# Patient Record
Sex: Female | Born: 1955 | Race: White | Hispanic: No | Marital: Married | Smoking: Never smoker
Health system: Southern US, Community
[De-identification: ages and names within clinical notes are randomized; demographics above are authoritative.]

## PROBLEM LIST (undated history)

## (undated) DIAGNOSIS — M1990 Unspecified osteoarthritis, unspecified site: Secondary | ICD-10-CM

## (undated) DIAGNOSIS — R002 Palpitations: Secondary | ICD-10-CM

## (undated) DIAGNOSIS — D649 Anemia, unspecified: Secondary | ICD-10-CM

## (undated) DIAGNOSIS — E785 Hyperlipidemia, unspecified: Secondary | ICD-10-CM

## (undated) DIAGNOSIS — G4700 Insomnia, unspecified: Secondary | ICD-10-CM

## (undated) DIAGNOSIS — Z9889 Other specified postprocedural states: Secondary | ICD-10-CM

## (undated) DIAGNOSIS — F419 Anxiety disorder, unspecified: Secondary | ICD-10-CM

## (undated) DIAGNOSIS — I10 Essential (primary) hypertension: Secondary | ICD-10-CM

## (undated) DIAGNOSIS — G43909 Migraine, unspecified, not intractable, without status migrainosus: Secondary | ICD-10-CM

## (undated) DIAGNOSIS — C50919 Malignant neoplasm of unspecified site of unspecified female breast: Secondary | ICD-10-CM

## (undated) DIAGNOSIS — E663 Overweight: Secondary | ICD-10-CM

## (undated) DIAGNOSIS — I4729 Other ventricular tachycardia: Secondary | ICD-10-CM

## (undated) DIAGNOSIS — K219 Gastro-esophageal reflux disease without esophagitis: Secondary | ICD-10-CM

## (undated) DIAGNOSIS — Z87442 Personal history of urinary calculi: Secondary | ICD-10-CM

## (undated) DIAGNOSIS — I472 Ventricular tachycardia, unspecified: Secondary | ICD-10-CM

## (undated) DIAGNOSIS — N904 Leukoplakia of vulva: Secondary | ICD-10-CM

## (undated) DIAGNOSIS — U071 COVID-19: Secondary | ICD-10-CM

## (undated) DIAGNOSIS — E119 Type 2 diabetes mellitus without complications: Secondary | ICD-10-CM

## (undated) DIAGNOSIS — G8929 Other chronic pain: Secondary | ICD-10-CM

## (undated) DIAGNOSIS — F32A Depression, unspecified: Secondary | ICD-10-CM

## (undated) DIAGNOSIS — M25519 Pain in unspecified shoulder: Secondary | ICD-10-CM

## (undated) DIAGNOSIS — F329 Major depressive disorder, single episode, unspecified: Secondary | ICD-10-CM

## (undated) DIAGNOSIS — R112 Nausea with vomiting, unspecified: Secondary | ICD-10-CM

## (undated) HISTORY — PX: CHOLECYSTECTOMY: SHX55

## (undated) HISTORY — PX: BLADDER SURGERY: SHX569

## (undated) HISTORY — PX: CARPAL TUNNEL RELEASE: SHX101

## (undated) HISTORY — PX: ORIF TIBIA & FIBULA FRACTURES: SHX2131

## (undated) HISTORY — DX: Malignant neoplasm of unspecified site of unspecified female breast: C50.919

## (undated) HISTORY — PX: TONSILLECTOMY: SUR1361

## (undated) HISTORY — PX: CATARACT EXTRACTION: SUR2

---

## 1981-07-13 DIAGNOSIS — R58 Hemorrhage, not elsewhere classified: Secondary | ICD-10-CM

## 1983-04-03 DIAGNOSIS — R58 Hemorrhage, not elsewhere classified: Secondary | ICD-10-CM

## 1998-02-06 ENCOUNTER — Ambulatory Visit (HOSPITAL_BASED_OUTPATIENT_CLINIC_OR_DEPARTMENT_OTHER): Admission: RE | Admit: 1998-02-06 | Discharge: 1998-02-06 | Payer: Self-pay | Admitting: Orthopedic Surgery

## 2000-07-23 ENCOUNTER — Encounter: Payer: Self-pay | Admitting: Urology

## 2000-07-28 ENCOUNTER — Observation Stay (HOSPITAL_COMMUNITY): Admission: RE | Admit: 2000-07-28 | Discharge: 2000-07-29 | Payer: Self-pay | Admitting: Urology

## 2000-12-08 ENCOUNTER — Ambulatory Visit: Admission: RE | Admit: 2000-12-08 | Discharge: 2000-12-08 | Payer: Self-pay | Admitting: Internal Medicine

## 2001-03-26 ENCOUNTER — Other Ambulatory Visit: Admission: RE | Admit: 2001-03-26 | Discharge: 2001-03-26 | Payer: Self-pay | Admitting: Obstetrics and Gynecology

## 2002-11-02 ENCOUNTER — Other Ambulatory Visit: Admission: RE | Admit: 2002-11-02 | Discharge: 2002-11-02 | Payer: Self-pay | Admitting: Obstetrics and Gynecology

## 2003-07-12 ENCOUNTER — Ambulatory Visit (HOSPITAL_COMMUNITY): Admission: RE | Admit: 2003-07-12 | Discharge: 2003-07-12 | Payer: Self-pay | Admitting: Obstetrics and Gynecology

## 2003-07-12 ENCOUNTER — Encounter (INDEPENDENT_AMBULATORY_CARE_PROVIDER_SITE_OTHER): Payer: Self-pay | Admitting: Specialist

## 2004-05-27 ENCOUNTER — Other Ambulatory Visit: Admission: RE | Admit: 2004-05-27 | Discharge: 2004-05-27 | Payer: Self-pay | Admitting: Obstetrics and Gynecology

## 2005-05-27 ENCOUNTER — Other Ambulatory Visit: Admission: RE | Admit: 2005-05-27 | Discharge: 2005-05-27 | Payer: Self-pay | Admitting: Obstetrics and Gynecology

## 2006-05-28 ENCOUNTER — Other Ambulatory Visit: Admission: RE | Admit: 2006-05-28 | Discharge: 2006-05-28 | Payer: Self-pay | Admitting: Obstetrics and Gynecology

## 2006-09-07 ENCOUNTER — Other Ambulatory Visit: Admission: RE | Admit: 2006-09-07 | Discharge: 2006-09-07 | Payer: Self-pay | Admitting: Obstetrics and Gynecology

## 2007-03-22 ENCOUNTER — Other Ambulatory Visit: Admission: RE | Admit: 2007-03-22 | Discharge: 2007-03-22 | Payer: Self-pay | Admitting: Obstetrics and Gynecology

## 2007-11-18 ENCOUNTER — Other Ambulatory Visit: Admission: RE | Admit: 2007-11-18 | Discharge: 2007-11-18 | Payer: Self-pay | Admitting: Obstetrics and Gynecology

## 2008-12-19 ENCOUNTER — Other Ambulatory Visit: Admission: RE | Admit: 2008-12-19 | Discharge: 2008-12-19 | Payer: Self-pay | Admitting: Obstetrics and Gynecology

## 2010-09-06 NOTE — H&P (Signed)
NAME:  Meghan Mcdonald, Meghan Mcdonald                             ACCOUNT NO.:  [id number]   MEDICAL RECORD NO.:  [id number]                   PATIENT TYPE:   LOCATION:                                       FACILITY:   PHYSICIAN:  Artist Pais, M.D.                 DATE OF BIRTH:  06/11/1955   DATE OF ADMISSION:  07/12/2003  DATE OF DISCHARGE:                                HISTORY & PHYSICAL   CURRENT HISTORY:  The patient is a 55 year old Caucasian female, gravida 2,  para 2, referred by Dr. Merril Abbe for irregular menstrual bleeding.  In the  last two years, she has skipped some periods.  She was seen by another  physician and started on __________ progesterone-only oral contraceptive.  She says that she did not really want to take this progesterone-only oral  contraceptive.  Prior to that, she has bled the entire month of July and  part of August and she was quite concerned that any type of hormone could  increase her migraines in frequency and severity.  She did discontinue  hormones, but her bleeding continued for approximately 40 days.  The patient  was advised due to her menometrorrhagia to undergo pelvic ultrasound.  She  had a pelvic ultrasound which revealed her endometrium to be 7 mm and she  was found to have a cyst in her right adnexa.  She subsequently was asked to  undergo a sonohysterogram, which showed a possible wall defect at the right  endometrium.  It was uncertain as to if this was an artifact secondary to  the angle of the endometrial cavity, but the appearance did appear to be  that of a polyp.  She was thus advised to undergo a D&C and hysteroscopy.  Of note, the patient was found to have two small fibroids.  The risks of  surgery, including anesthetic complication, hemorrhage, infection, damage to  adjacent structures, including bladder, bowel, blood vessels, and ureters  was discussed with the patient.  She was made aware of the risk of uterine  perforation which could  result in overwhelming life-threatening hemorrhage  requiring emergent hysterectomy and uterine perforation which could result  in overwhelming life-threatening peritonitis or which could result in  colostomy.  She expressed understanding of and acceptance of these risks.  She does not have any cardiac problems requiring SBE prophylaxis.  We did  obtain surgical clearance from Dr. Merril Abbe for her to have her D&C,  hysteroscopy, and removal of possible endometrial polyp.   PAST OBSTETRICAL AND GYNECOLOGICAL HISTORY:  Contraception:  Vasectomy.  Cycle Duration:  The patient is noted to have menometrorrhagia.  She has had  no abnormal Paps with the exception of inflammation on Pap.   PAST MEDICAL HISTORY:  1. GERD.  2. Migraines.  3. Hypertension.  4. History of gestational diabetes x 2.  5. History of asthma with upper respiratory infections.  6.  Hypercholesterolemia.  7. Sinus tachycardia.  8. Seasonal allergies.   ALLERGIES:  ASPIRIN causes bleeding and easy bruisability.   CURRENT MEDICATIONS:  Prevacid, Allegra, Altace, Toprol, and Zocor.   PAST SURGICAL HISTORY:  1. Bladder sling in 2002.  2. Carpal tunnel release of the right wrist in 1995.  3. Cholecystectomy in 1990.  4. Nevus removal from the inferior portion of the right breast.   FAMILY HISTORY:  There is no family history of colon, breast, ovarian, or  prostate cancer.  The patient's mother is 71 with GERD, asthma, and an  ulcer.  Her father died at 57 of heart disease.  He also had hypertension,  as well as elevated cholesterol and cholelithiasis.  One brother, age 81 is  alive and well.  One sister, age 39, is an insulin-dependent diabetic with a  history of a DVT.  Two children, ages 41 and 32, are doing well.  Her mother  did have an MI and her father had an MI with resulting bypass.  Her mother  also has a history of asthma.   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  She does not smoke.  She drinks four to five glasses  of wine weekly.   REVIEW OF SYSTEMS:  Noncontributory, except as noted above.  Denies  headache, visual changes, chest pain, shortness of breath, abdominal pain,  change in bowel habits, unintentional weight loss, dysuria, urgency,  frequency, vaginal prolapse or discharge, and pain or bleeding with  intercourse.   PHYSICAL EXAMINATION:  GENERAL APPEARANCE:  A well-developed Caucasian  female.  VITAL SIGNS:  Blood pressure 114/86, heart rate 68.  WEIGHT:  150 pounds.  HEENT:  Normal.  NECK:  Supple without thyromegaly, adenopathy, or nodules.  CHEST:  Clear to auscultation.  CARDIAC:  Regular rate and rhythm.  ABDOMEN:  Soft and nontender.  No hepatosplenomegaly or masses.  EXTREMITIES:  No cyanosis, clubbing, or edema.  NEUROLOGIC:  Oriented x 3.  Grossly normal.  PELVIC:  Normal external female genitalia.  No vulvar, vaginal, or cervical  lesions.  Pap smear was deferred as this was recently performed by another  gynecologist.  Bimanual examination reveals the uterus to be 10-12 weeks  size, globular, and consistent with possible adenomyosis.  No adnexal masses  palpated.  RECTAL:  Excellent sphincter tone.  Confirms pelvic exam.  No masses  palpated.   ASSESSMENT AND PLAN:  The patient is a 55 year old Caucasian female with a  history of menometrorrhagia and an endometrial polyp found on  sonohysterogram, admitted for D&C, hysteroscopy, and polypectomy.  The risks  have been explained to the patient.  She experiences understanding of and  acceptance of these risks and desires to proceed with surgery.  Afterward we  will make a plan with respect to managing her menometrorrhagia.                                               Artist Pais, M.D.    DC/MEDQ  D:  07/11/2003  T:  07/11/2003  Job:  [id number]   cc:   [facility]

## 2010-09-06 NOTE — Op Note (Signed)
[facility]  Patient:    Meghan Mcdonald, Meghan Mcdonald                   MRN: [id number] Proc. Date: 07/28/00 Adm. Date:  [id number] Attending:  Nelma Rothman Iii CC:         Lacretia Leigh. Quintella Reichert, M.D.   Operative Report  DIAGNOSIS:  Stress urinary incontinence.  OPERATION PERFORMED:  Placement of pubovaginal sling with Durmatrix, cystourethroscopy and placement of suprapubic tube.  SURGEON:  Gaynelle Arabian, M.D.  ANESTHESIA:  General endotracheal.  ESTIMATED BLOOD LOSS:  100 cc.  TUBES:  14 French microvasive suprapubic tube, fader tip.  COMPLICATIONS:  None.  INDICATIONS FOR PROCEDURE:  Meghan Mcdonald is a lovely 55 year old white female who presents with progressive stress urinary incontinence. She notes that over the last two years she has been wearing pads periodically but now when she coughs or ______ its worsened. She has had to wear multiple pads. She had a very large baby and participates in karate and thinks that this may have worsened the situation. She has no urgency or frequency of significance and no urge incontinence. On urodynamic evaluation, she was found to have normal bladder sensation. She had a high leak point pressure; however, on straining cystogram, it was significant since its well below the inferior border of the pubic symphysis and some beaking of the bladder neck. I think she on discussion has type 2 stress urinary incontinence but may also have a small component of sphincter incompetence. We have tried biofeedback that didnt help. Kegel exercises has corrected it ______, pharmacotherapy and other measures. After understanding risks, benefits, and alternatives, she has elected to proceed with pubovaginal sling and has elected to proceed with Durmatrix.  DESCRIPTION OF PROCEDURE:  The patient was placed in the supine position and after proper general endotracheal anesthesia was placed in the dorsal lithotomy position and prepped and  draped with Betadine in a sterile fashion. The Durmatrix sling was prepared. It was a 2 x 8 cm sling soaked in antibiotic solution and a 2 cm suprapubic incision was made and blunt dissection was carried down to the anterior rectus sheath. A vaginal speculum was placed. The vaginal mucosa was grasped with an Allis clamp and the submucosal area was injected with 10 cc of 1% xylocaine with epinephrine. A midline incision was then made after a 16 French Foley catheter had been placed and the bladder was drained and lateral flaps were created and the endopelvic fascia was perforated bilaterally. The space of Retzius was dissected free. There were no significant adhesions noted to be present and utilizing the Stamey needle, the #1 Prolenes which had been attached to the sling were passed suprapubically. The sling was tacked in the midline superiorly and inferiorly with 4-0 Vicryl suture and noted to be laying smoothly at the urethrovesical junction. The vaginal mucosa was closed with a running locked 2-0 Vicryl suture and the Foley catheter was removed. The cystoscope was placed, the sutures were then tied to themselves and then across to each other very loosely tying it so as to not put tension and the cystoscope was maintained in the urethra at the correct angle so as to not over correct. Thorough irrigation was performed with the suprapubic incision and the suprapubic incision was closed with a running subcuticular 4-0 Vicryl suture. Cystourethroscopy was then performed with 12 degree angle lens and the ACMI scope. Efflux of clear urine was noted bilaterally and a fader tipped microvasive suprapubic tube  was placed under direct vision and secured in place with the Cope mechanism. The suprapubic tube was then sutured to the skin with 3-0 nylon suture and drained. The bladder was drained and urine was clear. A vaginal pack was placed with 2 inch iodoform gauze with bacitracin ointment. The  suprapubic incision was steri-stripped with Benzoin and Steri-Strips, dressed sterilely and the patient was taken to the recovery room stable. DD:  07/28/00 TD:  07/28/00 Job: [id number]

## 2010-09-06 NOTE — Op Note (Signed)
NAME:  Meghan Mcdonald, Meghan Mcdonald                      ACCOUNT NO.:  [id number]   MEDICAL RECORD NO.:  [id number]                   PATIENT TYPE:  AMB   LOCATION:  SDC                                  FACILITY:  [facility]   PHYSICIAN:  Artist Pais, M.D.                 DATE OF BIRTH:  Sep 30, 1955   DATE OF PROCEDURE:  07/12/2003  DATE OF DISCHARGE:                                 OPERATIVE REPORT   PREOPERATIVE DIAGNOSIS:  Menometrorrhagia, endometrial polyp on  sonohysterogram.   POSTOPERATIVE DIAGNOSIS:  Menometrorrhagia, endometrial polyp on  sonohysterogram.   PROCEDURE:  Dilation and curettage, hysteroscopy, polypectomy.   SURGEON:  Artist Pais, M.D.   ANESTHESIA:  General by LMA plus 20 mL of 1% lidocaine paracervical block.   FLUIDS REPLACED:  1000 mL crystalloid.   DRAINS:  None.   COMPLICATIONS:  None.   ESTIMATED BLOOD LOSS:  Minimal.   URINE OUTPUT:  The patient had very minimal urine output on straight cath  because she had urinated prior to coming to the OR.   DESCRIPTION OF PROCEDURE:  The patient was brought to the operating room and  identified on the operating table.  After induction of adequate general  anesthesia by LMA, the patient was placed in the dorsal lithotomy position  and prepped and draped in the usual sterile fashion.  A bimanual examination  was performed after a straight cath of the bladder was performed which  yielded only a few drops of urine as she had urinated prior to coming to the  OR.  The uterus was noted to be mid position and normal size, approximately  six weeks.  The speculum was placed and the anterior lip of the cervix was  infiltrated with 1% lidocaine, 1 mL.  It was then grasped with a single  tooth tenaculum.  The remaining 19 mL of 1% lidocaine were placed for a  paracervical block.  The uterus sounded to 6.5 to 7 cm.  Subsequently, the  cervix was dilated very gently up to a #23 Pratt dilator, dilatation  proceeded very carefully  and smoothly to decrease the risk of uterine  perforation.  The os was noted to track to the left, but dilation proceeded  quite easily.  The ACMI hysteroscope, and using Sorbitol as a distending  medium, a careful and thorough hysteroscopic examination was performed.  Both tubal ostia were identified, there was noted to be copious thickened  endometrium and the endometrial polyp was again identified.  Subsequently,  the scope was withdrawn and  using the serrated curet, the uterus was  curetted in a systematic clockwise fashion with the polyp and copious tissue  obtained.  The Randall stone forceps were placed and additional tissue was  obtained.  I placed the scope again and the endometrium was noted to have  been completely sampled, the polyp was noted to be removed in its entirety.  The uterus had been curetted  until a good crie was heard all around.  At  that point, the procedure was terminated.  The tenaculum was removed, there  was no bleeding from the tenaculum site.  The patient was subsequently  transferred to the recovery room in stable condition after instrument,  sponge, and needle counts were  correct.  She was given a post D&C instruction sheet.  She is to call the  office for any problems.  She is to take either ibuprofen if she can  tolerate or Tylenol as indicated on the sheet for pain.  However, I did not  give her Toradol as she says that aspirin causes bruising.  She is to return  for a postop visit in two weeks.                                               Artist Pais, M.D.    DC/MEDQ  D:  07/12/2003  T:  07/13/2003  Job:  [id number]   cc:   Ike Bene, M.D.  [address]  [place]  [place] [address]  Fax: [phone]

## 2014-10-05 ENCOUNTER — Other Ambulatory Visit: Payer: Self-pay | Admitting: Family Medicine

## 2014-10-05 DIAGNOSIS — J3489 Other specified disorders of nose and nasal sinuses: Secondary | ICD-10-CM

## 2014-10-05 DIAGNOSIS — R221 Localized swelling, mass and lump, neck: Secondary | ICD-10-CM

## 2014-10-18 ENCOUNTER — Other Ambulatory Visit: Payer: Self-pay

## 2014-10-18 ENCOUNTER — Ambulatory Visit
Admission: RE | Admit: 2014-10-18 | Discharge: 2014-10-18 | Disposition: A | Discharge status: Home / Self Care | Payer: BLUE CROSS/BLUE SHIELD | Source: Ambulatory Visit | Attending: Family Medicine | Admitting: Family Medicine

## 2014-10-18 DIAGNOSIS — R221 Localized swelling, mass and lump, neck: Secondary | ICD-10-CM

## 2014-10-18 DIAGNOSIS — J3489 Other specified disorders of nose and nasal sinuses: Secondary | ICD-10-CM

## 2014-10-18 MED ORDER — IOPAMIDOL (ISOVUE-300) INJECTION 61%
75.0000 mL | Freq: Once | INTRAVENOUS | Status: AC | PRN
  Administered 2014-10-18: 75 mL via INTRAVENOUS

## 2015-08-22 ENCOUNTER — Other Ambulatory Visit: Payer: Self-pay | Admitting: Obstetrics & Gynecology

## 2016-09-17 ENCOUNTER — Other Ambulatory Visit (HOSPITAL_COMMUNITY)
Admission: RE | Admit: 2016-09-17 | Discharge: 2016-09-17 | Disposition: A | Discharge status: Home / Self Care | Payer: BLUE CROSS/BLUE SHIELD | Source: Ambulatory Visit | Attending: Obstetrics & Gynecology | Admitting: Obstetrics & Gynecology

## 2016-09-17 ENCOUNTER — Other Ambulatory Visit: Payer: Self-pay | Admitting: Obstetrics & Gynecology

## 2016-09-17 DIAGNOSIS — Z1151 Encounter for screening for human papillomavirus (HPV): Secondary | ICD-10-CM | POA: Insufficient documentation

## 2016-09-17 DIAGNOSIS — Z01419 Encounter for gynecological examination (general) (routine) without abnormal findings: Secondary | ICD-10-CM | POA: Diagnosis present

## 2016-09-19 LAB — CYTOLOGY - PAP
Diagnosis: NEGATIVE
HPV: NOT DETECTED

## 2017-06-04 DIAGNOSIS — H1045 Other chronic allergic conjunctivitis: Secondary | ICD-10-CM | POA: Diagnosis not present

## 2017-06-16 DIAGNOSIS — N183 Chronic kidney disease, stage 3 (moderate): Secondary | ICD-10-CM | POA: Diagnosis not present

## 2017-06-16 DIAGNOSIS — E7800 Pure hypercholesterolemia, unspecified: Secondary | ICD-10-CM | POA: Diagnosis not present

## 2017-06-16 DIAGNOSIS — G43909 Migraine, unspecified, not intractable, without status migrainosus: Secondary | ICD-10-CM | POA: Diagnosis not present

## 2017-06-16 DIAGNOSIS — I10 Essential (primary) hypertension: Secondary | ICD-10-CM | POA: Diagnosis not present

## 2017-07-20 DIAGNOSIS — M25511 Pain in right shoulder: Secondary | ICD-10-CM | POA: Diagnosis not present

## 2017-09-30 DIAGNOSIS — Z01411 Encounter for gynecological examination (general) (routine) with abnormal findings: Secondary | ICD-10-CM | POA: Diagnosis not present

## 2017-09-30 DIAGNOSIS — G5602 Carpal tunnel syndrome, left upper limb: Secondary | ICD-10-CM | POA: Diagnosis not present

## 2017-09-30 DIAGNOSIS — N7689 Other specified inflammation of vagina and vulva: Secondary | ICD-10-CM | POA: Diagnosis not present

## 2017-11-04 DIAGNOSIS — G5602 Carpal tunnel syndrome, left upper limb: Secondary | ICD-10-CM | POA: Diagnosis not present

## 2017-11-06 DIAGNOSIS — G5602 Carpal tunnel syndrome, left upper limb: Secondary | ICD-10-CM | POA: Diagnosis not present

## 2017-11-12 DIAGNOSIS — M79671 Pain in right foot: Secondary | ICD-10-CM | POA: Diagnosis not present

## 2017-11-12 DIAGNOSIS — M722 Plantar fascial fibromatosis: Secondary | ICD-10-CM | POA: Diagnosis not present

## 2017-11-12 DIAGNOSIS — M24811 Other specific joint derangements of right shoulder, not elsewhere classified: Secondary | ICD-10-CM | POA: Diagnosis not present

## 2017-11-18 ENCOUNTER — Encounter: Payer: Self-pay | Admitting: Physical Therapy

## 2017-11-18 ENCOUNTER — Other Ambulatory Visit: Payer: Self-pay

## 2017-11-18 ENCOUNTER — Ambulatory Visit: Payer: BLUE CROSS/BLUE SHIELD | Attending: Family Medicine | Admitting: Physical Therapy

## 2017-11-18 DIAGNOSIS — M79671 Pain in right foot: Secondary | ICD-10-CM | POA: Insufficient documentation

## 2017-11-18 NOTE — Therapy (Addendum)
[facility] [place], [place], [address] Phone: [phone]   Fax:  [phone]  Physical Therapy Evaluation  Patient Details  Name: Meghan Mcdonald MRN: [id number] Date of Birth: 04-Dec-1955 Referring Provider: Rhina Brackett MD.   Encounter Date: 11/18/2017  PT End of Session - 11/18/17 0927    Visit Number  1    Number of Visits  12    Date for PT Re-Evaluation  12/30/17    PT Start Time  0902    PT Stop Time  0950    PT Time Calculation (min)  48 min    Activity Tolerance  Patient tolerated treatment well    Behavior During Therapy  [facility] for tasks assessed/performed       History reviewed. No pertinent past medical history.  History reviewed. No pertinent surgical history.  There were no vitals filed for this visit.   Subjective Assessment - 11/18/17 0956    Subjective  The patient has ongoing right heel pain.  Pain out of boot is a 7/10 which she came out on her own recently.  Her pain in boot at rest is a 2/10 described as a dull ache.      Pertinent History  Previous right Achilles pain/strain.    How long can you walk comfortably?  Can walk with CAM boot.    Patient Stated Goals  Walk without boot without pain.    Currently in Pain?  Yes    Pain Score  2     Pain Location  Heel    Pain Orientation  Right    Pain Descriptors / Indicators  Dull;Aching    Pain Onset  More than a month ago    Pain Frequency  Constant    Aggravating Factors   Walking barefoot.    Pain Relieving Factors  Resting in boot.         [facility] PT Assessment - 11/18/17 0001      Assessment   Medical Diagnosis  Right plantar fasciitis/retrocalcaneal bursitis.    Referring Provider  Rhina Brackett MD.    Onset Date/Surgical Date  -- Ongoing.      Precautions   Precautions  None      Restrictions   Weight Bearing Restrictions  No      Balance Screen   Has the patient fallen in the past 6 months  No    Has the patient had a  decrease in activity level because of a fear of falling?   No    Is the patient reluctant to leave their home because of a fear of falling?   No      Home Film/video editor residence      Prior Function   Level of Independence  Independent      ROM / Strength   AROM / PROM / Strength  AROM;Strength      AROM   Overall AROM Comments  Right ankle dorsiflexion with knee in full extension= 5 degrees and with knee flexed 10 degrees.      Strength   Overall Strength Comments  Normal right ankle/foot strength.      Palpation   Palpation comment  Tender at right Calcaneal attachment of Achilles tendon and over medial calcaneal tubercle.      Ambulation/Gait   Gait Comments  Gait in right CAM boot.                Objective measurements completed on  examination: See above findings.      [facility] Adult PT Treatment/Exercise - 11/18/17 0001      Modalities   Modalities  Electrical Stimulation;Vasopneumatic      Electrical Stimulation   Electrical Stimulation Location  Right distal Achilles and medial heel area (plantar fascia).    Electrical Stimulation Action  Pre-mod    Electrical Stimulation Parameters  80-150 Hz x 15 minutes.    Electrical Stimulation Goals  Pain      Vasopneumatic   Number Minutes Vasopneumatic   15 minutes    Vasopnuematic Location   -- Right foot/heel.    Vasopneumatic Pressure  Medium             PT Education - 11/18/17 1248    Education Details  Instruct also in short duration ice massage.          PT Long Term Goals - 11/18/17 1154      PT LONG TERM GOAL #1   Title  Independent with an HEP.    Time  6    Period  Weeks    Status  New      PT LONG TERM GOAL #2   Title  Increase right ankle dorsiflexion to 8-10 degrees to normalize the patient's gait pattern.    Time  6    Period  Weeks    Status  New      PT LONG TERM GOAL #3   Title  Walk a community distance with pain not > 2-3/10.    Time  6     Period  Weeks    Status  New             Plan - 11/18/17 1149    Clinical Impression Statement  The patient presents to OPPT with c/o right heel pain.  She is tender to palpation over her distal Achilles and medial calcaneal tubercle area.  She has a minimal loss of dorsiflexion.  She requires a CAM boot to walk with miniaml pain.  Patient will benefit from skilled physical therapy intervention to address deficits.    History and Personal Factors relevant to plan of care:  H/o right Achilles pain/strain.    Clinical Presentation  Evolving    Clinical Presentation due to:  Not improving.    Clinical Decision Making  Low    Rehab Potential  Excellent    PT Frequency  2x / week    PT Duration  6 weeks    PT Treatment/Interventions  ADLs/Self Care Home Management;Cryotherapy;Electrical Stimulation;Ultrasound;Moist Heat;Therapeutic activities;Therapeutic exercise;Patient/family education;Manual techniques;Passive range of motion;Vasopneumatic Device    PT Next Visit Plan  Pulsed combo e'stim/U/S to patient's right heel; IASTM to heel, plantar fascia and calf; Rockerboard; vaso and e'stim.    Consulted and Agree with Plan of Care  Patient       Patient will benefit from skilled therapeutic intervention in order to improve the following deficits and impairments:  Decreased activity tolerance, Pain, Decreased range of motion  Visit Diagnosis: Pain in right foot - Plan: PT plan of care cert/re-cert     Problem List There are no active problems to display for this patient.   Lindsee Labarre, [place] MPT 11/18/2017, 12:48 PM  [facility] [address] [place], [place], [address] Phone: [phone]   Fax:  [phone]  Name: Meghan Mcdonald MRN: [id number] Date of Birth: May 13, 1955

## 2017-11-19 DIAGNOSIS — M25511 Pain in right shoulder: Secondary | ICD-10-CM | POA: Diagnosis not present

## 2017-11-24 ENCOUNTER — Ambulatory Visit: Payer: BLUE CROSS/BLUE SHIELD | Attending: Family Medicine | Admitting: Physical Therapy

## 2017-11-24 ENCOUNTER — Encounter: Payer: Self-pay | Admitting: Physical Therapy

## 2017-11-24 DIAGNOSIS — M79671 Pain in right foot: Secondary | ICD-10-CM | POA: Insufficient documentation

## 2017-11-24 NOTE — Therapy (Signed)
[facility] [place], [place], [address] Phone: [phone]   Fax:  [phone]  Physical Therapy Treatment  Patient Details  Name: Meghan Mcdonald MRN: [id number] Date of Birth: 05/15/1955 Referring Provider: Rhina Brackett MD.   Encounter Date: 11/24/2017  PT End of Session - 11/24/17 1658    Visit Number  2    Number of Visits  12    Date for PT Re-Evaluation  12/30/17    PT Start Time  1602    PT Stop Time  1645    PT Time Calculation (min)  43 min    Activity Tolerance  Patient tolerated treatment well    Behavior During Therapy  [facility] for tasks assessed/performed       History reviewed. No pertinent past medical history.  History reviewed. No pertinent surgical history.  There were no vitals filed for this visit.  Subjective Assessment - 11/24/17 1657    Subjective  Reports she woke with extreme vertigo but her foot is a little better.     Pertinent History  Previous right Achilles pain/strain.    How long can you walk comfortably?  Can walk with CAM boot.    Patient Stated Goals  Walk without boot without pain.    Currently in Pain?  Yes    Pain Score  1     Pain Location  Foot    Pain Orientation  Right    Pain Descriptors / Indicators  Aching    Pain Onset  More than a month ago         [facility] PT Assessment - 11/24/17 0001      Assessment   Medical Diagnosis  Right plantar fasciitis/retrocalcaneal bursitis.      Precautions   Precautions  None      Restrictions   Weight Bearing Restrictions  No                   [facility] Adult PT Treatment/Exercise - 11/24/17 0001      Modalities   Modalities  Electrical Stimulation;Vasopneumatic;Ultrasound      Electrical Stimulation   Electrical Stimulation Location  R PF, posterior calcaneus    Electrical Stimulation Action  Pre-Mod    Electrical Stimulation Parameters  80-150 hz x15 min    Electrical Stimulation Goals  Pain      Ultrasound   Ultrasound Location  R PF, heel    Ultrasound Parameters  1.2 w/cm2, 100%, 1 mhz x10 min    Ultrasound Goals  Pain      Vasopneumatic   Number Minutes Vasopneumatic   15 minutes    Vasopnuematic Location   Ankle    Vasopneumatic Pressure  Low    Vasopneumatic Temperature   34      Manual Therapy   Manual Therapy  Myofascial release    Myofascial Release  IASTW to R PF, heel, achilles tendon to reduce adhesions and promote healing                  PT Long Term Goals - 11/18/17 1154      PT LONG TERM GOAL #1   Title  Independent with an HEP.    Time  6    Period  Weeks    Status  New      PT LONG TERM GOAL #2   Title  Increase right ankle dorsiflexion to 8-10 degrees to normalize the patient's gait pattern.    Time  6  Period  Weeks    Status  New      PT LONG TERM GOAL #3   Title  Walk a community distance with pain not > 2-3/10.    Time  6    Period  Weeks    Status  New            Plan - 11/24/17 1701    Clinical Impression Statement  Patient tolerated today's treatment well with minimal R ankle pain upon arrival. Patient able to tolerate all parts of PT session without complaint of pain only of tenderness with IASTW. Patient's R ankle placed into DF for stretch during IASTW to R PF. Redness response noted with IASTW to R achilles and distal gastrocnemius. Patient educated that soreness may present due to positive IASTW response. Normal modalities response noted following removal of the modalities.    Rehab Potential  Excellent    PT Frequency  2x / week    PT Duration  6 weeks    PT Treatment/Interventions  ADLs/Self Care Home Management;Cryotherapy;Electrical Stimulation;Ultrasound;Moist Heat;Therapeutic activities;Therapeutic exercise;Patient/family education;Manual techniques;Passive range of motion;Vasopneumatic Device    PT Next Visit Plan  Pulsed combo e'stim/U/S to patient's right heel; IASTM to heel, plantar fascia and calf; Rockerboard; vaso  and e'stim.    Consulted and Agree with Plan of Care  Patient       Patient will benefit from skilled therapeutic intervention in order to improve the following deficits and impairments:  Decreased activity tolerance, Pain, Decreased range of motion  Visit Diagnosis: Pain in right foot     Problem List There are no active problems to display for this patient.   Standley Brooking, PTA 11/24/2017, 5:11 PM  [facility] [place], [place], [address] Phone: [phone]   Fax:  [phone]  Name: Meghan Mcdonald MRN: [id number] Date of Birth: 05-Apr-1956

## 2017-11-26 ENCOUNTER — Encounter: Payer: BLUE CROSS/BLUE SHIELD | Admitting: Physical Therapy

## 2017-11-26 DIAGNOSIS — M24811 Other specific joint derangements of right shoulder, not elsewhere classified: Secondary | ICD-10-CM | POA: Diagnosis not present

## 2017-11-26 DIAGNOSIS — M25511 Pain in right shoulder: Secondary | ICD-10-CM | POA: Diagnosis not present

## 2017-11-27 ENCOUNTER — Ambulatory Visit: Payer: BLUE CROSS/BLUE SHIELD | Admitting: Physical Therapy

## 2017-11-27 ENCOUNTER — Encounter: Payer: Self-pay | Admitting: Physical Therapy

## 2017-11-27 DIAGNOSIS — M79671 Pain in right foot: Secondary | ICD-10-CM | POA: Diagnosis not present

## 2017-11-27 NOTE — Therapy (Signed)
[facility] [place], [place], [address] Phone: [phone]   Fax:  [phone]  Physical Therapy Treatment  Patient Details  Name: Meghan Mcdonald MRN: [id number] Date of Birth: [birth year] Referring Provider: Rhina Brackett MD.   Encounter Date: 11/27/2017  PT End of Session - 11/27/17 0938    Visit Number  3    Number of Visits  12    Date for PT Re-Evaluation  12/30/17    PT Start Time  0902    PT Stop Time  0947    PT Time Calculation (min)  45 min    Activity Tolerance  Patient tolerated treatment well    Behavior During Therapy  [facility] for tasks assessed/performed       History reviewed. No pertinent past medical history.  History reviewed. No pertinent surgical history.  There were no vitals filed for this visit.  Subjective Assessment - 11/27/17 0937    Subjective  Patient reports no vertigo symptoms for 3 days but swimmy headed sensation reported during today's treatment. Reports that her foot feels better though.    Pertinent History  Previous right Achilles pain/strain.    How long can you walk comfortably?  Can walk with CAM boot.    Patient Stated Goals  Walk without boot without pain.    Currently in Pain?  Yes    Pain Score  1     Pain Location  Heel    Pain Orientation  Right    Pain Descriptors / Indicators  Discomfort    Pain Onset  More than a month ago         [facility] PT Assessment - 11/27/17 0001      Assessment   Medical Diagnosis  Right plantar fasciitis/retrocalcaneal bursitis.      Precautions   Precautions  None      Restrictions   Weight Bearing Restrictions  No                   [facility] Adult PT Treatment/Exercise - 11/27/17 0001      Modalities   Modalities  Electrical Stimulation;Vasopneumatic;Ultrasound      Electrical Stimulation   Electrical Stimulation Location  R PF, posterior calcaneus    Electrical Stimulation Action  Pre-Mod    Electrical Stimulation  Parameters  80-150 hz x15 min    Electrical Stimulation Goals  Pain      Ultrasound   Ultrasound Location  R PF, heel    Ultrasound Parameters  1.5 w/cm2, 100%,1 mhz x10 min    Ultrasound Goals  Pain      Vasopneumatic   Number Minutes Vasopneumatic   15 minutes    Vasopnuematic Location   Ankle    Vasopneumatic Pressure  Medium    Vasopneumatic Temperature   34      Manual Therapy   Manual Therapy  Myofascial release    Myofascial Release  IASTW to R PF, heel, achilles tendon to reduce adhesions and promote healing                  PT Long Term Goals - 11/27/17 1030      PT LONG TERM GOAL #1   Title  Independent with an HEP.    Time  6    Period  Weeks    Status  On-going      PT LONG TERM GOAL #2   Title  Increase right ankle dorsiflexion to 8-10 degrees to normalize the patient's gait  pattern.    Time  6    Period  Weeks    Status  On-going      PT LONG TERM GOAL #3   Title  Walk a community distance with pain not > 2-3/10.    Time  6    Period  Weeks    Status  On-going            Plan - 11/27/17 1023    Clinical Impression Statement  Patient presented in clinic today with less tenderness to R heel. Patient able to tolerate treatment with reports of less tenderness and overall R heel pain. Patient noted improved R ankle DF as well. Good redness response noted with IASTW to R achilles tendon today. Patient required to stop treatment temporarily secondary to dizziness that occured in supine. Patient was transfered to sitting from supine and cold towel applied to the base of her neck. Patient's symptoms dissipated and she returned to supine with cold towel still applied to base of her neck. Normal modalities response noted following removal of the modalities.    Rehab Potential  Excellent    PT Frequency  2x / week    PT Duration  6 weeks    PT Treatment/Interventions  ADLs/Self Care Home Management;Cryotherapy;Electrical Stimulation;Ultrasound;Moist  Heat;Therapeutic activities;Therapeutic exercise;Patient/family education;Manual techniques;Passive range of motion;Vasopneumatic Device    PT Next Visit Plan  Assess R ankle DF measurement next treatment.    Consulted and Agree with Plan of Care  Patient       Patient will benefit from skilled therapeutic intervention in order to improve the following deficits and impairments:  Decreased activity tolerance, Pain, Decreased range of motion  Visit Diagnosis: Pain in right foot     Problem List There are no active problems to display for this patient.   Standley Brooking, PTA 11/27/2017, 10:30 AM  [facility] [address] [place], [place], [address] Phone: [phone]   Fax:  [phone]  Name: Meghan Mcdonald MRN: [id number] Date of Birth: 01-22-1956

## 2017-12-01 ENCOUNTER — Ambulatory Visit: Payer: BLUE CROSS/BLUE SHIELD | Admitting: Physical Therapy

## 2017-12-01 ENCOUNTER — Encounter: Payer: Self-pay | Admitting: Physical Therapy

## 2017-12-01 DIAGNOSIS — M79671 Pain in right foot: Secondary | ICD-10-CM | POA: Diagnosis not present

## 2017-12-01 NOTE — Therapy (Addendum)
[facility] [place], [place], [address] Phone: [phone]   Fax:  [phone]  Physical Therapy Treatment  Patient Details  Name: Meghan Mcdonald MRN: [id number] Date of Birth: 10-19-1955 Referring Provider: Rhina Brackett MD.   Encounter Date: 12/01/2017  PT End of Session - 12/01/17 1154    Visit Number  4    Number of Visits  12    Date for PT Re-Evaluation  12/30/17    PT Start Time  0902    PT Stop Time  0959    PT Time Calculation (min)  57 min    Activity Tolerance  Patient tolerated treatment well    Behavior During Therapy  [facility] for tasks assessed/performed       History reviewed. No pertinent past medical history.  History reviewed. No pertinent surgical history.  There were no vitals filed for this visit.  Subjective Assessment - 12/01/17 1206    Subjective  I'm doing much better but if im out of the boot too long my pain starts increasing.    Pertinent History  Previous right Achilles pain/strain.    How long can you walk comfortably?  Can walk with CAM boot.    Patient Stated Goals  Walk without boot without pain.    Currently in Pain?  Yes    Pain Score  1     Pain Location  Heel    Pain Orientation  Right    Pain Descriptors / Indicators  Discomfort    Pain Onset  More than a month ago                       [facility] Adult PT Treatment/Exercise - 12/01/17 0001      Modalities   Modalities  Electrical Stimulation      Electrical Stimulation   Electrical Stimulation Location  Right post heel.    Electrical Stimulation Action  Pre-mod.    Electrical Stimulation Parameters  80-150 Hz x 20 minutes.    Electrical Stimulation Goals  Pain      Vasopneumatic   Number Minutes Vasopneumatic   20 minutes    Vasopnuematic Location   --   Right ankle.   Vasopneumatic Pressure  Medium      Manual Therapy   Manual Therapy  Soft tissue mobilization    Soft tissue mobilization  In prone:   STW/M to right calf, Achilles and plantart fascia x 23 minutes which included IASTM.                  PT Long Term Goals - 11/27/17 1030      PT LONG TERM GOAL #1   Title  Independent with an HEP.    Time  6    Period  Weeks    Status  On-going      PT LONG TERM GOAL #2   Title  Increase right ankle dorsiflexion to 8-10 degrees to normalize the patient's gait pattern.    Time  6    Period  Weeks    Status  On-going      PT LONG TERM GOAL #3   Title  Walk a community distance with pain not > 2-3/10.    Time  6    Period  Weeks    Status  On-going            Plan - 12/01/17 1211    Clinical Impression Statement  Patient did very well today  and responded very well to STW/M.  She was found to have trigger points in her right calf musculature that responded very well to IASTM.    PT Treatment/Interventions  ADLs/Self Care Home Management;Cryotherapy;Electrical Stimulation;Ultrasound;Moist Heat;Therapeutic activities;Therapeutic exercise;Patient/family education;Manual techniques;Passive range of motion;Vasopneumatic Device    PT Next Visit Plan  Assess R ankle DF measurement next treatment.    Consulted and Agree with Plan of Care  Patient       Patient will benefit from skilled therapeutic intervention in order to improve the following deficits and impairments:     Visit Diagnosis: Pain in right foot     Problem List There are no active problems to display for this patient.   Solan Vosler, [place] MPT 12/01/2017, 12:12 PM  [facility] [address] [place], [place], [address] Phone: [phone]   Fax:  [phone]  Name: Meghan Mcdonald MRN: [id number] Date of Birth: Oct 23, 1955  PHYSICAL THERAPY DISCHARGE SUMMARY  Visits from Start of Care: 4.  Current functional level related to goals / functional outcomes: See above.   Remaining deficits: See below.   Education / Equipment: HEP.  Plan: Patient agrees  to discharge.  Patient goals were not met. Patient is being discharged due to not returning since the last visit.  ?????         Mali Alwin Lanigan MPT

## 2017-12-03 ENCOUNTER — Encounter: Payer: BLUE CROSS/BLUE SHIELD | Admitting: Physical Therapy

## 2017-12-08 ENCOUNTER — Encounter: Payer: BLUE CROSS/BLUE SHIELD | Admitting: Physical Therapy

## 2017-12-08 DIAGNOSIS — Z1231 Encounter for screening mammogram for malignant neoplasm of breast: Secondary | ICD-10-CM | POA: Diagnosis not present

## 2017-12-08 DIAGNOSIS — M24811 Other specific joint derangements of right shoulder, not elsewhere classified: Secondary | ICD-10-CM | POA: Diagnosis not present

## 2017-12-10 ENCOUNTER — Encounter: Payer: BLUE CROSS/BLUE SHIELD | Admitting: Physical Therapy

## 2017-12-10 DIAGNOSIS — R922 Inconclusive mammogram: Secondary | ICD-10-CM | POA: Diagnosis not present

## 2017-12-14 DIAGNOSIS — I10 Essential (primary) hypertension: Secondary | ICD-10-CM | POA: Diagnosis not present

## 2017-12-14 DIAGNOSIS — E7800 Pure hypercholesterolemia, unspecified: Secondary | ICD-10-CM | POA: Diagnosis not present

## 2017-12-14 DIAGNOSIS — N183 Chronic kidney disease, stage 3 (moderate): Secondary | ICD-10-CM | POA: Diagnosis not present

## 2017-12-22 ENCOUNTER — Emergency Department (HOSPITAL_BASED_OUTPATIENT_CLINIC_OR_DEPARTMENT_OTHER): Payer: BLUE CROSS/BLUE SHIELD

## 2017-12-22 ENCOUNTER — Other Ambulatory Visit: Payer: Self-pay

## 2017-12-22 ENCOUNTER — Encounter (HOSPITAL_BASED_OUTPATIENT_CLINIC_OR_DEPARTMENT_OTHER): Payer: Self-pay

## 2017-12-22 ENCOUNTER — Observation Stay (HOSPITAL_COMMUNITY): Payer: BLUE CROSS/BLUE SHIELD

## 2017-12-22 ENCOUNTER — Observation Stay (HOSPITAL_BASED_OUTPATIENT_CLINIC_OR_DEPARTMENT_OTHER)
Admission: EM | Admit: 2017-12-22 | Discharge: 2017-12-24 | DRG: 066 | Disposition: A | Discharge status: Home / Self Care | Payer: BLUE CROSS/BLUE SHIELD | Attending: Family Medicine | Admitting: Family Medicine

## 2017-12-22 DIAGNOSIS — R42 Dizziness and giddiness: Secondary | ICD-10-CM | POA: Diagnosis not present

## 2017-12-22 DIAGNOSIS — M25519 Pain in unspecified shoulder: Secondary | ICD-10-CM | POA: Diagnosis present

## 2017-12-22 DIAGNOSIS — Z886 Allergy status to analgesic agent status: Secondary | ICD-10-CM

## 2017-12-22 DIAGNOSIS — K219 Gastro-esophageal reflux disease without esophagitis: Secondary | ICD-10-CM | POA: Diagnosis present

## 2017-12-22 DIAGNOSIS — I639 Cerebral infarction, unspecified: Secondary | ICD-10-CM | POA: Diagnosis not present

## 2017-12-22 DIAGNOSIS — H8109 Meniere's disease, unspecified ear: Secondary | ICD-10-CM | POA: Diagnosis present

## 2017-12-22 DIAGNOSIS — Z87442 Personal history of urinary calculi: Secondary | ICD-10-CM | POA: Diagnosis not present

## 2017-12-22 DIAGNOSIS — H5500 Unspecified nystagmus: Secondary | ICD-10-CM | POA: Diagnosis not present

## 2017-12-22 DIAGNOSIS — G8929 Other chronic pain: Secondary | ICD-10-CM | POA: Diagnosis present

## 2017-12-22 DIAGNOSIS — E785 Hyperlipidemia, unspecified: Secondary | ICD-10-CM | POA: Diagnosis present

## 2017-12-22 DIAGNOSIS — Z79899 Other long term (current) drug therapy: Secondary | ICD-10-CM

## 2017-12-22 DIAGNOSIS — R7989 Other specified abnormal findings of blood chemistry: Secondary | ICD-10-CM | POA: Diagnosis not present

## 2017-12-22 DIAGNOSIS — R29700 NIHSS score 0: Secondary | ICD-10-CM | POA: Diagnosis not present

## 2017-12-22 DIAGNOSIS — I10 Essential (primary) hypertension: Secondary | ICD-10-CM | POA: Diagnosis not present

## 2017-12-22 DIAGNOSIS — F329 Major depressive disorder, single episode, unspecified: Secondary | ICD-10-CM | POA: Diagnosis present

## 2017-12-22 DIAGNOSIS — R112 Nausea with vomiting, unspecified: Secondary | ICD-10-CM | POA: Diagnosis present

## 2017-12-22 DIAGNOSIS — E860 Dehydration: Secondary | ICD-10-CM | POA: Diagnosis not present

## 2017-12-22 DIAGNOSIS — H9193 Unspecified hearing loss, bilateral: Secondary | ICD-10-CM | POA: Diagnosis not present

## 2017-12-22 DIAGNOSIS — R945 Abnormal results of liver function studies: Secondary | ICD-10-CM

## 2017-12-22 DIAGNOSIS — R269 Unspecified abnormalities of gait and mobility: Secondary | ICD-10-CM

## 2017-12-22 DIAGNOSIS — J45909 Unspecified asthma, uncomplicated: Secondary | ICD-10-CM | POA: Diagnosis not present

## 2017-12-22 DIAGNOSIS — Z8249 Family history of ischemic heart disease and other diseases of the circulatory system: Secondary | ICD-10-CM | POA: Diagnosis not present

## 2017-12-22 DIAGNOSIS — R51 Headache: Secondary | ICD-10-CM | POA: Diagnosis not present

## 2017-12-22 DIAGNOSIS — Z888 Allergy status to other drugs, medicaments and biological substances status: Secondary | ICD-10-CM

## 2017-12-22 HISTORY — DX: Essential (primary) hypertension: I10

## 2017-12-22 HISTORY — DX: Hyperlipidemia, unspecified: E78.5

## 2017-12-22 HISTORY — DX: Migraine, unspecified, not intractable, without status migrainosus: G43.909

## 2017-12-22 HISTORY — DX: Overweight: E66.3

## 2017-12-22 HISTORY — DX: Pain in unspecified shoulder: M25.519

## 2017-12-22 HISTORY — DX: Major depressive disorder, single episode, unspecified: F32.9

## 2017-12-22 HISTORY — DX: Depression, unspecified: F32.A

## 2017-12-22 HISTORY — DX: Other chronic pain: G89.29

## 2017-12-22 HISTORY — DX: Insomnia, unspecified: G47.00

## 2017-12-22 HISTORY — DX: Palpitations: R00.2

## 2017-12-22 HISTORY — DX: Gastro-esophageal reflux disease without esophagitis: K21.9

## 2017-12-22 LAB — CBC WITH DIFFERENTIAL/PLATELET
Basophils Absolute: 0 10*3/uL (ref 0.0–0.1)
Basophils Relative: 1 %
EOS ABS: 0 10*3/uL (ref 0.0–0.7)
EOS PCT: 1 %
HCT: 50.4 % — ABNORMAL HIGH (ref 36.0–46.0)
Hemoglobin: 18.1 g/dL — ABNORMAL HIGH (ref 12.0–15.0)
LYMPHS ABS: 1.4 10*3/uL (ref 0.7–4.0)
Lymphocytes Relative: 26 %
MCH: 32.7 pg (ref 26.0–34.0)
MCHC: 35.9 g/dL (ref 30.0–36.0)
MCV: 91 fL (ref 78.0–100.0)
MONO ABS: 0.5 10*3/uL (ref 0.1–1.0)
Monocytes Relative: 10 %
NEUTROS PCT: 64 %
Neutro Abs: 3.6 10*3/uL (ref 1.7–7.7)
PLATELETS: 227 10*3/uL (ref 150–400)
RBC: 5.54 MIL/uL — ABNORMAL HIGH (ref 3.87–5.11)
RDW: 13.6 % (ref 11.5–15.5)
WBC: 5.6 10*3/uL (ref 4.0–10.5)

## 2017-12-22 LAB — COMPREHENSIVE METABOLIC PANEL
ALT: 103 U/L — ABNORMAL HIGH (ref 0–44)
AST: 71 U/L — ABNORMAL HIGH (ref 15–41)
Albumin: 4.6 g/dL (ref 3.5–5.0)
Alkaline Phosphatase: 72 U/L (ref 38–126)
Anion gap: 17 — ABNORMAL HIGH (ref 5–15)
BUN: 17 mg/dL (ref 6–20)
CHLORIDE: 94 mmol/L — AB (ref 98–111)
CO2: 26 mmol/L (ref 22–32)
Calcium: 10.2 mg/dL (ref 8.9–10.3)
Creatinine, Ser: 0.77 mg/dL (ref 0.44–1.00)
Glucose, Bld: 131 mg/dL — ABNORMAL HIGH (ref 70–99)
Potassium: 3.1 mmol/L — ABNORMAL LOW (ref 3.5–5.1)
Sodium: 137 mmol/L (ref 135–145)
Total Bilirubin: 1.5 mg/dL — ABNORMAL HIGH (ref 0.3–1.2)
Total Protein: 8.3 g/dL — ABNORMAL HIGH (ref 6.5–8.1)

## 2017-12-22 LAB — TROPONIN I

## 2017-12-22 LAB — PROTIME-INR
INR: 1.02
PROTHROMBIN TIME: 13.3 s (ref 11.4–15.2)

## 2017-12-22 LAB — HEMOGLOBIN A1C
Hgb A1c MFr Bld: 6.9 % — ABNORMAL HIGH (ref 4.8–5.6)
MEAN PLASMA GLUCOSE: 151.33 mg/dL

## 2017-12-22 LAB — TSH: TSH: 3.367 u[IU]/mL (ref 0.350–4.500)

## 2017-12-22 LAB — MAGNESIUM: MAGNESIUM: 1.9 mg/dL (ref 1.7–2.4)

## 2017-12-22 MED ORDER — VENLAFAXINE HCL ER 75 MG PO CP24
75.0000 mg | ORAL_CAPSULE | Freq: Every day | ORAL | Status: DC
  Administered 2017-12-22: 75 mg via ORAL
  Filled 2017-12-22 (×3): qty 1

## 2017-12-22 MED ORDER — SODIUM CHLORIDE 0.9 % IV BOLUS
1000.0000 mL | Freq: Once | INTRAVENOUS | Status: AC
  Administered 2017-12-22: 1000 mL via INTRAVENOUS

## 2017-12-22 MED ORDER — ONDANSETRON 4 MG PO TBDP
4.0000 mg | ORAL_TABLET | Freq: Three times a day (TID) | ORAL | Status: DC | PRN
Start: 2017-12-22 — End: 2017-12-24

## 2017-12-22 MED ORDER — PRAVASTATIN SODIUM 40 MG PO TABS
80.0000 mg | ORAL_TABLET | Freq: Every day | ORAL | Status: DC
Start: 2017-12-22 — End: 2017-12-22

## 2017-12-22 MED ORDER — CARVEDILOL 3.125 MG PO TABS
3.1250 mg | ORAL_TABLET | Freq: Two times a day (BID) | ORAL | Status: DC
  Administered 2017-12-23 – 2017-12-24 (×2): 3.125 mg via ORAL
  Filled 2017-12-22 (×3): qty 1

## 2017-12-22 MED ORDER — LORAZEPAM 2 MG/ML IJ SOLN: 1.0000 mg | Freq: Once | INTRAMUSCULAR | Status: DC | PRN

## 2017-12-22 MED ORDER — HYDRALAZINE HCL 25 MG PO TABS
25.0000 mg | ORAL_TABLET | Freq: Three times a day (TID) | ORAL | Status: DC
  Administered 2017-12-22 – 2017-12-24 (×5): 25 mg via ORAL
  Filled 2017-12-22 (×5): qty 1

## 2017-12-22 MED ORDER — MECLIZINE HCL 25 MG PO TABS
50.0000 mg | ORAL_TABLET | Freq: Three times a day (TID) | ORAL | Status: DC
  Administered 2017-12-22 – 2017-12-24 (×5): 50 mg via ORAL
  Filled 2017-12-22 (×7): qty 2

## 2017-12-22 MED ORDER — HYDRALAZINE HCL 20 MG/ML IJ SOLN: 10.0000 mg | Freq: Four times a day (QID) | INTRAMUSCULAR | Status: DC | PRN

## 2017-12-22 MED ORDER — PROCHLORPERAZINE EDISYLATE 10 MG/2ML IJ SOLN
10.0000 mg | Freq: Once | INTRAMUSCULAR | Status: AC
  Administered 2017-12-22: 10 mg via INTRAVENOUS
  Filled 2017-12-22: qty 2

## 2017-12-22 MED ORDER — POTASSIUM CHLORIDE CRYS ER 20 MEQ PO TBCR
60.0000 meq | EXTENDED_RELEASE_TABLET | Freq: Once | ORAL | Status: AC
Start: 2017-12-22 — End: 2017-12-22
  Administered 2017-12-22: 60 meq via ORAL
  Filled 2017-12-22: qty 3

## 2017-12-22 MED ORDER — DIPHENHYDRAMINE HCL 50 MG/ML IJ SOLN
25.0000 mg | Freq: Once | INTRAMUSCULAR | Status: AC
  Administered 2017-12-22: 25 mg via INTRAVENOUS
  Filled 2017-12-22: qty 1

## 2017-12-22 MED ORDER — POTASSIUM CHLORIDE IN NACL 20-0.9 MEQ/L-% IV SOLN
INTRAVENOUS | Status: DC
  Administered 2017-12-22 – 2017-12-24 (×3): via INTRAVENOUS
  Filled 2017-12-22 (×4): qty 1000

## 2017-12-22 MED ORDER — DIAZEPAM 5 MG/ML IJ SOLN
5.0000 mg | Freq: Once | INTRAMUSCULAR | Status: AC
  Administered 2017-12-22: 5 mg via INTRAVENOUS
  Filled 2017-12-22: qty 2

## 2017-12-22 MED ORDER — SODIUM CHLORIDE 0.9% FLUSH
3.0000 mL | Freq: Two times a day (BID) | INTRAVENOUS | Status: DC
  Administered 2017-12-22 – 2017-12-23 (×2): 3 mL via INTRAVENOUS

## 2017-12-22 MED ORDER — DIAZEPAM 5 MG PO TABS
5.0000 mg | ORAL_TABLET | Freq: Two times a day (BID) | ORAL | Status: DC
  Administered 2017-12-22 – 2017-12-24 (×4): 5 mg via ORAL
  Filled 2017-12-22 (×4): qty 1

## 2017-12-22 MED ORDER — DIAZEPAM 2 MG PO TABS: 2.0000 mg | ORAL_TABLET | Freq: Two times a day (BID) | ORAL | Status: DC | PRN

## 2017-12-22 MED ORDER — SODIUM CHLORIDE 0.9 % IV BOLUS
1000.0000 mL | Freq: Once | INTRAVENOUS | Status: AC
Start: 2017-12-22 — End: 2017-12-22
  Administered 2017-12-22 (×2): 1000 mL via INTRAVENOUS

## 2017-12-22 MED ORDER — FLEET ENEMA 7-19 GM/118ML RE ENEM
1.0000 | ENEMA | Freq: Once | RECTAL | Status: DC | PRN
  Filled 2017-12-22: qty 1

## 2017-12-22 NOTE — ED Triage Notes (Signed)
Pt c/o recurrent vertigo 4 weeks ago and again 8/31-PCP called in zofran and valium-little relief-was seen by PCP today and sent to ED due to n/v and now having hearing loss in right ear-pt NAD-to triage in w/c with sunglasses on

## 2017-12-22 NOTE — ED Provider Notes (Signed)
[facility] EMERGENCY DEPARTMENT Provider Note   CSN: [id number] Arrival date & time: 12/22/17  1115     History   Chief Complaint Chief Complaint  Patient presents with  . Dizziness    HPI Meghan Mcdonald is a 62 y.o. female.  HPI   62 year old female with a history of hypertension, hyperlipidemia presents with concern for dizziness.  Patient reports the symptoms began Saturday night around 2 AM.  Reports that she had sudden onset of severe vertigo.  She later developed a headache, which she describes as similar to her migraines, rated rated 5 out of 10.  Reports associated photophobia, nausea and vomiting.  Reports she has been and unable to eat much over the last several days due to dizziness not feeling well, as well as nausea and vomiting.  She had 4 crackers.  She saw her PCP today who sent her to the emergency department for further evaluation given concern for persistent vertigo.  Patient also reports decreased hearing in the right ear and reports she has been unable to ambulate since this started on Saturday.  Denies numbness, weakness, speech changes, facial droop or other concerns. No double vision or visual field abnormalities-reports vision is moving.  Reports she had an episode of vertigo several weeks ago which was treated as peripheral vertigo with meclizine, and since this started on Saturday, she has been taking meclizine 3 times a day, and was started on Valium by her PCP as well as Zofran.  Despite these treatments, she has remained with severe vertigo.  The vertigo is constant, however does worsen with movements.  Past Medical History:  Diagnosis Date  . Asthma   . Chronic shoulder pain   . Depression   . GERD (gastroesophageal reflux disease)   . Hyperlipemia   . Hypertension   . Insomnia   . Kidney stone   . Migraine   . Overweight   . Rapid palpitations     Patient Active Problem List   Diagnosis Date Noted  . Severe vertigo 12/22/2017    Past  Surgical History:  Procedure Laterality Date  . BLADDER SURGERY    . CARPAL TUNNEL RELEASE    . CHOLECYSTECTOMY    . TONSILLECTOMY       OB History   None      Home Medications    Prior to Admission medications   Not on File    Family History No family history on file.  Social History Social History   Tobacco Use  . Smoking status: Never Smoker  . Smokeless tobacco: Never Used  Substance Use Topics  . Alcohol use: Yes    Comment: weekly  . Drug use: Never     Allergies   Amitriptyline; Aspirin; Hibiclens [chlorhexidine gluconate]; Loestrin [norethindrone acet-ethinyl est]; Micardis [telmisartan]; and Prednisone   Review of Systems Review of Systems  Constitutional: Negative for fever.  HENT: Negative for sore throat.   Eyes: Negative for visual disturbance.  Respiratory: Negative for cough and shortness of breath.   Cardiovascular: Negative for chest pain.  Gastrointestinal: Negative for abdominal pain, nausea and vomiting.  Genitourinary: Negative for difficulty urinating.  Musculoskeletal: Positive for gait problem. Negative for back pain and neck pain.  Skin: Negative for rash.  Neurological: Positive for dizziness. Negative for syncope, weakness, numbness and headaches.     Physical Exam Updated Vital Signs BP (!) 143/89 (BP Location: Right Arm)   Pulse 66   Temp 98.5 F (36.9 C) (Oral)   Resp  14   Ht 5\' 2"  (1.575 m)   Wt 77.1 kg   SpO2 92%   BMI 31.09 kg/m   Physical Exam  Constitutional: She is oriented to person, place, and time. She appears well-developed and well-nourished. No distress.  HENT:  Head: Normocephalic and atraumatic.  Eyes: Conjunctivae and EOM are normal.  Nystagmus with fixed/resting gaze horizontal, question rotational component  Neck: Normal range of motion.  Cardiovascular: Normal rate, regular rhythm, normal heart sounds and intact distal pulses. Exam reveals no gallop and no friction rub.  No murmur  heard. Pulmonary/Chest: Effort normal and breath sounds normal. No respiratory distress. She has no wheezes. She has no rales.  Abdominal: Soft. She exhibits no distension. There is no tenderness. There is no guarding.  Musculoskeletal: She exhibits no edema or tenderness.  Neurological: She is alert and oriented to person, place, and time. She has normal strength. No cranial nerve deficit or sensory deficit. Gait (unable to ambulate) abnormal. Coordination (normal finger to nose and heel to shin) normal.  Skin: Skin is warm and dry. No rash noted. She is not diaphoretic. No erythema.  Nursing note and vitals reviewed.    ED Treatments / Results  Labs (all labs ordered are listed, but only abnormal results are displayed) Labs Reviewed  CBC WITH DIFFERENTIAL/PLATELET - Abnormal; Notable for the following components:      Result Value   RBC 5.54 (*)    Hemoglobin 18.1 (*)    HCT 50.4 (*)    All other components within normal limits  COMPREHENSIVE METABOLIC PANEL - Abnormal; Notable for the following components:   Potassium 3.1 (*)    Chloride 94 (*)    Glucose, Bld 131 (*)    Total Protein 8.3 (*)    AST 71 (*)    ALT 103 (*)    Total Bilirubin 1.5 (*)    Anion gap 17 (*)    All other components within normal limits  MAGNESIUM    EKG None  Radiology Ct Head Wo Contrast  Result Date: 12/22/2017 CLINICAL DATA:  Headache and vertigo with nausea and vomiting EXAM: CT HEAD WITHOUT CONTRAST TECHNIQUE: Contiguous axial images were obtained from the base of the skull through the vertex without intravenous contrast. COMPARISON:  None. FINDINGS: Brain: The ventricles are normal in size and configuration. There is borderline atrophy in the frontal lobe regions bilaterally. There is no intracranial mass, hemorrhage, extra-axial fluid collection, or midline shift. Gray-white compartments appear normal. No evident acute infarct. Vascular: No is no appreciable vascular calcification. No  hyperdense vessel. Skull: The bony calvarium appears intact. Sinuses/Orbits: There is mucosal thickening in several ethmoid air cells. Other visualized paranasal sinuses are clear. Visualized orbits appear symmetric bilaterally. Other: Mastoid air cells are clear. IMPRESSION: Borderline frontal atrophy. Ventricles appear normal. No intracranial mass or hemorrhage. Gray-white compartments appear normal. Mucosal thickening noted in several ethmoid air cells. Electronically Signed   By: Lowella Grip III M.D.   On: 12/22/2017 14:11    Procedures Procedures (including critical care time)  Medications Ordered in ED Medications  sodium chloride 0.9 % bolus 1,000 mL (0 mLs Intravenous Stopped 12/22/17 1545)  diazepam (VALIUM) injection 5 mg (5 mg Intravenous Given 12/22/17 1350)  sodium chloride 0.9 % bolus 1,000 mL (0 mLs Intravenous Stopped 12/22/17 1545)  potassium chloride SA (K-DUR,KLOR-CON) CR tablet 60 mEq (60 mEq Oral Given 12/22/17 1539)  prochlorperazine (COMPAZINE) injection 10 mg (10 mg Intravenous Given 12/22/17 1528)  diphenhydrAMINE (BENADRYL) injection 25 mg (  25 mg Intravenous Given 12/22/17 1528)     Initial Impression / Assessment and Plan / ED Course  I have reviewed the triage vital signs and the nursing notes.  Pertinent labs & imaging results that were available during my care of the patient were reviewed by me and considered in my medical decision making (see chart for details).     62 year old female with a history of hypertension, hyperlipidemia, (pt reports  "paroxysmal ventricular fibrillation"-suspect atrial fibrillation by her history, not on anticoagulation, presents with concern for vertigo.  Some components of history concerning for peripheral cause, however patient with severe gait instability in the ED, unable to ambulate and with nystagmus when vision fixed, looking ahead, concerning for possible central etiology of vertigo.  She has failed outpatient meclizine, valium  and zofran.  Plan to admit for persistent vertigo, consider continued meclizine/valium/PT and obtain MRI to evaluate for central etiologies. Neuro will be consulted if MRI shows CVA or persistent symptoms in hospital despite therapy.    Final Clinical Impressions(s) / ED Diagnoses   Final diagnoses:  Vertigo    ED Discharge Orders    None       Gareth Morgan, MD 12/22/17 1655

## 2017-12-22 NOTE — ED Notes (Signed)
ED Provider at bedside. 

## 2017-12-22 NOTE — Progress Notes (Signed)
Pt transported to ultrasound.

## 2017-12-22 NOTE — ED Notes (Signed)
[facility] arrived to transport pt to [facility] 

## 2017-12-22 NOTE — H&P (Signed)
TRH H&P   Patient Demographics:    Meghan Mcdonald, is a 62 y.o. female  MRN: [id number]   DOB - 1956/03/06  Admit Date - 12/22/2017  Outpatient Primary MD for the patient is Aura Dials, MD  Patient coming from: Home >>[facility]  Chief Complaint  Patient presents with  . Dizziness      HPI:    Meghan Mcdonald  is a 62 y.o. female who is right-handed, with history of essential hypertension, dyslipidemia, asthma, depression, kidney stones, few attacks of intermittent vertigo in the past who about a month ago had a 2 or 3-day episode of dizziness which she describes as things moving around her associated with nausea and vomiting, she also states that she has gradual hearing loss in both ears right more than left, about 3 to 4 days ago she started experiencing another bout of intense and dizziness which she describes as a sensation of things spinning around her with intense nausea vomiting, she at times has to sit on the floor as she feels like she is going to fall down, she took some meclizine without much benefit, she then went to [facility] where her initial work-up.  Including head CT was unremarkable and she was referred here for further work-up.  Patient denies any headache, no focal weakness, no history of head injuries, no history of CVA or TIA, denies any chest pain or palpitations, no diarrhea or dysuria.    Review of systems:    A full 10 point Review of Systems was done, except as stated above, all other Review of Systems were negative.   With Past History of the following :    Past Medical History:  Diagnosis Date  . Asthma   . Chronic shoulder pain   . Depression   . GERD (gastroesophageal  reflux disease)   . Hyperlipemia   . Hypertension   . Insomnia   . Kidney stone   . Migraine   . Overweight   . Rapid palpitations       Past Surgical History:  Procedure Laterality Date  . BLADDER SURGERY    . CARPAL TUNNEL RELEASE    . CHOLECYSTECTOMY    . TONSILLECTOMY        Social History:     Social History   Tobacco Use  .  Smoking status: Never Smoker  . Smokeless tobacco: Never Used  Substance Use Topics  . Alcohol use: Yes    Comment: weekly       Family History :   No History of CVA   Home Medications:   Prior to Admission medications   Medication Sig Start Date End Date Taking? Authorizing Provider  diazepam (VALIUM) 2 MG tablet Take 2 mg by mouth 2 (two) times daily as needed (nausea and dizziness).  12/19/17  Yes [provider]  famotidine (PEPCID) 20 MG tablet Take 20 mg by mouth daily.   Yes [provider]  hydrochlorothiazide (HYDRODIURIL) 25 MG tablet Take 25 mg by mouth daily. 11/27/17  Yes [provider]  lovastatin (MEVACOR) 40 MG tablet Take 40 mg by mouth daily. 11/27/17  Yes [provider]  metoprolol tartrate (LOPRESSOR) 50 MG tablet Take 50 mg by mouth 2 (two) times daily. 11/27/17  Yes [provider]  ondansetron (ZOFRAN-ODT) 4 MG disintegrating tablet Take 4 mg by mouth every 8 (eight) hours as needed for nausea or vomiting.   Yes [provider]  ramipril (ALTACE) 10 MG capsule Take 10 mg by mouth 2 (two) times daily. 12/10/17  Yes [provider]  venlafaxine XR (EFFEXOR-XR) 75 MG 24 hr capsule Take 75 mg by mouth at bedtime.  11/27/17  Yes [provider]     Allergies:     Allergies  Allergen Reactions  . Amitriptyline Other (See Comments)    oversedation  . Aspirin Other (See Comments)    Severe Blood Thinning  . Micardis [Telmisartan] Other (See Comments)    unknown  . Hibiclens [Chlorhexidine Gluconate] Rash    Skin blisters  . Loestrin [Norethindrone  Acet-Ethinyl Est] Rash  . Prednisone Rash     Physical Exam:   Vitals  Blood pressure (!) 174/78, pulse 65, temperature 98.4 F (36.9 C), temperature source Oral, resp. rate 18, height 5\' 2"  (1.575 m), weight 77.1 kg, SpO2 97 %.   1. General he is middle-aged Caucasian female lying in hospital bed in no distress,  2. Normal affect and insight, Not Suicidal or Homicidal, Awake Alert, Oriented X 3.  3. No F.N deficits, ALL C.Nerves Intact, Strength 5/5 all 4 extremities, Sensation intact all 4 extremities, Plantars down going.  4. Ears and Eyes appear Normal, Conjunctivae clear, PERRLA. Moist Oral Mucosa.  Positive nystagmus,  5. Supple Neck, No JVD, No cervical lymphadenopathy appriciated, No Carotid Bruits.  6. Symmetrical Chest wall movement, Good air movement bilaterally, CTAB.  7. RRR, No Gallops, Rubs or Murmurs, No Parasternal Heave.  8. Positive Bowel Sounds, Abdomen Soft, No tenderness, No organomegaly appriciated,No rebound -guarding or rigidity.  9.  No Cyanosis, Normal Skin Turgor, No Skin Rash or Bruise.  10. Good muscle tone,  joints appear normal , no effusions, Normal ROM.  11. No Palpable Lymph Nodes in Neck or Axillae      Data Review:    CBC Recent Labs  Lab 12/22/17 1323  WBC 5.6  HGB 18.1*  HCT 50.4*  PLT 227  MCV 91.0  MCH 32.7  MCHC 35.9  RDW 13.6  LYMPHSABS 1.4  MONOABS 0.5  EOSABS 0.0  BASOSABS 0.0   ------------------------------------------------------------------------------------------------------------------  Chemistries  Recent Labs  Lab 12/22/17 1323 12/22/17 1340  NA 137  --   K 3.1*  --   CL 94*  --   CO2 26  --   GLUCOSE 131*  --   BUN 17  --  CREATININE 0.77  --   CALCIUM 10.2  --   MG  --  1.9  AST 71*  --   ALT 103*  --   ALKPHOS 72  --   BILITOT 1.5*  --    ------------------------------------------------------------------------------------------------------------------ estimated creatinine clearance is  72.8 mL/min (by C-G formula based on SCr of 0.77 mg/dL). ------------------------------------------------------------------------------------------------------------------ No results for input(s): TSH, T4TOTAL, T3FREE, THYROIDAB in the last 72 hours.  Invalid input(s): FREET3  Coagulation profile No results for input(s): INR, PROTIME in the last 168 hours. ------------------------------------------------------------------------------------------------------------------- No results for input(s): DDIMER in the last 72 hours. -------------------------------------------------------------------------------------------------------------------  Cardiac Enzymes No results for input(s): CKMB, TROPONINI, MYOGLOBIN in the last 168 hours.  Invalid input(s): CK ------------------------------------------------------------------------------------------------------------------ No results found for: BNP   ---------------------------------------------------------------------------------------------------------------  Urinalysis No results found for: COLORURINE, APPEARANCEUR, LABSPEC, PHURINE, GLUCOSEU, HGBUR, BILIRUBINUR, KETONESUR, PROTEINUR, UROBILINOGEN, NITRITE, LEUKOCYTESUR  ----------------------------------------------------------------------------------------------------------------   Imaging Results:    Ct Head Wo Contrast  Result Date: 12/22/2017 CLINICAL DATA:  Headache and vertigo with nausea and vomiting EXAM: CT HEAD WITHOUT CONTRAST TECHNIQUE: Contiguous axial images were obtained from the base of the skull through the vertex without intravenous contrast. COMPARISON:  None. FINDINGS: Brain: The ventricles are normal in size and configuration. There is borderline atrophy in the frontal lobe regions bilaterally. There is no intracranial mass, hemorrhage, extra-axial fluid collection, or midline shift. Gray-white compartments appear normal. No evident acute infarct. Vascular: No is no  appreciable vascular calcification. No hyperdense vessel. Skull: The bony calvarium appears intact. Sinuses/Orbits: There is mucosal thickening in several ethmoid air cells. Other visualized paranasal sinuses are clear. Visualized orbits appear symmetric bilaterally. Other: Mastoid air cells are clear. IMPRESSION: Borderline frontal atrophy. Ventricles appear normal. No intracranial mass or hemorrhage. Gray-white compartments appear normal. Mucosal thickening noted in several ethmoid air cells. Electronically Signed   By: Lowella Grip III M.D.   On: 12/22/2017 14:11    My personal review of EKG: Rhythm NSR, no Acute ST changes   Assessment & Plan:     1.  Severe vertigo .  Highly suspicious for Mnire's disease versus recurrent BPPV.  She has classic nystagmus along with bilateral ringing in ears and hearing loss right more than left, at this time MRI brain will be prudent to rule out intracranial pathology with seems less likely, her nystagmus is horizontal, will place her on scheduled high-dose meclizine and Valium along with as needed Zofran.  PT to evaluate in the morning and try vestibular rehab.  Patient and husband has been advised that if her work-up is negative she will be discharged home with home PT and outpatient ENT follow-up.  2.  Elevated LFTs.  Could be due to recurrent nausea vomiting from #1 above, will hold statin, will check hepatitis panel right upper quadrant ultrasound.  Repeat LFTs in the morning.  3.  Mild orthostasis.  This does not worsen her symptoms, this is due to persistent nausea vomiting over the last several days and causing dehydration, hydrate and monitor.  Repeat orthostatics in the morning.  4.  Hypertension.  Placed on Coreg and hydralazine p.o. along with PRN IV hydralazine.  5.  Dyslipidemia.  For now holding statin due to #2 above.  No acute issues.     DVT Prophylaxis  Lovenox   AM Labs Ordered, also please review Full Orders  Family  Communication: Admission, patients condition and plan of care including tests being ordered have been discussed with the patient and husband who indicate understanding and agree  with the plan and Code Status.  Code Status Full  Likely DC to  Home in 24hrs  Condition Fair  Consults called: None    Admission status: Obs    Time spent in minutes : 35   Lala Lund M.D on 12/22/2017 at 6:08 PM  To page go to [email or web address] - password [facility]

## 2017-12-22 NOTE — Progress Notes (Signed)
62 year old female presents with severe vertigo which is unrelenting despite being on meclizine for the last 1 week, no relief with Zofran and Valium, need stroke workup, vitals stable, excepted  to telemetry

## 2017-12-22 NOTE — ED Notes (Signed)
Pt placed on [place] 2L due to SpO2 88% while pt sleeping.

## 2017-12-23 ENCOUNTER — Observation Stay (HOSPITAL_BASED_OUTPATIENT_CLINIC_OR_DEPARTMENT_OTHER): Payer: BLUE CROSS/BLUE SHIELD

## 2017-12-23 ENCOUNTER — Encounter (HOSPITAL_COMMUNITY): Payer: Self-pay | Admitting: Neurology

## 2017-12-23 ENCOUNTER — Observation Stay (HOSPITAL_COMMUNITY): Payer: BLUE CROSS/BLUE SHIELD

## 2017-12-23 DIAGNOSIS — I351 Nonrheumatic aortic (valve) insufficiency: Secondary | ICD-10-CM | POA: Diagnosis not present

## 2017-12-23 DIAGNOSIS — I639 Cerebral infarction, unspecified: Secondary | ICD-10-CM | POA: Diagnosis present

## 2017-12-23 DIAGNOSIS — I63511 Cerebral infarction due to unspecified occlusion or stenosis of right middle cerebral artery: Secondary | ICD-10-CM | POA: Diagnosis not present

## 2017-12-23 DIAGNOSIS — R42 Dizziness and giddiness: Secondary | ICD-10-CM

## 2017-12-23 LAB — URINALYSIS, ROUTINE W REFLEX MICROSCOPIC
Bacteria, UA: NONE SEEN
Bilirubin Urine: NEGATIVE
Glucose, UA: NEGATIVE mg/dL
Hgb urine dipstick: NEGATIVE
Ketones, ur: NEGATIVE mg/dL
Nitrite: NEGATIVE
Protein, ur: NEGATIVE mg/dL
Specific Gravity, Urine: 1.006 (ref 1.005–1.030)
pH: 5 (ref 5.0–8.0)

## 2017-12-23 LAB — HEMOGLOBIN A1C
Hgb A1c MFr Bld: 6.8 % — ABNORMAL HIGH (ref 4.8–5.6)
Mean Plasma Glucose: 148.46 mg/dL

## 2017-12-23 LAB — LIPID PANEL
Cholesterol: 223 mg/dL — ABNORMAL HIGH (ref 0–200)
HDL: 42 mg/dL (ref >40)
LDL Cholesterol: 158 mg/dL — ABNORMAL HIGH (ref 0–99)
Total CHOL/HDL Ratio: 5.3 RATIO
Triglycerides: 117 mg/dL (ref <150)
VLDL: 23 mg/dL (ref 0–40)

## 2017-12-23 LAB — COMPREHENSIVE METABOLIC PANEL
ALBUMIN: 3.2 g/dL — AB (ref 3.5–5.0)
ALK PHOS: 47 U/L (ref 38–126)
ALT: 69 U/L — ABNORMAL HIGH (ref 0–44)
ANION GAP: 8 (ref 5–15)
AST: 42 U/L — ABNORMAL HIGH (ref 15–41)
BILIRUBIN TOTAL: 1.1 mg/dL (ref 0.3–1.2)
BUN: 11 mg/dL (ref 6–20)
CALCIUM: 8.9 mg/dL (ref 8.9–10.3)
CO2: 21 mmol/L — AB (ref 22–32)
Chloride: 110 mmol/L (ref 98–111)
Creatinine, Ser: 0.91 mg/dL (ref 0.44–1.00)
GFR calc non Af Amer: >60 mL/min (ref >60)
Glucose, Bld: 105 mg/dL — ABNORMAL HIGH (ref 70–99)
POTASSIUM: 3.7 mmol/L (ref 3.5–5.1)
SODIUM: 139 mmol/L (ref 135–145)
TOTAL PROTEIN: 5.8 g/dL — AB (ref 6.5–8.1)

## 2017-12-23 LAB — CBC
HEMATOCRIT: 43.9 % (ref 36.0–46.0)
HEMOGLOBIN: 14.7 g/dL (ref 12.0–15.0)
MCH: 32 pg (ref 26.0–34.0)
MCHC: 33.5 g/dL (ref 30.0–36.0)
MCV: 95.4 fL (ref 78.0–100.0)
Platelets: 186 10*3/uL (ref 150–400)
RBC: 4.6 MIL/uL (ref 3.87–5.11)
RDW: 13.3 % (ref 11.5–15.5)
WBC: 5.5 10*3/uL (ref 4.0–10.5)

## 2017-12-23 LAB — HIV ANTIBODY (ROUTINE TESTING W REFLEX): HIV SCREEN 4TH GENERATION: NONREACTIVE

## 2017-12-23 LAB — HEPATITIS PANEL, ACUTE
HCV Ab: <0.1 s/co ratio (ref 0.0–0.9)
Hep A IgM: NEGATIVE
Hep B C IgM: NEGATIVE
Hepatitis B Surface Ag: NEGATIVE

## 2017-12-23 LAB — ECHOCARDIOGRAM COMPLETE
Height: 62 in
Weight: 2720 oz

## 2017-12-23 LAB — TROPONIN I: Troponin I: <0.03 ng/mL (ref <0.03)

## 2017-12-23 MED ORDER — HYDROCODONE-ACETAMINOPHEN 5-325 MG PO TABS
1.0000 | ORAL_TABLET | Freq: Four times a day (QID) | ORAL | Status: DC | PRN
  Administered 2017-12-24: 2 via ORAL
  Filled 2017-12-23: qty 2

## 2017-12-23 MED ORDER — ASPIRIN EC 81 MG PO TBEC
81.0000 mg | DELAYED_RELEASE_TABLET | Freq: Every day | ORAL | Status: DC
  Administered 2017-12-23 – 2017-12-24 (×2): 81 mg via ORAL
  Filled 2017-12-23 (×2): qty 1

## 2017-12-23 MED ORDER — ATORVASTATIN CALCIUM 80 MG PO TABS
80.0000 mg | ORAL_TABLET | Freq: Every day | ORAL | Status: DC
  Administered 2017-12-23: 80 mg via ORAL
  Filled 2017-12-23: qty 1

## 2017-12-23 MED ORDER — CLOPIDOGREL BISULFATE 75 MG PO TABS: 75.0000 mg | ORAL_TABLET | Freq: Every day | ORAL | Status: DC

## 2017-12-23 MED ORDER — ASPIRIN 325 MG PO TABS: 325.0000 mg | ORAL_TABLET | Freq: Every day | ORAL | Status: DC

## 2017-12-23 MED ORDER — IOPAMIDOL (ISOVUE-370) INJECTION 76%
50.0000 mL | Freq: Once | INTRAVENOUS | Status: AC | PRN
  Administered 2017-12-23: 50 mL via INTRAVENOUS

## 2017-12-23 MED ORDER — IOPAMIDOL (ISOVUE-370) INJECTION 76%
INTRAVENOUS | Status: AC
  Filled 2017-12-23: qty 50

## 2017-12-23 MED ORDER — CYCLOBENZAPRINE HCL 10 MG PO TABS
10.0000 mg | ORAL_TABLET | Freq: Three times a day (TID) | ORAL | Status: DC | PRN
  Administered 2017-12-24: 10 mg via ORAL
  Filled 2017-12-23: qty 1

## 2017-12-23 NOTE — Consult Note (Addendum)
Neurology Consultation  Reason for Consult: Vertigo Referring Physician: OGBATA   History is obtained from: Patient  HPI: Meghan Mcdonald is a 62 y.o. female with significant history of hyperlipidemia, hypertension, migraines, decreased hearing using hearing aids, tinnitus which she has had for years.  Patient states that she woke approximately 2 AM on Saturday noting that she had vertigo that went right to left to the point where she was throwing up and cannot eat anything for the rest the day.  She states that the only thing that helped depressive symptoms were closing her eyes and staying still however if she open her eyes she still felt the room was moving from right to left.  Sunday this continued to the point where she was not able to eat without having emesis.  Patient called her PCP explain the symptoms and he called in Valium along with Zofran and she took some of her own meclizine.  She did not find any symptomatic treatment, For that reason patient came to the emergency department.  While in the hospital she has been given Valium for symptomatic treatment.  Apparently today she is much improved.  Vestibular rehab has seen her, they felt that she was BPPV negative.  They saw the same thing I did with left beating nystagmus.  As noted above she has had tinnitus and hearing loss for multiple years.  She does note that she feels that the hearing loss in her right ear might be slightly worse and her tinnitus might be slightly more.  While in the hospital she has been given hydralazine, Valium, Ativan with no resolution. Since her husband states that she is significantly better today.  Patient also feels that she is significantly better today.  But still has intermittent double vision along with some right to left vertigo.  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  Patient does not take  aspirin secondary to significant bruising    ROS: A 14 point ROS was performed and is negative except as noted in the HPI.   Past Medical History:  Diagnosis Date  . Asthma   . Chronic shoulder pain   . Depression   . GERD (gastroesophageal reflux disease)   . Hyperlipemia   . Hypertension   . Insomnia   . Kidney stone   . Migraine   . Overweight   . Rapid palpitations      Family History  Problem Relation Age of Onset  . Hypertension Mother   . Hypertension Father     Social History:   reports that she has never smoked. She has never used smokeless tobacco. She reports that she drinks alcohol. She reports that she does not use drugs.  Medications  Current Facility-Administered Medications:  .  0.9 % NaCl with KCl 20 mEq/ L  infusion, , Intravenous, Continuous, Thurnell Lose, MD, Last Rate: 75 mL/hr at 12/23/17 1053 .  atorvastatin (LIPITOR) tablet 80 mg, 80 mg, Oral, q1800, Dana Allan I, MD .  carvedilol (COREG) tablet 3.125 mg, 3.125 mg, Oral, BID WC, Singh, Prashant K, MD .  diazepam (VALIUM) tablet 5 mg, 5 mg, Oral, BID, Thurnell Lose, MD, 5 mg at 12/23/17 1055 .  hydrALAZINE (APRESOLINE) injection 10 mg, 10 mg, Intravenous, Q6H PRN, Thurnell Lose, MD .  hydrALAZINE (APRESOLINE) tablet 25 mg, 25 mg, Oral, Q8H, Candiss Norse, Margaree Mackintosh, MD, 25  mg at 12/23/17 1508 .  iopamidol (ISOVUE-370) 76 % injection, , , ,  .  iopamidol (ISOVUE-370) 76 % injection, , , ,  .  LORazepam (ATIVAN) injection 1 mg, 1 mg, Intravenous, Once PRN, Thurnell Lose, MD .  meclizine (ANTIVERT) tablet 50 mg, 50 mg, Oral, TID, Thurnell Lose, MD, 50 mg at 12/23/17 1509 .  ondansetron (ZOFRAN-ODT) disintegrating tablet 4 mg, 4 mg, Oral, Q8H PRN, Lala Lund K, MD .  sodium chloride flush (NS) 0.9 % injection 3 mL, 3 mL, Intravenous, Q12H, Thurnell Lose, MD, 3 mL at 12/22/17 2118 .  sodium phosphate (FLEET) 7-19 GM/118ML enema 1 enema, 1 enema, Rectal, Once PRN, Candiss Norse,  Margaree Mackintosh, MD .  venlafaxine XR (EFFEXOR-XR) 24 hr capsule 75 mg, 75 mg, Oral, QHS, Thurnell Lose, MD, 75 mg at 12/22/17 2110   Exam: Current vital signs: BP (!) 148/86 (BP Location: Left Arm)   Pulse 65   Temp 97.9 F (36.6 C) (Oral)   Resp 16   Ht 5\' 2"  (1.575 m)   Wt 77.1 kg   SpO2 98%   BMI 31.09 kg/m  Vital signs in last 24 hours: Temp:  [97.9 F (36.6 C)-98.4 F (36.9 C)] 97.9 F (36.6 C) (09/04 1135) Pulse Rate:  [56-66] 65 (09/04 1135) Resp:  [14-18] 16 (09/04 1135) BP: (110-174)/(67-89) 148/86 (09/04 1135) SpO2:  [92 %-98 %] 98 % (09/04 1135)  GENERAL: Awake, alert in NAD HEENT: - Normocephalic and atraumatic, dry mm,  Ext: warm, well perfused, intact peripheral pulses, __ edema  NEURO:  Mental Status: AA&Ox3, speech is clear.  Naming, repetition, fluency, and comprehension intact. Cranial Nerves: PERRL 2 mm/brisk. EOMI--when looking to the left she has clear horizontal nystagmus with fast beat to the left and slow beating to the right when looking to the right she still has left beating nystagmus however it is depressed.  Upon looking vertically she continues to have the same nystagmus.  I did not know any rotatory nystagmus. visual fields full, no facial asymmetry, facial sensation intact, hearing intact, tongue/uvula/soft palate midline,  Motor: 5/5 throughout Tone: is normal and bulk is normal Sensation- Intact to light touch bilaterally Coordination: FTN intact bilaterally, no ataxia in BLE. Gait- deferred    Labs I have reviewed labs in epic and the results pertinent to this consultation are:   CBC    Component Value Date/Time   WBC 5.5 12/23/2017 0534   RBC 4.60 12/23/2017 0534   HGB 14.7 12/23/2017 0534   HCT 43.9 12/23/2017 0534   PLT 186 12/23/2017 0534   MCV 95.4 12/23/2017 0534   MCH 32.0 12/23/2017 0534   MCHC 33.5 12/23/2017 0534   RDW 13.3 12/23/2017 0534   LYMPHSABS 1.4 12/22/2017 1323   MONOABS 0.5 12/22/2017 1323   EOSABS 0.0  12/22/2017 1323   BASOSABS 0.0 12/22/2017 1323    CMP     Component Value Date/Time   NA 139 12/23/2017 0534   K 3.7 12/23/2017 0534   CL 110 12/23/2017 0534   CO2 21 (L) 12/23/2017 0534   GLUCOSE 105 (H) 12/23/2017 0534   BUN 11 12/23/2017 0534   CREATININE 0.91 12/23/2017 0534   CALCIUM 8.9 12/23/2017 0534   PROT 5.8 (L) 12/23/2017 0534   ALBUMIN 3.2 (L) 12/23/2017 0534   AST 42 (H) 12/23/2017 0534   ALT 69 (H) 12/23/2017 0534   ALKPHOS 47 12/23/2017 0534   BILITOT 1.1 12/23/2017 0534   GFRNONAA >60 12/23/2017 0534  GFRAA >60 12/23/2017 0534    Lipid Panel     Component Value Date/Time   CHOL 223 (H) 12/23/2017 1038   TRIG 117 12/23/2017 1038   HDL 42 12/23/2017 1038   CHOLHDL 5.3 12/23/2017 1038   VLDL 23 12/23/2017 1038   LDLCALC 158 (H) 12/23/2017 1038     Imaging I have reviewed the images obtained:  CT-scan of the brain--borderline frontal atrophy.  Ventricles are normal.  No intracranial mass or hemorrhage.  MRI examination of the brain--- probable punctate acute/early subacute infarct in the right occipital lobe.  Very small chronic infarcts within the right inferior cerebellum  CTA of head neck--no large vessel occlusion  I have seen the patient reviewed the above   She has a positive head impulse test with rightward movement as well as fast beating nystagmus to the left in all directions of gaze.  Assessment: 62 year old female with signs/symptoms consistent with a peripheral vertigo.  With her history of tinnitus, I do wonder if this represents Mnire's, but this is not clear and I think that this could represent vestibular neuritis   I discussed steroids with the patient, but she states that she has had bad reactions to steroids in the past and would favor not pursuing them.  I think that this could speed recovery, but would be unlikely to change overall outcome so it is reasonable to hold off.  I am not certain that the finding on the MRI actually  represents acute stroke, and if it does it is purely incidental.  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  Recommendations: - Continue symptomatic treatment - goal LDL to less than 70 - Glucose control - Fall precautions -Antiplatelet therapy with aspirin -vestibular rehab  Roland Rack, MD [facility] [phone]  If 7pm- 7am, please page neurology on call as listed in [place].

## 2017-12-23 NOTE — Progress Notes (Signed)
PROGRESS NOTE    Meghan Mcdonald  IEP:[id number] DOB: 18-Nov-1955 DOA: 12/22/2017 PCP: Aura Dials, MD  Outpatient Specialists:   Brief Narrative: Patient is a 62 year old female with past medical history significant for essential hypertension, dyslipidemia, asthma, depression, kidney stones, hearing loss, tinnitus on few attacks of intermittent vertigo.  Patient was admitted with severe vertigo, with associated nausea and vomiting that started about 4 days ago.  Patient was admitted due to worsening symptoms.  MRI of the brain revealed "Probable punctate acute/early subacute infarct in the right occipital lobe. No associated hemorrhage or mass effect.   Minimal chronic microvascular ischemic changes of white matter.  Very small chronic infarctions within the right inferior cerebellum".  Neurology team has been consulted.  Further management will be as per the neurology team.  Patient's symptoms seem to be improving.  Assessment & Plan:   Principal Problem:   Severe vertigo Active Problems:   Dyslipidemia   Essential hypertension   Nausea & vomiting   Acute CVA (cerebrovascular accident) ([place])     Probable punctate acute/early subacute infarct in the right occipital lobe:  CTA head and neck. Echocardiogram. A1c. Lipid profile. Consult neurology team. Statins. Further management depend on hospital course.   Severe vertigo:  Highly suspicious for Mnire's disease versus recurrent BPPV.  She has classic nystagmus along with bilateral ringing in ears and hearing loss right more than left, at this time MRI brain will be prudent to rule out intracranial pathology with seems less likely, her nystagmus is horizontal, will place her on scheduled high-dose meclizine and Valium along with as needed Zofran.  PT to evaluate in the morning and try vestibular rehab.  Patient and husband has been advised that if her work-up is negative she will be discharged home with home PT and outpatient ENT  follow-up. 12/23/2017: Continue to monitor.  Manage expectantly.  Elevated LFTs:  Hepatitis panel came back negative. AST and ALT trending downwards. Continue to monitor. Possibly related to severe nausea and vomiting. For further management, if liver function remains abnormal persistently.  Mild orthostasis: Likely related to volume depletion.  Hypertension: Placed on Coreg and hydralazine p.o. along with PRN IV hydralazine. 12/23/2017: Continue to optimize.  Dyslipidemia: Continue to hold statins due to elevated LFTs.  DVT Prophylaxis  Lovenox     DVT prophylaxis:  Code Status: Full Family Communication: Husband Disposition Plan: Home eventually   Consultants:   Neurology  Procedures:   None  Antimicrobials:   None   Subjective: Nausea and vomiting have improved. Vertigo is improving.  Objective: Vitals:   12/23/17 0552 12/23/17 1135 12/23/17 1713 12/23/17 1809  BP: 123/72 (!) 148/86 137/73 (!) 144/79  Pulse: (!) 56 65 81 89  Resp: 14 16 16 18   Temp: 98 F (36.7 C) 97.9 F (36.6 C) 98 F (36.7 C) 98.4 F (36.9 C)  TempSrc: Oral Oral Oral Oral  SpO2: 94% 98% 96% 94%  Weight:      Height:       No intake or output data in the 24 hours ending 12/23/17 1828 Filed Weights   12/22/17 1132  Weight: 77.1 kg    Examination:  General exam: Appears calm and comfortable  Respiratory system: Clear to auscultation. Respiratory effort normal. Cardiovascular system: S1 & S2. No pedal edema. Gastrointestinal system: Abdomen is obese, soft and nontender.  Organs are difficult to assess.   Central nervous system: Alert and oriented. No focal neurological deficits. Extremities: Symmetric 5 x 5 power.  Data Reviewed: I have  personally reviewed following labs and imaging studies  CBC: Recent Labs  Lab 12/22/17 1323 12/23/17 0534  WBC 5.6 5.5  NEUTROABS 3.6  --   HGB 18.1* 14.7  HCT 50.4* 43.9  MCV 91.0 95.4  PLT 227 616   Basic Metabolic  Panel: Recent Labs  Lab 12/22/17 1323 12/22/17 1340 12/23/17 0534  NA 137  --  139  K 3.1*  --  3.7  CL 94*  --  110  CO2 26  --  21*  GLUCOSE 131*  --  105*  BUN 17  --  11  CREATININE 0.77  --  0.91  CALCIUM 10.2  --  8.9  MG  --  1.9  --    GFR: Estimated Creatinine Clearance: 64 mL/min (by C-G formula based on SCr of 0.91 mg/dL). Liver Function Tests: Recent Labs  Lab 12/22/17 1323 12/23/17 0534  AST 71* 42*  ALT 103* 69*  ALKPHOS 72 47  BILITOT 1.5* 1.1  PROT 8.3* 5.8*  ALBUMIN 4.6 3.2*   No results for input(s): LIPASE, AMYLASE in the last 168 hours. No results for input(s): AMMONIA in the last 168 hours. Coagulation Profile: Recent Labs  Lab 12/22/17 1824  INR 1.02   Cardiac Enzymes: Recent Labs  Lab 12/22/17 1824 12/22/17 2331 12/23/17 0534  TROPONINI <0.03 <0.03 <0.03   BNP (last 3 results) No results for input(s): PROBNP in the last 8760 hours. HbA1C: Recent Labs    12/22/17 1833 12/23/17 1038  HGBA1C 6.9* 6.8*   CBG: No results for input(s): GLUCAP in the last 168 hours. Lipid Profile: Recent Labs    12/23/17 1038  CHOL 223*  HDL 42  LDLCALC 158*  TRIG 117  CHOLHDL 5.3   Thyroid Function Tests: Recent Labs    12/22/17 1833  TSH 3.367   Anemia Panel: No results for input(s): VITAMINB12, FOLATE, FERRITIN, TIBC, IRON, RETICCTPCT in the last 72 hours. Urine analysis:    Component Value Date/Time   COLORURINE STRAW (A) 12/23/2017 1220   APPEARANCEUR CLEAR 12/23/2017 1220   LABSPEC 1.006 12/23/2017 1220   PHURINE 5.0 12/23/2017 1220   GLUCOSEU NEGATIVE 12/23/2017 1220   HGBUR NEGATIVE 12/23/2017 1220   BILIRUBINUR NEGATIVE 12/23/2017 1220   KETONESUR NEGATIVE 12/23/2017 1220   PROTEINUR NEGATIVE 12/23/2017 1220   NITRITE NEGATIVE 12/23/2017 1220   LEUKOCYTESUR MODERATE (A) 12/23/2017 1220   Sepsis Labs: @LABRCNTIP (procalcitonin:4,lacticidven:4)  )No results found for this or any previous visit (from the past 240  hour(s)).       Radiology Studies: Ct Angio Head W Or Wo Contrast  Result Date: 12/23/2017 CLINICAL DATA:  Punctate right occipital infarct on MRI. EXAM: CT ANGIOGRAPHY HEAD AND NECK TECHNIQUE: Multidetector CT imaging of the head and neck was performed using the standard protocol during bolus administration of intravenous contrast. Multiplanar CT image reconstructions and MIPs were obtained to evaluate the vascular anatomy. Carotid stenosis measurements (when applicable) are obtained utilizing NASCET criteria, using the distal internal carotid diameter as the denominator. CONTRAST:  50 mL Isovue 370 COMPARISON:  Brain MRI 12/22/2017.  No prior angiographic imaging. FINDINGS: CT HEAD FINDINGS Brain: There is no evidence of acute large territory infarct, intracranial hemorrhage, mass, midline shift, hydrocephalus, or extra-axial fluid collection. The punctate right occipital infarct on MRI is not apparent on CT. Small chronic right cerebellar infarcts are again noted. Vascular: Calcified atherosclerosis at the skull base. Skull: No fracture or focal osseous lesion. Sinuses: Visualized paranasal sinuses and mastoid air cells are clear. Orbits: Bilateral  cataract extraction. Review of the MIP images confirms the above findings CTA NECK FINDINGS Aortic arch: Normal variant 4 vessel aortic arch with the left vertebral artery arising just proximal to the left subclavian artery. Widely patent brachiocephalic and subclavian arteries. Right carotid system: Patent with mild calcified and soft plaque at the carotid bifurcation. No stenosis or dissection. Left carotid system: Patent with mild calcified plaque at the common carotid artery origin. No stenosis or dissection. Retropharyngeal course of the mid cervical ICA. Vertebral arteries: Patent without evidence of stenosis or dissection. Strongly dominant left vertebral artery. Skeleton: No acute osseous abnormality or suspicious osseous lesion. Other neck: No evidence  of acute abnormality or mass. Upper chest: No apical lung consolidation or mass. Review of the MIP images confirms the above findings CTA HEAD FINDINGS Anterior circulation: The internal carotid arteries are widely patent from skull base to carotid termini. ACAs and MCAs are patent without evidence of proximal branch occlusion or significant stenosis. No aneurysm is identified. Posterior circulation: The intracranial vertebral arteries are widely patent to the basilar with the left being strongly dominant. Patent PICA and SCA origins are visualized bilaterally. The basilar artery is widely patent. There is a large right posterior communicating artery with fetal origin of the right PCA. Left posterior communicating artery is diminutive or absent. Both PCAs are patent without evidence of significant stenosis. No aneurysm is identified. Venous sinuses: Patent. Asymmetric opacification of the cavernous sinuses without secondary findings to suggest carotid-cavernous fistula or cavernous sinus thrombosis. Anatomic variants: Fetal right PCA. Delayed phase: No abnormal enhancement. Review of the MIP images confirms the above findings IMPRESSION: 1. No large vessel occlusion. 2. Widely patent cervical carotid and vertebral arteries. 3. No evidence of significant intracranial arterial stenosis. Fetal right PCA. Electronically Signed   By: Logan Bores M.D.   On: 12/23/2017 14:07   Ct Head Wo Contrast  Result Date: 12/22/2017 CLINICAL DATA:  Headache and vertigo with nausea and vomiting EXAM: CT HEAD WITHOUT CONTRAST TECHNIQUE: Contiguous axial images were obtained from the base of the skull through the vertex without intravenous contrast. COMPARISON:  None. FINDINGS: Brain: The ventricles are normal in size and configuration. There is borderline atrophy in the frontal lobe regions bilaterally. There is no intracranial mass, hemorrhage, extra-axial fluid collection, or midline shift. Gray-white compartments appear normal.  No evident acute infarct. Vascular: No is no appreciable vascular calcification. No hyperdense vessel. Skull: The bony calvarium appears intact. Sinuses/Orbits: There is mucosal thickening in several ethmoid air cells. Other visualized paranasal sinuses are clear. Visualized orbits appear symmetric bilaterally. Other: Mastoid air cells are clear. IMPRESSION: Borderline frontal atrophy. Ventricles appear normal. No intracranial mass or hemorrhage. Gray-white compartments appear normal. Mucosal thickening noted in several ethmoid air cells. Electronically Signed   By: Lowella Grip III M.D.   On: 12/22/2017 14:11   Ct Angio Neck W Or Wo Contrast  Result Date: 12/23/2017 CLINICAL DATA:  Punctate right occipital infarct on MRI. EXAM: CT ANGIOGRAPHY HEAD AND NECK TECHNIQUE: Multidetector CT imaging of the head and neck was performed using the standard protocol during bolus administration of intravenous contrast. Multiplanar CT image reconstructions and MIPs were obtained to evaluate the vascular anatomy. Carotid stenosis measurements (when applicable) are obtained utilizing NASCET criteria, using the distal internal carotid diameter as the denominator. CONTRAST:  50 mL Isovue 370 COMPARISON:  Brain MRI 12/22/2017.  No prior angiographic imaging. FINDINGS: CT HEAD FINDINGS Brain: There is no evidence of acute large territory infarct, intracranial hemorrhage, mass, midline  shift, hydrocephalus, or extra-axial fluid collection. The punctate right occipital infarct on MRI is not apparent on CT. Small chronic right cerebellar infarcts are again noted. Vascular: Calcified atherosclerosis at the skull base. Skull: No fracture or focal osseous lesion. Sinuses: Visualized paranasal sinuses and mastoid air cells are clear. Orbits: Bilateral cataract extraction. Review of the MIP images confirms the above findings CTA NECK FINDINGS Aortic arch: Normal variant 4 vessel aortic arch with the left vertebral artery arising just  proximal to the left subclavian artery. Widely patent brachiocephalic and subclavian arteries. Right carotid system: Patent with mild calcified and soft plaque at the carotid bifurcation. No stenosis or dissection. Left carotid system: Patent with mild calcified plaque at the common carotid artery origin. No stenosis or dissection. Retropharyngeal course of the mid cervical ICA. Vertebral arteries: Patent without evidence of stenosis or dissection. Strongly dominant left vertebral artery. Skeleton: No acute osseous abnormality or suspicious osseous lesion. Other neck: No evidence of acute abnormality or mass. Upper chest: No apical lung consolidation or mass. Review of the MIP images confirms the above findings CTA HEAD FINDINGS Anterior circulation: The internal carotid arteries are widely patent from skull base to carotid termini. ACAs and MCAs are patent without evidence of proximal branch occlusion or significant stenosis. No aneurysm is identified. Posterior circulation: The intracranial vertebral arteries are widely patent to the basilar with the left being strongly dominant. Patent PICA and SCA origins are visualized bilaterally. The basilar artery is widely patent. There is a large right posterior communicating artery with fetal origin of the right PCA. Left posterior communicating artery is diminutive or absent. Both PCAs are patent without evidence of significant stenosis. No aneurysm is identified. Venous sinuses: Patent. Asymmetric opacification of the cavernous sinuses without secondary findings to suggest carotid-cavernous fistula or cavernous sinus thrombosis. Anatomic variants: Fetal right PCA. Delayed phase: No abnormal enhancement. Review of the MIP images confirms the above findings IMPRESSION: 1. No large vessel occlusion. 2. Widely patent cervical carotid and vertebral arteries. 3. No evidence of significant intracranial arterial stenosis. Fetal right PCA. Electronically Signed   By: Logan Bores M.D.   On: 12/23/2017 14:07   Mr Brain Wo Contrast  Result Date: 12/22/2017 CLINICAL DATA:  62 y/o F; 2-3 days of dizziness. Intermittent vertigo in the past month. Associated nausea and vomiting. EXAM: MRI HEAD WITHOUT CONTRAST TECHNIQUE: Multiplanar, multiecho pulse sequences of the brain and surrounding structures were obtained without intravenous contrast. COMPARISON:  12/22/2017 CT head FINDINGS: Brain: Punctate focus of diffusion hyperintensity within the right occipital lobe may represent an acute/early subacute infarction (series 5, image 54). No acute hemorrhage, hydrocephalus, extra-axial collection or mass lesion. Very small chronic infarcts in the right inferior cerebellum. Few nonspecific T2 FLAIR hyperintensities in subcortical and periventricular white matter are compatible with minimal chronic microvascular ischemic changes for age. Vascular: Normal flow voids. Skull and upper cervical spine: Normal marrow signal. Sinuses/Orbits: Negative. Other: None. IMPRESSION: 1. Probable punctate acute/early subacute infarct in the right occipital lobe. No associated hemorrhage or mass effect. 2. Minimal chronic microvascular ischemic changes of white matter. Very small chronic infarctions within the right inferior cerebellum. These results will be called to the ordering clinician or representative by the Radiologist Assistant, and communication documented in the PACS or zVision Dashboard. Electronically Signed   By: Kristine Garbe M.D.   On: 12/22/2017 20:37   US Abdomen Limited Ruq  Result Date: 12/22/2017 CLINICAL DATA:  Elevated LFTs. EXAM: ULTRASOUND ABDOMEN LIMITED RIGHT UPPER QUADRANT COMPARISON:  None. FINDINGS: Gallbladder: Surgically absent. Common bile duct: Diameter: 4.2 mm Liver: Probable hepatic steatosis. Portal vein is patent on color Doppler imaging with normal direction of blood flow towards the liver. IMPRESSION: Probable hepatic steatosis.  Previous cholecystectomy.  Electronically Signed   By: Dorise Bullion III M.D   On: 12/22/2017 19:39        Scheduled Meds: . atorvastatin  80 mg Oral q1800  . carvedilol  3.125 mg Oral BID WC  . [START ON 12/24/2017] clopidogrel  75 mg Oral Daily  . diazepam  5 mg Oral BID  . hydrALAZINE  25 mg Oral Q8H  . iopamidol      . iopamidol      . meclizine  50 mg Oral TID  . sodium chloride flush  3 mL Intravenous Q12H  . venlafaxine XR  75 mg Oral QHS   Continuous Infusions: . 0.9 % NaCl with KCl 20 mEq / L 75 mL/hr at 12/23/17 1053     LOS: 0 days    Time spent: 35 minutes    Dana Allan, MD  [facility] Pager #: [phone] 7PM-7AM contact night coverage as above

## 2017-12-23 NOTE — Progress Notes (Signed)
Pt transported from CT to ECHO lab.

## 2017-12-23 NOTE — Progress Notes (Signed)
PT at the bedside performing assessment.

## 2017-12-23 NOTE — Care Management Note (Signed)
Case Management Note  Patient Details  Name: Meghan Mcdonald MRN: [id number] Date of Birth: April 06, 1956  Subjective/Objective: 62 y.o female admitted with 3-4 day episode of dizziness and room spinning vertigo, as well nausea vomiting, gradual hearing loss and tinnitus. PMH includes: HTN, HLD, Depression, Asthma, vertigo 1 month ago.                    Action/Plan: CM met with patient to discuss PT recommendations for HHPT. Patient lives at home with spouse, independent with ADLs PTA, with no DME in use. HH preference list provided, with [facility] selected and referral given to [place], [facility] liaison; AVS updated. Patient will need HHPT orders and F2F. Patient indicated spouse could provide transport home and any assistance post transition. CM will continue to follow.   Expected Discharge Date:                  Expected Discharge Plan:  [place]  In-House Referral:  NA  Discharge planning Services  CM Consult  Post Acute Care Choice:  Home Health Choice offered to:  Patient  DME Arranged:  N/A DME Agency:  NA  HH Arranged:  PT [place] Agency:  [place]  Status of Service:  In process, will continue to follow  If discussed at Long Length of Stay Meetings, dates discussed:    Additional Comments:  Midge Minium RN, BSN, NCM-BC, ACM-RN [phone] 12/23/2017, 3:49 PM

## 2017-12-23 NOTE — Evaluation (Signed)
Physical Therapy Evaluation Patient Details Name: Meghan Mcdonald MRN: [id number] DOB: November 26, 1955 Today's Date: 12/23/2017   History of Present Illness  62 y.o female admitted with 3-4 day episode of dizziness and room spinning vertigo, as well nausea vomiting, gradual hearing loss and tinnitus. Went to [place] [facility] where head CT was negative, transferred to [facility] for further workup. PMH includes: HTN, HLD, Depression, Asthma, vertigo 1 month ago.      Clinical Impression  Pt admitted with above diagnosis. Pt currently with functional limitations due to the deficits listed below (see PT Problem List). PTA pt independent with all mobility. Today pt reports her vertigo has subsided however is still light headed and imbalanced. Currently contact guard for safety with OOB mobility. Vestibular assessment performed (see below), BPPV negative, exam findings positive for R sided hypofunction with L beating nystagmus. Did not observe direction changing nystagmus.  Will cont to follow patient pending workup and provide further vestibular exercises as well as progress OOB mobility next PT session.   Pt will benefit from skilled PT to increase their independence and safety with mobility to allow discharge to the venue listed below.     12/23/17 0001  Vestibular Assessment  General Observation Pt reports severe vertigo that began this past saturday evening that was only made worse by laying on R side. Room spinning lasted for 2 days. Pt also reports increase in R sided tinnitus and diminished hearing beginning same time. Reports nausea and vommiting.    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   Positional Testing  Dix-Hallpike Dix-Hallpike Right;Dix-Hallpike Left  Horizontal Canal Testing Horizontal Canal Right;Horizontal Canal Left  Dix-Hallpike Right  Dix-Hallpike Right Duration 30  Dix-Hallpike Right Symptoms Left nystagmus  Dix-Hallpike Left  Dix-Hallpike Left Duration 30  Dix-Hallpike Left Symptoms Left nystagmus  Horizontal Canal Right  Horizontal Canal Right Duration 30  Horizontal Canal Right Symptoms Ageotrophic  Horizontal Canal Left  Horizontal Canal Left Duration 30  Horizontal Canal Left Symptoms Geotrophic  Cognition  Cognition Orientation Level Appropriate for developmental age  Positional Sensitivities  Sit to Supine 1  Supine to Left Side 1  Supine to Right Side 1  Supine to Sitting 1  Right Hallpike 2  Up from Right Hallpike 1  Up from Left Hallpike 1  Head Turning x 5 0  Pivot Right in Standing 0  Rolling Right 2  Rolling Left 0        Follow Up Recommendations Home health PT;Supervision for mobility/OOB(Progression to OP Neuro PT)    Equipment Recommendations  None recommended by PT    Recommendations for Other Services OT consult     Precautions / Restrictions Precautions Precautions: Fall      Mobility  Bed Mobility Overal bed mobility: Modified Independent                Transfers Overall transfer level: Modified independent                  Ambulation/Gait  General Gait Details: defered due to dizziness  Stairs            Wheelchair Mobility    Modified Rankin (Stroke Patients Only)       Balance Overall balance assessment: Needs assistance   Sitting balance-Leahy Scale: Good       Standing balance-Leahy Scale: Poor                               Pertinent  Vitals/Pain      Home Living Family/patient expects to be discharged to:: Private residence Living Arrangements: Spouse/significant other Available Help at Discharge: Family;Available 24 hours/day Type of Home: House       Home Layout: Two level;Able to live on main level with bedroom/bathroom Home Equipment: Gilford Rile - 2 wheels      Prior Function Level of Independence: Independent         Comments: works as Armed forces operational officer, drives, no AD     Hand Dominance        Extremity/Trunk Assessment   Upper Extremity Assessment Upper Extremity Assessment: Overall WFL for tasks assessed    Lower Extremity Assessment Lower Extremity Assessment: Overall WFL for tasks assessed       Communication   Communication: No difficulties  Cognition Arousal/Alertness: Awake/alert Behavior During Therapy: WFL for tasks assessed/performed Overall Cognitive Status: Within Functional Limits for tasks assessed                                        General Comments      Exercises     Assessment/Plan    PT Assessment Patient needs continued PT services  PT Problem List Decreased activity tolerance;Decreased balance;Decreased mobility;Decreased knowledge of use of DME       PT Treatment Interventions DME instruction;Gait training;Functional mobility training;Stair training;Therapeutic activities;Therapeutic exercise;Balance training;Neuromuscular re-education    PT Goals (Current goals can be found in the Care Plan section)  Acute Rehab PT Goals Patient Stated Goal: to feel better PT Goal Formulation: With patient Time For Goal Achievement: 12/30/17 Potential to Achieve Goals: Good    Frequency Min 3X/week   Barriers to discharge        Co-evaluation               AM-PAC PT "6 Clicks" Daily Activity  Outcome Measure Difficulty turning over in bed (including adjusting bedclothes, sheets and blankets)?: A Little Difficulty moving from lying on  back to sitting on the side of the bed? : A Little Difficulty sitting down on and standing up from a chair with arms (e.g., wheelchair, bedside commode, etc,.)?: A Little Help needed moving to and from a bed to chair (including a wheelchair)?: A Little Help needed walking in hospital room?: A Little Help needed climbing 3-5 steps with a railing? : A Little 6 Click Score: 18    End of Session Equipment Utilized During Treatment: Gait belt Activity Tolerance: Patient tolerated treatment well Patient left: in bed   PT Visit Diagnosis: Unsteadiness on feet (R26.81);Dizziness and giddiness (R42);Other symptoms and signs involving the nervous system (R29.898);Difficulty in walking, not elsewhere classified (R26.2)    Time: 0930-1030 PT Time Calculation (min) (ACUTE ONLY): 60 min   Charges:   PT Evaluation $PT Eval Moderate Complexity: 1 Mod PT Treatments $Therapeutic Activity: 8-22 mins       Reinaldo Berber,  PT, DPT Acute Rehab Services Pager: [phone]    Reinaldo Berber 12/23/2017, 11:55 AM

## 2017-12-23 NOTE — Progress Notes (Signed)
Admission note:  Arrival Method: Patient arrived in w/c from [facility]. Mental Orientation:  Alert and oriented x 4. Telemetry: 39M-19, NSR Assessment: see doc flow sheets. Skin: warm, dry and intact. IV: R H infusing, R AC SL. Pain: Denies any pain currently. Tubes: N/A Safety Measures: bed in low position, call bell and phone within reach. Fall Prevention Safety Plan: Reviewed the plan, verbalized understanding. Admission Screening: Complete 6700 Orientation: Patient has been oriented to the unit, staff and to the room.

## 2017-12-23 NOTE — Progress Notes (Signed)
  Echocardiogram 2D Echocardiogram has been performed.  Jennette Dubin 12/23/2017, 2:18 PM

## 2017-12-24 DIAGNOSIS — I639 Cerebral infarction, unspecified: Secondary | ICD-10-CM | POA: Diagnosis not present

## 2017-12-24 DIAGNOSIS — I10 Essential (primary) hypertension: Secondary | ICD-10-CM

## 2017-12-24 DIAGNOSIS — R42 Dizziness and giddiness: Secondary | ICD-10-CM | POA: Diagnosis not present

## 2017-12-24 DIAGNOSIS — E785 Hyperlipidemia, unspecified: Secondary | ICD-10-CM | POA: Diagnosis not present

## 2017-12-24 DIAGNOSIS — R112 Nausea with vomiting, unspecified: Secondary | ICD-10-CM

## 2017-12-24 LAB — CBC WITH DIFFERENTIAL/PLATELET
Abs Immature Granulocytes: 0 10*3/uL (ref 0.0–0.1)
Basophils Absolute: 0.1 10*3/uL (ref 0.0–0.1)
Basophils Relative: 1 %
Eosinophils Absolute: 0.2 10*3/uL (ref 0.0–0.7)
Eosinophils Relative: 3 %
HCT: 43.2 % (ref 36.0–46.0)
Hemoglobin: 14.7 g/dL (ref 12.0–15.0)
Immature Granulocytes: 1 %
Lymphocytes Relative: 36 %
Lymphs Abs: 1.9 10*3/uL (ref 0.7–4.0)
MCH: 32.1 pg (ref 26.0–34.0)
MCHC: 34 g/dL (ref 30.0–36.0)
MCV: 94.3 fL (ref 78.0–100.0)
Monocytes Absolute: 0.6 10*3/uL (ref 0.1–1.0)
Monocytes Relative: 12 %
Neutro Abs: 2.6 10*3/uL (ref 1.7–7.7)
Neutrophils Relative %: 47 %
Platelets: 194 10*3/uL (ref 150–400)
RBC: 4.58 MIL/uL (ref 3.87–5.11)
RDW: 13.3 % (ref 11.5–15.5)
WBC: 5.4 10*3/uL (ref 4.0–10.5)

## 2017-12-24 LAB — HEPATIC FUNCTION PANEL
ALT: 72 U/L — ABNORMAL HIGH (ref 0–44)
AST: 42 U/L — ABNORMAL HIGH (ref 15–41)
Albumin: 3.4 g/dL — ABNORMAL LOW (ref 3.5–5.0)
Alkaline Phosphatase: 55 U/L (ref 38–126)
Bilirubin, Direct: 0.2 mg/dL (ref 0.0–0.2)
Indirect Bilirubin: 1 mg/dL — ABNORMAL HIGH (ref 0.3–0.9)
Total Bilirubin: 1.2 mg/dL (ref 0.3–1.2)
Total Protein: 5.9 g/dL — ABNORMAL LOW (ref 6.5–8.1)

## 2017-12-24 LAB — BASIC METABOLIC PANEL
Anion gap: 9 (ref 5–15)
BUN: 9 mg/dL (ref 6–20)
CO2: 23 mmol/L (ref 22–32)
Calcium: 8.7 mg/dL — ABNORMAL LOW (ref 8.9–10.3)
Chloride: 107 mmol/L (ref 98–111)
Creatinine, Ser: 0.77 mg/dL (ref 0.44–1.00)
GFR calc Af Amer: >60 mL/min (ref >60)
GFR calc non Af Amer: >60 mL/min (ref >60)
Glucose, Bld: 152 mg/dL — ABNORMAL HIGH (ref 70–99)
Potassium: 3.3 mmol/L — ABNORMAL LOW (ref 3.5–5.1)
Sodium: 139 mmol/L (ref 135–145)

## 2017-12-24 MED ORDER — ASPIRIN 81 MG PO TBEC: 81.0000 mg | DELAYED_RELEASE_TABLET | Freq: Every day | ORAL | 0 refills | Status: DC

## 2017-12-24 MED ORDER — ATORVASTATIN CALCIUM 80 MG PO TABS: 80.0000 mg | ORAL_TABLET | Freq: Every day | ORAL | 0 refills | Status: DC

## 2017-12-24 MED ORDER — DIAZEPAM 5 MG PO TABS: 5.0000 mg | ORAL_TABLET | Freq: Three times a day (TID) | ORAL | 0 refills | Status: DC | PRN

## 2017-12-24 MED ORDER — ONDANSETRON HCL 8 MG PO TABS: 8.0000 mg | ORAL_TABLET | Freq: Three times a day (TID) | ORAL | 0 refills | Status: DC | PRN

## 2017-12-24 MED ORDER — MECLIZINE HCL 25 MG PO TABS: 50.0000 mg | ORAL_TABLET | Freq: Three times a day (TID) | ORAL | 0 refills | Status: DC

## 2017-12-24 NOTE — Discharge Summary (Signed)
Physician Discharge Summary  Meghan Mcdonald WUJ:[id number] DOB: 1955-11-20 DOA: 12/22/2017  PCP: Meghan Dials, MD  Admit date: 12/22/2017 Discharge date: 12/24/2017  Admitted From: Home Disposition: Home   Recommendations for Outpatient Follow-up:  1. Follow up with PCP in 1-2 weeks 2. Recheck CMP in light of escalating statin and mild LFT elevation (AST 42, ALT 72, TBili and alk phos wnl.) 3. Follow up with ENT in the next 1-2 weeks 4. Follow up with neurology in the next 6-8 weeks 5. Monitor tolerance to aspirin, crestor, and improvement with vestibular rehabilitation. 6. Needs good glucose control. HbA1c 6.8%. Defer management to PCP.  Home Health: PT Equipment/Devices: None new Discharge Condition: Stable CODE STATUS: Full Diet recommendation: Heart healthy  Brief/Interim Summary: Meghan Mcdonald is a 62 year old female with past medical history significant for essential hypertension, dyslipidemia, asthma, depression, kidney stones, hearing loss, tinnitus and intermittent attacks of vertigo.  Patient was admitted with severe vertigo, with associated nausea and vomiting that started about 4 days ago. MRI of the brain revealed "Probable punctate acute/early subacute infarct in the right occipital lobe. No associated hemorrhage or mass effect.   Minimal chronic microvascular ischemic changes of white matter.  Very small chronic infarctions within the right inferior cerebellum".  Neurology was consulted and felt these findings were incidental. CTA head and neck showed no large vessel occlusion and echocardiogram revealed no cardioembolic source. Physical therapy has worked with her, found left beating nystagmus but negative testing for BPPV. Her symptoms have improved and she is stable for discharge. Discharge Diagnoses:  Principal Problem:   Severe vertigo Active Problems:   Dyslipidemia   Essential hypertension   Nausea & vomiting   Acute CVA (cerebrovascular accident) ([place])  Probable  punctate acute/early subacute infarct in the right occipital lobe:  CTA head and neck neg, echo no CES. Lipids not controlled on mevacor. Discussed at length w/pt and husband. Had myalgias but never myopathy or rhabdo with another statin (unsure of which). Will start lipitor (on insurance coverage). If issues with this, would trial crestor. Follow up with neurology.  Meniere's disease: Long h/o decreased hearing (though higher frequency predominant) and intermittent tinnitus. BPPV testing negative. Possibly also vestibular neuronitis. Pt has allergy to prednisone, so no steroids started.  - Continue thiazide - Continue symptomatic management with meclizine, zofran, valium.  - Continue vestibular rehabilitation at home until improved enough to leave the home safely. Then can go with outpatient PT.  - Outpatient ENT follow-up.  Elevated LFTs: Hepatitis panel came back negative. AST and ALT trending downwards. Continue to monitor. Possibly related to severe nausea and vomiting. - Recheck LFTs at follow up in light of initiation of statin.  Mild orthostasis: Likely related to volume depletion. - Resolved  Hypertension: - Continue home medications  Dyslipidemia: - Start higher intensity statin  Elevated HbA1c: 6.8%.  - Needs PCP follow up  Discharge Instructions Discharge Instructions    Ambulatory referral to Neurology   Complete by:  As directed    An appointment is requested in approximately: 8 weeks   Diet - low sodium heart healthy   Complete by:  As directed    Discharge instructions   Complete by:  As directed    For Meniere's disease, you will need to continue vestibular rehabilitation (home health to be arranged prior to discharge), and follow up with ENT. You can call the ENT office information below or ask your audiologist if they are affiliated with an ENT specialist.  - Take meclizine, zofran, and  valium as needed for symptoms  Due to the evidence of stroke, you nee  to start aspirin and increase to potency of statin for cholesterol. Stop mevacor and start atorvastatin. If you experience any muscle aches with this, discuss that with your doctor who can order rosuvastatin.  - Call [facility] for a follow up appointment for stroke if you don't hear from them in the next 2 weeks.   Increase activity slowly   Complete by:  As directed      Allergies as of 12/24/2017      Reactions   Amitriptyline Other (See Comments)   oversedation   Aspirin Other (See Comments)   Severe Blood Thinning   Micardis [telmisartan] Other (See Comments)   unknown   Hibiclens [chlorhexidine Gluconate] Rash   Skin blisters   Loestrin [norethindrone Acet-ethinyl Est] Rash   Prednisone Rash      Medication List    STOP taking these medications   lovastatin 40 MG tablet Commonly known as:  MEVACOR   ondansetron 4 MG disintegrating tablet Commonly known as:  ZOFRAN-ODT     TAKE these medications   aspirin 81 MG EC tablet Take 1 tablet (81 mg total) by mouth daily. Start taking on:  12/25/2017   atorvastatin 80 MG tablet Commonly known as:  LIPITOR Take 1 tablet (80 mg total) by mouth daily at 6 PM.   diazepam 5 MG tablet Commonly known as:  VALIUM Take 1 tablet (5 mg total) by mouth every 8 (eight) hours as needed (nausea and dizziness). What changed:    medication strength  how much to take  when to take this   famotidine 20 MG tablet Commonly known as:  PEPCID Take 20 mg by mouth daily.   hydrochlorothiazide 25 MG tablet Commonly known as:  HYDRODIURIL Take 25 mg by mouth daily.   meclizine 25 MG tablet Commonly known as:  ANTIVERT Take 2 tablets (50 mg total) by mouth 3 (three) times daily.   metoprolol tartrate 50 MG tablet Commonly known as:  LOPRESSOR Take 50 mg by mouth 2 (two) times daily.   ondansetron 8 MG tablet Commonly known as:  ZOFRAN Take 1 tablet (8 mg total) by mouth every 8 (eight) hours as needed for refractory nausea /  vomiting.   ramipril 10 MG capsule Commonly known as:  ALTACE Take 10 mg by mouth 2 (two) times daily.   venlafaxine XR 75 MG 24 hr capsule Commonly known as:  EFFEXOR-XR Take 75 mg by mouth at bedtime.      Follow-up Information    [facility].   Specialty:  Home Health Services Why:  Physical Therapy Contact information: [address] [place] [address] [phone]        Meghan Dials, MD. Schedule an appointment as soon as possible for a visit in 2 week(s).   Specialty:  Family Medicine Contact information: [place] [address] [phone]        Melida Quitter, MD. Schedule an appointment as soon as possible for a visit in 2 week(s).   Specialty:  Otolaryngology Contact information: [address] [place] [address] [phone]        [facility]. Schedule an appointment as soon as possible for a visit in 6 week(s).   Specialty:  Neurology Contact information: [address] [place] [phone]         Allergies  Allergen Reactions  . Amitriptyline Other (See Comments)    oversedation  . Aspirin Other (See Comments)    Severe Blood Thinning  . Micardis [Telmisartan] Other (See Comments)    unknown  . Hibiclens [Chlorhexidine Gluconate] Rash    Skin blisters  . Loestrin [Norethindrone Acet-Ethinyl Est] Rash  . Prednisone Rash    Consultations:  Neurology  Procedures/Studies: Ct Angio Head W Or Wo Contrast  Result Date: 12/23/2017 CLINICAL DATA:  Punctate right occipital infarct on MRI. EXAM: CT ANGIOGRAPHY HEAD AND NECK TECHNIQUE: Multidetector CT imaging of the head and neck was performed using the standard protocol during bolus administration of intravenous contrast. Multiplanar CT image reconstructions and MIPs were obtained to evaluate the vascular anatomy. Carotid stenosis measurements (when applicable)  are obtained utilizing NASCET criteria, using the distal internal carotid diameter as the denominator. CONTRAST:  50 mL Isovue 370 COMPARISON:  Brain MRI 12/22/2017.  No prior angiographic imaging. FINDINGS: CT HEAD FINDINGS Brain: There is no evidence of acute large territory infarct, intracranial hemorrhage, mass, midline shift, hydrocephalus, or extra-axial fluid collection. The punctate right occipital infarct on MRI is not apparent on CT. Small chronic right cerebellar infarcts are again noted. Vascular: Calcified atherosclerosis at the skull base. Skull: No fracture or focal osseous lesion. Sinuses: Visualized paranasal sinuses and mastoid air cells are clear. Orbits: Bilateral cataract extraction. Review of the MIP images confirms the above findings CTA NECK FINDINGS Aortic arch: Normal variant 4 vessel aortic arch with the left vertebral artery arising just proximal to the left subclavian artery. Widely patent brachiocephalic and subclavian arteries. Right carotid system: Patent with mild calcified and soft plaque at the carotid bifurcation. No stenosis or dissection. Left carotid system: Patent with mild calcified plaque at the common carotid artery origin. No stenosis or dissection. Retropharyngeal course of the mid cervical ICA. Vertebral arteries: Patent without evidence of stenosis or dissection. Strongly dominant left vertebral artery. Skeleton: No acute osseous abnormality or suspicious osseous lesion. Other neck: No evidence of acute abnormality or mass. Upper chest: No apical lung consolidation or mass. Review of the MIP images confirms the above findings CTA HEAD FINDINGS Anterior circulation: The internal carotid arteries are widely patent from skull base to carotid termini. ACAs and MCAs are patent without evidence of proximal branch occlusion or significant stenosis. No aneurysm is identified. Posterior circulation: The intracranial vertebral arteries are widely patent to the basilar with the  left being strongly dominant. Patent PICA and SCA origins are visualized bilaterally. The basilar artery is widely patent. There is a large right posterior communicating artery with fetal origin of the right PCA. Left posterior communicating artery is diminutive or absent. Both PCAs are patent without evidence of significant stenosis. No aneurysm is identified. Venous sinuses: Patent. Asymmetric opacification of the cavernous sinuses without secondary findings to suggest carotid-cavernous fistula or cavernous sinus thrombosis. Anatomic variants: Fetal right PCA. Delayed phase: No abnormal enhancement. Review of the MIP images confirms the above findings IMPRESSION: 1. No large vessel occlusion. 2. Widely patent cervical carotid and vertebral arteries. 3. No evidence of significant intracranial arterial stenosis. Fetal right PCA. Electronically Signed   By: Logan Bores M.D.   On: 12/23/2017 14:07   Ct Head Wo Contrast  Result Date: 12/22/2017 CLINICAL DATA:  Headache and vertigo with nausea and vomiting EXAM: CT HEAD WITHOUT CONTRAST TECHNIQUE: Contiguous axial images were obtained from the base of the skull through the vertex without intravenous contrast. COMPARISON:  None. FINDINGS: Brain: The ventricles are normal in  size and configuration. There is borderline atrophy in the frontal lobe regions bilaterally. There is no intracranial mass, hemorrhage, extra-axial fluid collection, or midline shift. Gray-white compartments appear normal. No evident acute infarct. Vascular: No is no appreciable vascular calcification. No hyperdense vessel. Skull: The bony calvarium appears intact. Sinuses/Orbits: There is mucosal thickening in several ethmoid air cells. Other visualized paranasal sinuses are clear. Visualized orbits appear symmetric bilaterally. Other: Mastoid air cells are clear. IMPRESSION: Borderline frontal atrophy. Ventricles appear normal. No intracranial mass or hemorrhage. Gray-white compartments appear  normal. Mucosal thickening noted in several ethmoid air cells. Electronically Signed   By: Lowella Grip III M.D.   On: 12/22/2017 14:11   Ct Angio Neck W Or Wo Contrast  Result Date: 12/23/2017 CLINICAL DATA:  Punctate right occipital infarct on MRI. EXAM: CT ANGIOGRAPHY HEAD AND NECK TECHNIQUE: Multidetector CT imaging of the head and neck was performed using the standard protocol during bolus administration of intravenous contrast. Multiplanar CT image reconstructions and MIPs were obtained to evaluate the vascular anatomy. Carotid stenosis measurements (when applicable) are obtained utilizing NASCET criteria, using the distal internal carotid diameter as the denominator. CONTRAST:  50 mL Isovue 370 COMPARISON:  Brain MRI 12/22/2017.  No prior angiographic imaging. FINDINGS: CT HEAD FINDINGS Brain: There is no evidence of acute large territory infarct, intracranial hemorrhage, mass, midline shift, hydrocephalus, or extra-axial fluid collection. The punctate right occipital infarct on MRI is not apparent on CT. Small chronic right cerebellar infarcts are again noted. Vascular: Calcified atherosclerosis at the skull base. Skull: No fracture or focal osseous lesion. Sinuses: Visualized paranasal sinuses and mastoid air cells are clear. Orbits: Bilateral cataract extraction. Review of the MIP images confirms the above findings CTA NECK FINDINGS Aortic arch: Normal variant 4 vessel aortic arch with the left vertebral artery arising just proximal to the left subclavian artery. Widely patent brachiocephalic and subclavian arteries. Right carotid system: Patent with mild calcified and soft plaque at the carotid bifurcation. No stenosis or dissection. Left carotid system: Patent with mild calcified plaque at the common carotid artery origin. No stenosis or dissection. Retropharyngeal course of the mid cervical ICA. Vertebral arteries: Patent without evidence of stenosis or dissection. Strongly dominant left  vertebral artery. Skeleton: No acute osseous abnormality or suspicious osseous lesion. Other neck: No evidence of acute abnormality or mass. Upper chest: No apical lung consolidation or mass. Review of the MIP images confirms the above findings CTA HEAD FINDINGS Anterior circulation: The internal carotid arteries are widely patent from skull base to carotid termini. ACAs and MCAs are patent without evidence of proximal branch occlusion or significant stenosis. No aneurysm is identified. Posterior circulation: The intracranial vertebral arteries are widely patent to the basilar with the left being strongly dominant. Patent PICA and SCA origins are visualized bilaterally. The basilar artery is widely patent. There is a large right posterior communicating artery with fetal origin of the right PCA. Left posterior communicating artery is diminutive or absent. Both PCAs are patent without evidence of significant stenosis. No aneurysm is identified. Venous sinuses: Patent. Asymmetric opacification of the cavernous sinuses without secondary findings to suggest carotid-cavernous fistula or cavernous sinus thrombosis. Anatomic variants: Fetal right PCA. Delayed phase: No abnormal enhancement. Review of the MIP images confirms the above findings IMPRESSION: 1. No large vessel occlusion. 2. Widely patent cervical carotid and vertebral arteries. 3. No evidence of significant intracranial arterial stenosis. Fetal right PCA. Electronically Signed   By: Logan Bores M.D.   On: 12/23/2017 14:07  Mr Brain Wo Contrast  Result Date: 12/22/2017 CLINICAL DATA:  62 y/o F; 2-3 days of dizziness. Intermittent vertigo in the past month. Associated nausea and vomiting. EXAM: MRI HEAD WITHOUT CONTRAST TECHNIQUE: Multiplanar, multiecho pulse sequences of the brain and surrounding structures were obtained without intravenous contrast. COMPARISON:  12/22/2017 CT head FINDINGS: Brain: Punctate focus of diffusion hyperintensity within the right  occipital lobe may represent an acute/early subacute infarction (series 5, image 54). No acute hemorrhage, hydrocephalus, extra-axial collection or mass lesion. Very small chronic infarcts in the right inferior cerebellum. Few nonspecific T2 FLAIR hyperintensities in subcortical and periventricular white matter are compatible with minimal chronic microvascular ischemic changes for age. Vascular: Normal flow voids. Skull and upper cervical spine: Normal marrow signal. Sinuses/Orbits: Negative. Other: None. IMPRESSION: 1. Probable punctate acute/early subacute infarct in the right occipital lobe. No associated hemorrhage or mass effect. 2. Minimal chronic microvascular ischemic changes of white matter. Very small chronic infarctions within the right inferior cerebellum. These results will be called to the ordering clinician or representative by the Radiologist Assistant, and communication documented in the PACS or zVision Dashboard. Electronically Signed   By: Kristine Garbe M.D.   On: 12/22/2017 20:37   US Abdomen Limited Ruq  Result Date: 12/22/2017 CLINICAL DATA:  Elevated LFTs. EXAM: ULTRASOUND ABDOMEN LIMITED RIGHT UPPER QUADRANT COMPARISON:  None. FINDINGS: Gallbladder: Surgically absent. Common bile duct: Diameter: 4.2 mm Liver: Probable hepatic steatosis. Portal vein is patent on color Doppler imaging with normal direction of blood flow towards the liver. IMPRESSION: Probable hepatic steatosis.  Previous cholecystectomy. Electronically Signed   By: Dorise Bullion III M.D   On: 12/22/2017 19:39    Subjective: Vertigo better, eating better. No new deficits. Wants to go home.  Discharge Exam: Vitals:   12/24/17 0953 12/24/17 0955  BP: 136/74 (!) 147/82  Pulse: 84 100  Resp: 20 (!) 22  Temp:  98.2 F (36.8 C)  SpO2: 98% 98%   General: Pt is alert, awake, not in acute distress Cardiovascular: RRR, S1/S2 +, no rubs, no gallops Respiratory: CTA bilaterally, no wheezing, no  rhonchi Abdominal: Soft, NT, ND, bowel sounds + Extremities: No edema, no cyanosis Neuro: Alert, oriented, left beating nystagmus and reported diplopia with far left visual field testing though gaze appears conjugate. Negative Epley. No focal weakness, numbness.   Labs: BNP (last 3 results) No results for input(s): BNP in the last 8760 hours. Basic Metabolic Panel: Recent Labs  Lab 12/22/17 1323 12/22/17 1340 12/23/17 0534 12/24/17 0434  NA 137  --  139 139  K 3.1*  --  3.7 3.3*  CL 94*  --  110 107  CO2 26  --  21* 23  GLUCOSE 131*  --  105* 152*  BUN 17  --  11 9  CREATININE 0.77  --  0.91 0.77  CALCIUM 10.2  --  8.9 8.7*  MG  --  1.9  --   --    Liver Function Tests: Recent Labs  Lab 12/22/17 1323 12/23/17 0534 12/24/17 0434  AST 71* 42* 42*  ALT 103* 69* 72*  ALKPHOS 72 47 55  BILITOT 1.5* 1.1 1.2  PROT 8.3* 5.8* 5.9*  ALBUMIN 4.6 3.2* 3.4*   No results for input(s): LIPASE, AMYLASE in the last 168 hours. No results for input(s): AMMONIA in the last 168 hours. CBC: Recent Labs  Lab 12/22/17 1323 12/23/17 0534 12/24/17 0434  WBC 5.6 5.5 5.4  NEUTROABS 3.6  --  2.6  HGB 18.1*  14.7 14.7  HCT 50.4* 43.9 43.2  MCV 91.0 95.4 94.3  PLT 227 186 194   Cardiac Enzymes: Recent Labs  Lab 12/22/17 1824 12/22/17 2331 12/23/17 0534  TROPONINI <0.03 <0.03 <0.03   BNP: Invalid input(s): POCBNP CBG: No results for input(s): GLUCAP in the last 168 hours. D-Dimer No results for input(s): DDIMER in the last 72 hours. Hgb A1c Recent Labs    12/22/17 1833 12/23/17 1038  HGBA1C 6.9* 6.8*   Lipid Profile Recent Labs    12/23/17 1038  CHOL 223*  HDL 42  LDLCALC 158*  TRIG 117  CHOLHDL 5.3   Thyroid function studies Recent Labs    12/22/17 1833  TSH 3.367   Anemia work up No results for input(s): VITAMINB12, FOLATE, FERRITIN, TIBC, IRON, RETICCTPCT in the last 72 hours. Urinalysis    Component Value Date/Time   COLORURINE STRAW (A) 12/23/2017  1220   APPEARANCEUR CLEAR 12/23/2017 1220   LABSPEC 1.006 12/23/2017 1220   PHURINE 5.0 12/23/2017 1220   GLUCOSEU NEGATIVE 12/23/2017 1220   HGBUR NEGATIVE 12/23/2017 1220   BILIRUBINUR NEGATIVE 12/23/2017 1220   KETONESUR NEGATIVE 12/23/2017 1220   PROTEINUR NEGATIVE 12/23/2017 1220   NITRITE NEGATIVE 12/23/2017 1220   LEUKOCYTESUR MODERATE (A) 12/23/2017 1220    Microbiology No results found for this or any previous visit (from the past 240 hour(s)).  Time coordinating discharge: Approximately 40 minutes  Patrecia Pour, MD  [facility] 12/24/2017, 4:32 PM Pager [phone]

## 2017-12-24 NOTE — Progress Notes (Signed)
Notified Durene Fruits [facility] that patient will DC to home today.

## 2017-12-24 NOTE — Progress Notes (Signed)
Meghan Mcdonald to be D/C'd Home per MD order.  Discussed prescriptions and follow up appointments with the patient. Prescriptions given to patient, medication list explained in detail. Pt verbalized understanding.  Allergies as of 12/24/2017      Reactions   Amitriptyline Other (See Comments)   oversedation   Aspirin Other (See Comments)   Severe Blood Thinning   Micardis [telmisartan] Other (See Comments)   unknown   Hibiclens [chlorhexidine Gluconate] Rash   Skin blisters   Loestrin [norethindrone Acet-ethinyl Est] Rash   Prednisone Rash      Medication List    STOP taking these medications   lovastatin 40 MG tablet Commonly known as:  MEVACOR   ondansetron 4 MG disintegrating tablet Commonly known as:  ZOFRAN-ODT     TAKE these medications   aspirin 81 MG EC tablet Take 1 tablet (81 mg total) by mouth daily. Start taking on:  12/25/2017   atorvastatin 80 MG tablet Commonly known as:  LIPITOR Take 1 tablet (80 mg total) by mouth daily at 6 PM.   diazepam 5 MG tablet Commonly known as:  VALIUM Take 1 tablet (5 mg total) by mouth every 8 (eight) hours as needed (nausea and dizziness). What changed:    medication strength  how much to take  when to take this   famotidine 20 MG tablet Commonly known as:  PEPCID Take 20 mg by mouth daily.   hydrochlorothiazide 25 MG tablet Commonly known as:  HYDRODIURIL Take 25 mg by mouth daily.   meclizine 25 MG tablet Commonly known as:  ANTIVERT Take 2 tablets (50 mg total) by mouth 3 (three) times daily.   metoprolol tartrate 50 MG tablet Commonly known as:  LOPRESSOR Take 50 mg by mouth 2 (two) times daily.   ondansetron 8 MG tablet Commonly known as:  ZOFRAN Take 1 tablet (8 mg total) by mouth every 8 (eight) hours as needed for refractory nausea / vomiting.   ramipril 10 MG capsule Commonly known as:  ALTACE Take 10 mg by mouth 2 (two) times daily.   venlafaxine XR 75 MG 24 hr capsule Commonly known as:   EFFEXOR-XR Take 75 mg by mouth at bedtime.       Vitals:   12/24/17 0953 12/24/17 0955  BP: 136/74 (!) 147/82  Pulse: 84 100  Resp: 20 (!) 22  Temp:  98.2 F (36.8 C)  SpO2: 98% 98%    Skin clean, dry and intact without evidence of skin break down, no evidence of skin tears noted. IV catheter discontinued intact. Site without signs and symptoms of complications. Dressing and pressure applied. Pt denies pain at this time. No complaints noted.  An After Visit Summary was printed and given to the patient. Patient escorted via [place], and D/C home via private auto.  Aneta Mins BSN, RN

## 2017-12-24 NOTE — Progress Notes (Signed)
Subjective: Felling much better and able to walk to the bathroom and no Emesis  Exam: Vitals:   12/24/17 0953 12/24/17 0955  BP: 136/74 (!) 147/82  Pulse: 84 100  Resp: 20 (!) 22  Temp:  98.2 F (36.8 C)  SpO2: 98% 98%    Physical Exam   HEENT-  Normocephalic, no lesions, without obvious abnormality.  Normal external eye and conjunctiva.   Extremities- Warm, dry and intact Musculoskeletal-no joint tenderness, deformity or swelling Skin-warm and dry, no hyperpigmentation, vitiligo, or suspicious lesions    Neuro:  Mental Status: Alert, oriented, thought content appropriate.  Speech fluent without evidence of aphasia.  Able to follow 3 step commands without difficulty. Cranial Nerves: II:  Visual fields grossly normal,  III,IV, DJ:MEQA--STMH looking to the left she has clear horizontal nystagmus with fast beat to the left and slow beating to the right when looking to the right she still has left beating nystagmus however it is depressed.  Upon looking vertically she continues to have the same nystagmus. Seeing double when looking to the left  V,VII: smile symmetric, facial light touch sensation normal bilaterally VIII: hearing normal bilaterally IX,X: uvula rises midline XI: bilateral shoulder shrug XII: midline tongue extension Motor: Right : Upper extremity   5/5    Left:     Upper extremity   5/5  Lower extremity   5/5     Lower extremity   5/5 Tone and bulk:normal tone throughout; no atrophy noted Sensory: Pinprick and light touch intact throughout, bilaterally Deep Tendon Reflexes: 2+ and symmetric throughout Plantars: Right: downgoing   Left: downgoing Cerebellar: normal finger-to-nose, normal rapid alternating movements and normal heel-to-shin test Gait: normal gait and station    Medications:  Scheduled: . aspirin EC  81 mg Oral Daily  . atorvastatin  80 mg Oral q1800  . carvedilol  3.125 mg Oral BID WC  . diazepam  5 mg Oral BID  . hydrALAZINE  25 mg Oral  Q8H  . meclizine  50 mg Oral TID  . sodium chloride flush  3 mL Intravenous Q12H  . venlafaxine XR  75 mg Oral QHS   Continuous: . 0.9 % NaCl with KCl 20 mEq / L 75 mL/hr at 12/24/17 0014   DQQ:[id number], hydrALAZINE, HYDROcodone-acetaminophen, LORazepam, ondansetron, sodium phosphate  Pertinent Labs/Diagnostics: none     Etta Quill PA-C [facility] [phone]   Assessment: no significant change 62 year old female with signs/symptoms consistent with a peripheral vertigo.  With her history of tinnitus, I do wonder if this represents Mnire's, but this is not clear and I think that this could represent vestibular neuritis   I discussed steroids with the patient, but she states that she has had bad reactions to steroids in the past and would favor not pursuing them.  I think that this could speed recovery, but would be unlikely to change overall outcome so it is reasonable to hold off.  I am not certain that the finding on the MRI actually represents acute stroke, and if it does it is purely incidental.  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     Recommendations: (No change) - Continue symptomatic treatment - goal LDL to less than 70 - Glucose control - Fall precautions -Antiplatelet therapy with aspirin -vestibular rehab    12/24/2017, 12:15 PM

## 2017-12-24 NOTE — Progress Notes (Addendum)
Physical Therapy Treatment Patient Details Name: Meghan Mcdonald MRN: [id number] DOB: 06-15-1955 Today's Date: 12/24/2017    History of Present Illness 62 y.o female admitted with 3-4 day episode of dizziness and room spinning vertigo, as well nausea vomiting, gradual hearing loss and tinnitus. Went to [place] [facility] where head CT was negative, transferred here for further workup. PMH includes: HTN, HLD, Depression, Asthma, vertigo 1 month ago.     PT Comments    Patient doing well with therapy today, progressed to OOB mobility and ambulating with unsteadiness but safely in hall with contact guard, husband present comfortable with safely guarding patient. Focused on vestibular exercises and discussed expectations, progression, and longer term goals of ongoing therapy. Patient and husband express gratitude to staff for helping them and are looking forward to next steps after medical d/c today.      Follow Up Recommendations  Home health PT;Supervision for mobility/OOB(HHPT FOR VESTIBULAR, PROGRESSION TO OP NEURO PT FOR VESTIB )     Equipment Recommendations  None recommended by PT    Recommendations for Other Services       Precautions / Restrictions Precautions Precautions: Fall    Mobility  Bed Mobility Overal bed mobility: Modified Independent                Transfers Overall transfer level: Modified independent                  Ambulation/Gait Ambulation/Gait assistance: Min guard Gait Distance (Feet): 80 Feet Assistive device: 1 person hand held assist Gait Pattern/deviations: Step-to pattern Gait velocity: decreased   General Gait Details: pt with imbalance walking unsteady, light min guard hand held assist for stability.   Stairs             Wheelchair Mobility    Modified Rankin (Stroke Patients Only)       Balance Overall balance assessment: Needs assistance   Sitting balance-Leahy Scale: Good       Standing balance-Leahy Scale:  Poor                              Cognition Arousal/Alertness: Awake/alert Behavior During Therapy: WFL for tasks assessed/performed Overall Cognitive Status: Within Functional Limits for tasks assessed                                        Exercises Other Exercises Other Exercises: Habitiation exercises with head turning 1 minute x3 VORx1 VORx2 Other Exercises: Lengthy discussion with patient over progression of vestibular exercises    General Comments General comments (skin integrity, edema, etc.): discussed safety considerations and progression of vestibular rehab settings and goals.       Pertinent Vitals/Pain      Home Living                      Prior Function            PT Goals (current goals can now be found in the care plan section) Acute Rehab PT Goals PT Goal Formulation: With patient Time For Goal Achievement: 12/30/17 Potential to Achieve Goals: Good Progress towards PT goals: Progressing toward goals    Frequency    Min 3X/week      PT Plan Current plan remains appropriate    Co-evaluation  AM-PAC PT "6 Clicks" Daily Activity  Outcome Measure  Difficulty turning over in bed (including adjusting bedclothes, sheets and blankets)?: A Little Difficulty moving from lying on back to sitting on the side of the bed? : A Little Difficulty sitting down on and standing up from a chair with arms (e.g., wheelchair, bedside commode, etc,.)?: A Little Help needed moving to and from a bed to chair (including a wheelchair)?: A Little Help needed walking in hospital room?: A Little Help needed climbing 3-5 steps with a railing? : A Little 6 Click Score: 18    End of Session Equipment Utilized During Treatment: Gait belt Activity Tolerance: Patient tolerated treatment well Patient left: in bed Nurse Communication: Mobility status PT Visit Diagnosis: Unsteadiness on feet (R26.81);Dizziness and giddiness  (R42);Other symptoms and signs involving the nervous system (R29.898);Difficulty in walking, not elsewhere classified (R26.2)     Time: 1100-1145 PT Time Calculation (min) (ACUTE ONLY): 45 min  Charges:  $Gait Training: 8-22 mins $Therapeutic Exercise: 8-22 mins $Self Care/Home Management: 8-22                     Reinaldo Berber, PT, DPT Acute Rehabilitation Services Pager: [phone] Office: [place] 12/24/2017, 2:45 PM

## 2017-12-25 DIAGNOSIS — I10 Essential (primary) hypertension: Secondary | ICD-10-CM | POA: Diagnosis not present

## 2017-12-25 DIAGNOSIS — R42 Dizziness and giddiness: Secondary | ICD-10-CM | POA: Diagnosis not present

## 2017-12-25 DIAGNOSIS — J45909 Unspecified asthma, uncomplicated: Secondary | ICD-10-CM | POA: Diagnosis not present

## 2017-12-25 DIAGNOSIS — F329 Major depressive disorder, single episode, unspecified: Secondary | ICD-10-CM | POA: Diagnosis not present

## 2017-12-25 DIAGNOSIS — E785 Hyperlipidemia, unspecified: Secondary | ICD-10-CM | POA: Diagnosis not present

## 2017-12-28 DIAGNOSIS — E785 Hyperlipidemia, unspecified: Secondary | ICD-10-CM | POA: Diagnosis not present

## 2017-12-28 DIAGNOSIS — J45909 Unspecified asthma, uncomplicated: Secondary | ICD-10-CM | POA: Diagnosis not present

## 2017-12-28 DIAGNOSIS — I10 Essential (primary) hypertension: Secondary | ICD-10-CM | POA: Diagnosis not present

## 2017-12-28 DIAGNOSIS — F329 Major depressive disorder, single episode, unspecified: Secondary | ICD-10-CM | POA: Diagnosis not present

## 2017-12-28 DIAGNOSIS — R42 Dizziness and giddiness: Secondary | ICD-10-CM | POA: Diagnosis not present

## 2017-12-30 DIAGNOSIS — H8102 Meniere's disease, left ear: Secondary | ICD-10-CM | POA: Diagnosis not present

## 2017-12-30 DIAGNOSIS — I679 Cerebrovascular disease, unspecified: Secondary | ICD-10-CM | POA: Diagnosis not present

## 2017-12-30 DIAGNOSIS — I10 Essential (primary) hypertension: Secondary | ICD-10-CM | POA: Diagnosis not present

## 2017-12-30 DIAGNOSIS — R42 Dizziness and giddiness: Secondary | ICD-10-CM | POA: Diagnosis not present

## 2017-12-30 DIAGNOSIS — F329 Major depressive disorder, single episode, unspecified: Secondary | ICD-10-CM | POA: Diagnosis not present

## 2017-12-30 DIAGNOSIS — E782 Mixed hyperlipidemia: Secondary | ICD-10-CM | POA: Diagnosis not present

## 2017-12-30 DIAGNOSIS — E785 Hyperlipidemia, unspecified: Secondary | ICD-10-CM | POA: Diagnosis not present

## 2017-12-30 DIAGNOSIS — J45909 Unspecified asthma, uncomplicated: Secondary | ICD-10-CM | POA: Diagnosis not present

## 2018-01-04 DIAGNOSIS — F329 Major depressive disorder, single episode, unspecified: Secondary | ICD-10-CM | POA: Diagnosis not present

## 2018-01-04 DIAGNOSIS — R42 Dizziness and giddiness: Secondary | ICD-10-CM | POA: Diagnosis not present

## 2018-01-04 DIAGNOSIS — I10 Essential (primary) hypertension: Secondary | ICD-10-CM | POA: Diagnosis not present

## 2018-01-04 DIAGNOSIS — E785 Hyperlipidemia, unspecified: Secondary | ICD-10-CM | POA: Diagnosis not present

## 2018-01-04 DIAGNOSIS — J45909 Unspecified asthma, uncomplicated: Secondary | ICD-10-CM | POA: Diagnosis not present

## 2018-01-07 DIAGNOSIS — R42 Dizziness and giddiness: Secondary | ICD-10-CM | POA: Diagnosis not present

## 2018-01-07 DIAGNOSIS — J45909 Unspecified asthma, uncomplicated: Secondary | ICD-10-CM | POA: Diagnosis not present

## 2018-01-07 DIAGNOSIS — I10 Essential (primary) hypertension: Secondary | ICD-10-CM | POA: Diagnosis not present

## 2018-01-07 DIAGNOSIS — E785 Hyperlipidemia, unspecified: Secondary | ICD-10-CM | POA: Diagnosis not present

## 2018-01-07 DIAGNOSIS — F329 Major depressive disorder, single episode, unspecified: Secondary | ICD-10-CM | POA: Diagnosis not present

## 2018-01-11 DIAGNOSIS — I10 Essential (primary) hypertension: Secondary | ICD-10-CM | POA: Diagnosis not present

## 2018-01-11 DIAGNOSIS — E785 Hyperlipidemia, unspecified: Secondary | ICD-10-CM | POA: Diagnosis not present

## 2018-01-11 DIAGNOSIS — R42 Dizziness and giddiness: Secondary | ICD-10-CM | POA: Diagnosis not present

## 2018-01-11 DIAGNOSIS — J45909 Unspecified asthma, uncomplicated: Secondary | ICD-10-CM | POA: Diagnosis not present

## 2018-01-11 DIAGNOSIS — F329 Major depressive disorder, single episode, unspecified: Secondary | ICD-10-CM | POA: Diagnosis not present

## 2018-01-15 DIAGNOSIS — J343 Hypertrophy of nasal turbinates: Secondary | ICD-10-CM | POA: Diagnosis not present

## 2018-01-15 DIAGNOSIS — E785 Hyperlipidemia, unspecified: Secondary | ICD-10-CM | POA: Diagnosis not present

## 2018-01-15 DIAGNOSIS — H903 Sensorineural hearing loss, bilateral: Secondary | ICD-10-CM | POA: Diagnosis not present

## 2018-01-15 DIAGNOSIS — F329 Major depressive disorder, single episode, unspecified: Secondary | ICD-10-CM | POA: Diagnosis not present

## 2018-01-15 DIAGNOSIS — R42 Dizziness and giddiness: Secondary | ICD-10-CM | POA: Diagnosis not present

## 2018-01-15 DIAGNOSIS — J45909 Unspecified asthma, uncomplicated: Secondary | ICD-10-CM | POA: Diagnosis not present

## 2018-01-15 DIAGNOSIS — H9313 Tinnitus, bilateral: Secondary | ICD-10-CM | POA: Diagnosis not present

## 2018-01-15 DIAGNOSIS — I10 Essential (primary) hypertension: Secondary | ICD-10-CM | POA: Diagnosis not present

## 2018-01-25 DIAGNOSIS — H26492 Other secondary cataract, left eye: Secondary | ICD-10-CM | POA: Diagnosis not present

## 2018-01-26 ENCOUNTER — Encounter: Payer: Self-pay | Admitting: Neurology

## 2018-01-26 ENCOUNTER — Ambulatory Visit (INDEPENDENT_AMBULATORY_CARE_PROVIDER_SITE_OTHER): Payer: BLUE CROSS/BLUE SHIELD | Admitting: Neurology

## 2018-01-26 VITALS — BP 128/82 | HR 64 | Ht 62.0 in | Wt 173.2 lb

## 2018-01-26 DIAGNOSIS — R42 Dizziness and giddiness: Secondary | ICD-10-CM | POA: Diagnosis not present

## 2018-01-26 DIAGNOSIS — G43009 Migraine without aura, not intractable, without status migrainosus: Secondary | ICD-10-CM | POA: Diagnosis not present

## 2018-01-26 MED ORDER — TOPIRAMATE ER 50 MG PO CAP24: 1.0000 | ORAL_CAPSULE | Freq: Every day | ORAL | 2 refills | Status: DC

## 2018-01-26 NOTE — Patient Instructions (Signed)
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  I doubt this represents a brainstem stroke as MRI does not show a stroke but is despite she being symptomatic for several days.  MRI shows a questionable right occipital weekly diffusion positive lesion which is of unclear significance.  She has had flareup of her migraine headaches following these episodes.  I recommend a trial of Topamax 50 mg daily for prophylaxis.  Continue outpatient vestibular therapy.  Continue aspirin for stroke prevention and aggressive risk factor modification with systolic blood pressure goal below 130, hemoglobin A1c goal below 6.5 and LDL cholesterol goal below 7 0 mg percent.  She was also encouraged to eat a healthy diet and lose weight and be active.  She was advised to follow-up with ENT physician Dr. Redmond Baseman for her vestibular dysfunction.  She will return for follow-up in 3 months or call earlier if necessary.

## 2018-01-26 NOTE — Progress Notes (Signed)
[facility] [address] [place]. [place] [address] [phone]       OFFICE CONSULT NOTE  [name] Date of Birth:  11/29/1955 Medical Record Number:  [id number]   Referring MD:  Vance Gather Reason for Referral: vertigo HPI: Ms Meghan Mcdonald is a 23 year pleasant lady seen today for initial office consultation visit for recurrent episodes of vertigo.  History is obtained from the patient and review of electronic medical records.  I have personally reviewed imaging films.Meghan Mcdonald is a 62 year old female with past medical history significant for essential hypertension, dyslipidemia, asthma, depression, kidney stones,hearing loss, tinnitus and intermittent attacks of vertigo.  She woke up on 12/19/2017 with sudden onset of severe vertigo resulting in significant nausea and throwing up.  She could not eat anything the rest pain could barely get up and walk.  Only thing which helped but was keeping her eyes closed.  Her symptoms continued the next day and abated.  She finally called her primary care physician who called in some Valium and Zofran as well as meclizine which she had from prior episode.  Since symptoms did not get better she presented to the emergency room on 12/23/2017 at [facility].  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  Patient had long-standing history of tinnitus in both ears as well as some hearing loss in the right ear.  She in fact had a similar episode a month prior which was milder and lasted 3 days.  She was started on meclizine which helped.  She was seen by ENT physician Dr. Redmond Baseman and recent follow-up audiogram suggested improvement in her right-sided hearing loss which occurred following the initial episode.  During the second episode  she had CT angiogram of the brain and neck done both of which were unremarkable.  She was seen on consultation by Dr. Kathrynn Speed who felt the MRI diffusion abnormality was likely is incidental finding or an artifact unrelated to her symptoms.  The patient had a remote history of migraine headaches which she had outgrown but she feels after these 2 recent episodes of vertigo her headaches seem to have come back.  She describes about 2-3 headaches a week now which are left frontal and occasionally occipital they are moderate in intensity at times they can be difficult to bear.  She takes some Tylenol occasionally which helps.  She denies significant light sensitivity and sound sensitivity with the headaches.  There are no accompanying visual or other focal neurological symptoms.  The patient was started on aspirin and she is tolerating it well without any side effects.  She states her blood pressure is well controlled today it is 125/82.  She had lipid profile checked in the hospital which was elevated and her Lipitor dose was increased to 80 mg daily she is tolerating well so far.  She does not have an appointment with the primary care physician in a few weeks to check follow-up lipid profile.  She has presently finished home physical therapy and has been taught vestibular stabilization exercises which seem to be helping.  ROS:   14 system review of systems is positive for weight loss, hearing loss, ringing in the ears, spinning sensation, double vision, headache, dizziness, anxiety, not enough sleep, insomnia and all other systems negative PMH:  Past  Medical History:  Diagnosis Date  . Asthma   . Chronic shoulder pain   . Depression   . GERD (gastroesophageal reflux disease)   . Hyperlipemia   . Hypertension   . Insomnia   . Kidney stone   . Migraine   . Overweight   . Rapid palpitations   . Stroke [facility])     Social History:  Social History   Socioeconomic History  . Marital status:  Married    Spouse name: Not on file  . Number of children: Not on file  . Years of education: Not on file  . Highest education level: Not on file  Occupational History  . Not on file  Social Needs  . Financial resource strain: Not on file  . Food insecurity:    Worry: Not on file    Inability: Not on file  . Transportation needs:    Medical: Not on file    Non-medical: Not on file  Tobacco Use  . Smoking status: Never Smoker  . Smokeless tobacco: Never Used  Substance and Sexual Activity  . Alcohol use: Yes    Comment: weekly  . Drug use: Never  . Sexual activity: Not on file  Lifestyle  . Physical activity:    Days per week: Not on file    Minutes per session: Not on file  . Stress: Not on file  Relationships  . Social connections:    Talks on phone: Not on file    Gets together: Not on file    Attends religious service: Not on file    Active member of club or organization: Not on file    Attends meetings of clubs or organizations: Not on file    Relationship status: Not on file  . Intimate partner violence:    Fear of current or ex partner: Not on file    Emotionally abused: Not on file    Physically abused: Not on file    Forced sexual activity: Not on file  Other Topics Concern  . Not on file  Social History Narrative  . Not on file    Medications:   Current Outpatient Medications on File Prior to Visit  Medication Sig Dispense Refill  . aspirin EC 81 MG EC tablet Take 1 tablet (81 mg total) by mouth daily. 30 tablet 0  . atorvastatin (LIPITOR) 80 MG tablet Take 1 tablet (80 mg total) by mouth daily at 6 PM. 30 tablet 0  . diazepam (VALIUM) 5 MG tablet Take 1 tablet (5 mg total) by mouth every 8 (eight) hours as needed (nausea and dizziness). 15 tablet 0  . famotidine (PEPCID) 20 MG tablet Take 20 mg by mouth daily.    . hydrochlorothiazide (HYDRODIURIL) 25 MG tablet Take 25 mg by mouth daily.    . meclizine (ANTIVERT) 25 MG tablet Take 2 tablets (50 mg  total) by mouth 3 (three) times daily. 30 tablet 0  . meloxicam (MOBIC) 15 MG tablet     . metoprolol tartrate (LOPRESSOR) 50 MG tablet Take 50 mg by mouth 2 (two) times daily.    . ondansetron (ZOFRAN) 8 MG tablet Take 1 tablet (8 mg total) by mouth every 8 (eight) hours as needed for refractory nausea / vomiting. 15 tablet 0  . ramipril (ALTACE) 10 MG capsule Take 10 mg by mouth 2 (two) times daily.    Marland Kitchen venlafaxine XR (EFFEXOR-XR) 75 MG 24 hr capsule Take 75 mg by mouth at bedtime.     Marland Kitchen  venlafaxine XR (EFFEXOR-XR) 75 MG 24 hr capsule Take by mouth.    . vitamin B-12 (CYANOCOBALAMIN) 100 MCG tablet Take by mouth.     No current facility-administered medications on file prior to visit.     Allergies:   Allergies  Allergen Reactions  . Amitriptyline Other (See Comments)    oversedation  . Aspirin Other (See Comments)    Severe Blood Thinning  . Micardis [Telmisartan] Other (See Comments)    unknown  . Hibiclens [Chlorhexidine Gluconate] Rash    Skin blisters  . Loestrin [Norethindrone Acet-Ethinyl Est] Rash  . Prednisone Rash    Physical Exam General: well developed, well nourished middle-aged lady, seated, in no evident distress Head: head normocephalic and atraumatic.   Neck: supple with no carotid or supraclavicular bruits Cardiovascular: regular rate and rhythm, no murmurs Musculoskeletal: no deformity Skin:  no rash/petichiae Vascular:  Normal pulses all extremities  Neurologic Exam Mental Status: Awake and fully alert. Oriented to place and time. Recent and remote memory intact. Attention span, concentration and fund of knowledge appropriate. Mood and affect appropriate.  Cranial Nerves: Fundoscopic exam reveals sharp disc margins. Pupils equal, briskly reactive to light. Extraocular movements full without nystagmus. Visual fields full to confrontation. Hearing mildly diminished bilaterally. Facial sensation intact. Face, tongue, palate moves normally and symmetrically.    Motor: Normal bulk and tone. Normal strength in all tested extremity muscles. Sensory.: intact to touch , pinprick , position and vibratory sensation.  Coordination: Rapid alternating movements normal in all extremities. Finger-to-nose and heel-to-shin performed accurately bilaterally.  Hallpike maneuver not done.  Positive Fukuda stepping test with patient clearly taking several steps of the base and rotating to the right.  Head shaking test negative Gait and Station: Arises from chair without difficulty. Stance is normal. Gait demonstrates normal stride length and balance . Able to heel, toe and tandem walk with moderate difficulty.  Reflexes: 1+ and symmetric. Toes downgoing.       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  Brainstem stroke or structural lesion is unlikely given negative imaging studies.  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  I doubt this represents a brainstem stroke as MRI does not show a stroke but is despite she being symptomatic for several days.  MRI shows a questionable right occipital weekly diffusion positive lesion which is of unclear significance.  She has had flareup of her migraine headaches following these episodes.  I recommend a trial of Topamax 50 mg daily for prophylaxis.  Continue outpatient vestibular therapy.  Continue aspirin for stroke prevention and aggressive risk factor modification with systolic blood pressure goal below 130, hemoglobin A1c goal below 6.5 and LDL cholesterol goal below 7 0 mg percent.  She was also encouraged  to eat a healthy diet and lose weight and be active.  She was advised to follow-up with ENT physician Dr. Redmond Baseman for her vestibular dysfunction.  Greater than 50% time during this 45-minute consultation visit was spent on counseling and coordination of care about her episodes of vertigo and discussion about brainstem stroke and vestibular dysfunction and answering questions.  She will  return for follow-up in 3 months or call earlier if necessary. Antony Contras, MD  [facility] [address] [place], [place] [address]  Phone [phone] Fax [phone] Note: This document was prepared with digital dictation and possible smart phrase technology. Any transcriptional errors that result from this process are unintentional.

## 2018-01-28 ENCOUNTER — Telehealth: Payer: Self-pay

## 2018-01-28 NOTE — Telephone Encounter (Signed)
PA done on cover my meds for Trokendi XR. PA pending.

## 2018-01-28 NOTE — Telephone Encounter (Signed)
Approvedtoday  Trokendi XR CaseId:[id number];Status:Approved;Review Type:Prior Auth;Coverage Start Date:12/29/2017;Coverage End Date:01/28/2019;

## 2018-01-29 DIAGNOSIS — H8113 Benign paroxysmal vertigo, bilateral: Secondary | ICD-10-CM | POA: Diagnosis not present

## 2018-02-02 DIAGNOSIS — L814 Other melanin hyperpigmentation: Secondary | ICD-10-CM | POA: Diagnosis not present

## 2018-02-02 DIAGNOSIS — L821 Other seborrheic keratosis: Secondary | ICD-10-CM | POA: Diagnosis not present

## 2018-02-02 DIAGNOSIS — D485 Neoplasm of uncertain behavior of skin: Secondary | ICD-10-CM | POA: Diagnosis not present

## 2018-02-02 DIAGNOSIS — L570 Actinic keratosis: Secondary | ICD-10-CM | POA: Diagnosis not present

## 2018-02-02 DIAGNOSIS — D2272 Melanocytic nevi of left lower limb, including hip: Secondary | ICD-10-CM | POA: Diagnosis not present

## 2018-02-02 DIAGNOSIS — D225 Melanocytic nevi of trunk: Secondary | ICD-10-CM | POA: Diagnosis not present

## 2018-02-02 DIAGNOSIS — B078 Other viral warts: Secondary | ICD-10-CM | POA: Diagnosis not present

## 2018-02-04 DIAGNOSIS — H8113 Benign paroxysmal vertigo, bilateral: Secondary | ICD-10-CM | POA: Diagnosis not present

## 2018-02-09 DIAGNOSIS — R42 Dizziness and giddiness: Secondary | ICD-10-CM | POA: Diagnosis not present

## 2018-02-09 DIAGNOSIS — H903 Sensorineural hearing loss, bilateral: Secondary | ICD-10-CM | POA: Diagnosis not present

## 2018-02-09 DIAGNOSIS — H9041 Sensorineural hearing loss, unilateral, right ear, with unrestricted hearing on the contralateral side: Secondary | ICD-10-CM | POA: Diagnosis not present

## 2018-02-10 DIAGNOSIS — H8113 Benign paroxysmal vertigo, bilateral: Secondary | ICD-10-CM | POA: Diagnosis not present

## 2018-02-24 DIAGNOSIS — G5602 Carpal tunnel syndrome, left upper limb: Secondary | ICD-10-CM | POA: Diagnosis not present

## 2018-02-25 DIAGNOSIS — H8113 Benign paroxysmal vertigo, bilateral: Secondary | ICD-10-CM | POA: Diagnosis not present

## 2018-02-26 DIAGNOSIS — H26492 Other secondary cataract, left eye: Secondary | ICD-10-CM | POA: Diagnosis not present

## 2018-02-26 DIAGNOSIS — I10 Essential (primary) hypertension: Secondary | ICD-10-CM | POA: Diagnosis not present

## 2018-02-26 DIAGNOSIS — Z961 Presence of intraocular lens: Secondary | ICD-10-CM | POA: Diagnosis not present

## 2018-02-26 DIAGNOSIS — H02831 Dermatochalasis of right upper eyelid: Secondary | ICD-10-CM | POA: Diagnosis not present

## 2018-03-04 DIAGNOSIS — H8113 Benign paroxysmal vertigo, bilateral: Secondary | ICD-10-CM | POA: Diagnosis not present

## 2018-03-08 DIAGNOSIS — Z9842 Cataract extraction status, left eye: Secondary | ICD-10-CM | POA: Diagnosis not present

## 2018-05-13 DIAGNOSIS — M152 Bouchard's nodes (with arthropathy): Secondary | ICD-10-CM | POA: Diagnosis not present

## 2018-05-13 DIAGNOSIS — R739 Hyperglycemia, unspecified: Secondary | ICD-10-CM | POA: Diagnosis not present

## 2018-05-13 DIAGNOSIS — N183 Chronic kidney disease, stage 3 (moderate): Secondary | ICD-10-CM | POA: Diagnosis not present

## 2018-05-13 DIAGNOSIS — E782 Mixed hyperlipidemia: Secondary | ICD-10-CM | POA: Diagnosis not present

## 2018-05-13 DIAGNOSIS — I10 Essential (primary) hypertension: Secondary | ICD-10-CM | POA: Diagnosis not present

## 2018-05-17 ENCOUNTER — Ambulatory Visit: Payer: BLUE CROSS/BLUE SHIELD | Admitting: Neurology

## 2018-07-10 ENCOUNTER — Other Ambulatory Visit: Payer: Self-pay

## 2018-07-10 ENCOUNTER — Encounter (HOSPITAL_COMMUNITY): Payer: Self-pay

## 2018-07-10 ENCOUNTER — Emergency Department (HOSPITAL_COMMUNITY)
Admission: EM | Admit: 2018-07-10 | Discharge: 2018-07-10 | Disposition: A | Discharge status: Home / Self Care | Payer: BLUE CROSS/BLUE SHIELD | Attending: Emergency Medicine | Admitting: Emergency Medicine

## 2018-07-10 DIAGNOSIS — W010XXA Fall on same level from slipping, tripping and stumbling without subsequent striking against object, initial encounter: Secondary | ICD-10-CM | POA: Diagnosis not present

## 2018-07-10 DIAGNOSIS — Z7982 Long term (current) use of aspirin: Secondary | ICD-10-CM | POA: Diagnosis not present

## 2018-07-10 DIAGNOSIS — S99911A Unspecified injury of right ankle, initial encounter: Secondary | ICD-10-CM | POA: Diagnosis not present

## 2018-07-10 DIAGNOSIS — R52 Pain, unspecified: Secondary | ICD-10-CM | POA: Diagnosis not present

## 2018-07-10 DIAGNOSIS — S82201A Unspecified fracture of shaft of right tibia, initial encounter for closed fracture: Secondary | ICD-10-CM | POA: Diagnosis not present

## 2018-07-10 DIAGNOSIS — Z79899 Other long term (current) drug therapy: Secondary | ICD-10-CM | POA: Diagnosis not present

## 2018-07-10 DIAGNOSIS — S82891A Other fracture of right lower leg, initial encounter for closed fracture: Secondary | ICD-10-CM

## 2018-07-10 DIAGNOSIS — S82401A Unspecified fracture of shaft of right fibula, initial encounter for closed fracture: Secondary | ICD-10-CM | POA: Diagnosis not present

## 2018-07-10 DIAGNOSIS — Y929 Unspecified place or not applicable: Secondary | ICD-10-CM | POA: Diagnosis not present

## 2018-07-10 DIAGNOSIS — S8251XA Displaced fracture of medial malleolus of right tibia, initial encounter for closed fracture: Secondary | ICD-10-CM | POA: Insufficient documentation

## 2018-07-10 DIAGNOSIS — S82831B Other fracture of upper and lower end of right fibula, initial encounter for open fracture type I or II: Secondary | ICD-10-CM | POA: Diagnosis not present

## 2018-07-10 DIAGNOSIS — I10 Essential (primary) hypertension: Secondary | ICD-10-CM | POA: Insufficient documentation

## 2018-07-10 DIAGNOSIS — Y9389 Activity, other specified: Secondary | ICD-10-CM | POA: Insufficient documentation

## 2018-07-10 DIAGNOSIS — J45909 Unspecified asthma, uncomplicated: Secondary | ICD-10-CM | POA: Diagnosis not present

## 2018-07-10 DIAGNOSIS — Y999 Unspecified external cause status: Secondary | ICD-10-CM | POA: Insufficient documentation

## 2018-07-10 DIAGNOSIS — S82831A Other fracture of upper and lower end of right fibula, initial encounter for closed fracture: Secondary | ICD-10-CM | POA: Insufficient documentation

## 2018-07-10 MED ORDER — HYDROCODONE-ACETAMINOPHEN 5-325 MG PO TABS: 1.0000 | ORAL_TABLET | Freq: Four times a day (QID) | ORAL | 0 refills | Status: DC | PRN

## 2018-07-10 NOTE — ED Notes (Addendum)
Xray CD brought to XR- staff was not present. Gave CD to [facility] RN

## 2018-07-10 NOTE — ED Provider Notes (Signed)
[place] DEPT Provider Note   CSN: [id number] Arrival date & time: 07/10/18  1308    History   Chief Complaint Chief Complaint  Patient presents with  . Ankle Injury    HPI    Meghan Mcdonald is a 63 y.o. female with a PMHx of asthma, GERD, HTN, HLD, and other conditions listed below, who presents to the ED with complaints of right ankle injury sustained earlier today.  Patient states that she slipped on a wet rock causing her left leg to go out in front of her and landing on her right foot underneath her.  Patient was seen at an urgent care prior to arrival, x-rays were obtained, she was sent here with a picture of 1 of the x-ray images, and told that she needed to come here for possible "reduction".  After calling [facility] radiology, the x-ray impression report is as follows: "Displaced medial malleolar fracture and distal fibular fracture with possible subtle posterior malleolar fracture". There are no other findings aside from that, no report of dislocation. Pt isn't sure why she was sent here, she was just told that it may need "reduction" so they needed to come here.   She complains of 4/10 constant throbbing and sharp right ankle pain that radiates up to the distal shin area, worse with holding it down with gravity, and unrelieved with Tylenol given at urgent care.  She reports associated swelling and bruising.  She denies any numbness, tingling, focal weakness, or any other complaints or injuries at this time.  She has seen Dr. Rip Harbour at [place] in the past.  The history is provided by the patient and medical records. No language interpreter was used.  Ankle Injury     Past Medical History:  Diagnosis Date  . Asthma   . Chronic shoulder pain   . Depression   . GERD (gastroesophageal reflux disease)   . Hyperlipemia   . Hypertension   . Insomnia   . Kidney stone   . Migraine   . Overweight   . Rapid palpitations   . Stroke  [facility])     Patient Active Problem List   Diagnosis Date Noted  . Acute CVA (cerebrovascular accident) ([place]) 12/23/2017  . Severe vertigo 12/22/2017  . Vertigo 12/22/2017  . Dyslipidemia 12/22/2017  . Essential hypertension 12/22/2017  . Nausea & vomiting 12/22/2017    Past Surgical History:  Procedure Laterality Date  . BLADDER SURGERY    . CARPAL TUNNEL RELEASE    . CHOLECYSTECTOMY    . TONSILLECTOMY       OB History   No obstetric history on file.      Home Medications    Prior to Admission medications   Medication Sig Start Date End Date Taking? Authorizing Provider  aspirin EC 81 MG EC tablet Take 1 tablet (81 mg total) by mouth daily. 12/25/17   Patrecia Pour, MD  atorvastatin (LIPITOR) 80 MG tablet Take 1 tablet (80 mg total) by mouth daily at 6 PM. 12/24/17   Patrecia Pour, MD  diazepam (VALIUM) 5 MG tablet Take 1 tablet (5 mg total) by mouth every 8 (eight) hours as needed (nausea and dizziness). 12/24/17   Patrecia Pour, MD  famotidine (PEPCID) 20 MG tablet Take 20 mg by mouth daily.    [provider]  hydrochlorothiazide (HYDRODIURIL) 25 MG tablet Take 25 mg by mouth daily. 11/27/17   [provider]  meclizine (ANTIVERT) 25 MG tablet Take 2  tablets (50 mg total) by mouth 3 (three) times daily. 12/24/17   Patrecia Pour, MD  meloxicam (MOBIC) 15 MG tablet  01/20/18   [provider]  metoprolol tartrate (LOPRESSOR) 50 MG tablet Take 50 mg by mouth 2 (two) times daily. 11/27/17   [provider]  ondansetron (ZOFRAN) 8 MG tablet Take 1 tablet (8 mg total) by mouth every 8 (eight) hours as needed for refractory nausea / vomiting. 12/24/17   Patrecia Pour, MD  ramipril (ALTACE) 10 MG capsule Take 10 mg by mouth 2 (two) times daily. 12/10/17   [provider]  Topiramate ER (TROKENDI XR) 50 MG CP24 Take 1 tablet by mouth daily. 01/26/18   Garvin Fila, MD  venlafaxine XR (EFFEXOR-XR) 75 MG 24 hr capsule Take 75 mg by mouth at bedtime.   11/27/17   [provider]  venlafaxine XR (EFFEXOR-XR) 75 MG 24 hr capsule Take by mouth. 11/27/17   [provider]  vitamin B-12 (CYANOCOBALAMIN) 100 MCG tablet Take by mouth.    [provider]    Family History Family History  Problem Relation Age of Onset  . Hypertension Mother   . Hypertension Father     Social History Social History   Tobacco Use  . Smoking status: Never Smoker  . Smokeless tobacco: Never Used  Substance Use Topics  . Alcohol use: Yes    Comment: weekly  . Drug use: Never     Allergies   Amitriptyline; Aspirin; Micardis [telmisartan]; Hibiclens [chlorhexidine gluconate]; Loestrin [norethindrone acet-ethinyl est]; and Prednisone   Review of Systems Review of Systems  Musculoskeletal: Positive for arthralgias and joint swelling.  Skin: Positive for color change.  Allergic/Immunologic: Negative for immunocompromised state.     Physical Exam Updated Vital Signs BP 119/83 (BP Location: Right Arm)   Pulse 66   Temp 98.2 F (36.8 C) (Oral)   Resp 18   Ht 5\' 1"  (1.549 m)   Wt 79.4 kg   SpO2 95%   BMI 33.07 kg/m   Physical Exam Vitals signs and nursing note reviewed.  Constitutional:      General: She is not in acute distress.    Appearance: Normal appearance. She is well-developed. She is not toxic-appearing.     Comments: Afebrile, nontoxic, NAD  HENT:     Head: Normocephalic and atraumatic.  Eyes:     General:        Right eye: No discharge.        Left eye: No discharge.     Conjunctiva/sclera: Conjunctivae normal.  Neck:     Musculoskeletal: Normal range of motion and neck supple.  Cardiovascular:     Rate and Rhythm: Normal rate.     Pulses: Normal pulses.  Pulmonary:     Effort: Pulmonary effort is normal. No respiratory distress.  Abdominal:     General: There is no distension.  Musculoskeletal:     Right ankle: She exhibits decreased range of motion (due to pain), swelling and ecchymosis. She  exhibits no deformity, no laceration and normal pulse. Tenderness. Lateral malleolus and medial malleolus tenderness found. Achilles tendon normal.     Comments: R ankle with limited ROM due to pain, +swelling, no crepitus or deformity, with moderate TTP of the lateral and medial malleoli, but no TTP or swelling of fore foot or calf. No break in skin. +bruising, no erythema/warmth. Achilles intact. Good pedal pulse and cap refill of all toes. Wiggling toes without difficulty. Sensation grossly intact.  Soft compartments   Skin:    General: Skin is warm and dry.     Findings: No rash.  Neurological:     Mental Status: She is alert and oriented to person, place, and time.     Sensory: Sensation is intact. No sensory deficit.     Motor: Motor function is intact.  Psychiatric:        Mood and Affect: Mood and affect normal.        Behavior: Behavior normal.      ED Treatments / Results  Labs (all labs ordered are listed, but only abnormal results are displayed) Labs Reviewed - No data to display  EKG None  Radiology No results found.   R ankle xray impression per [facility] Radiology: displaced medial malleolar fracture and distal fibular fracture, possible subtle posterior malleolar fracture.    Procedures Procedures (including critical care time)  SPLINT APPLICATION Date/Time: 7:35 PM Authorized by: Reece Agar Consent: Verbal consent obtained. Risks and benefits: risks, benefits and alternatives were discussed Consent given by: patient Splint applied by: orthopedic technician Location details: R ankle Splint type: posterior short leg and sugar tong ([place]) Supplies used: orthoglass Post-procedure: The splinted body part was neurovascularly unchanged following the procedure. Patient tolerance: Patient tolerated the procedure well with no immediate complications.     Medications Ordered in ED Medications - No data to display   Initial Impression / Assessment  and Plan / ED Course  I have reviewed the triage vital signs and the nursing notes.  Pertinent labs & imaging results that were available during my care of the patient were reviewed by me and considered in my medical decision making (see chart for details).        63 y.o. female here after urgent care center for her right ankle fracture.  Patient slipped and fell onto her right ankle, had x-rays done there, impression per radiology was that she has a displaced medial malleolar fracture and distal fibular fracture with a possible subtle posterior malleolar fracture.  She brings in 1 of the images, fibular and tibial fractures appear fairly well aligned.  She was told to come here for possible reduction.  No dislocation on x-ray.  On exam, neurovascularly intact, moderate swelling and bruising around the ankle with diffuse tenderness, no tenderness to the calf or forefoot.  Wiggles all toes without difficulty.  Doubt need for reduction at this time, this is an unstable fracture given that it is a possible trimalleolar fracture, so we will place in splint and make nonweightbearing with crutches.  She might need surgical intervention but should not need it today, doubt need for urgent orthopedic consultation.  We will have her follow-up with her orthopedist this week.  Will send home with pain medicines.  Doubt need for further emergent intervention at this time. I explained the diagnosis and have given explicit precautions to return to the ER including for any other new or worsening symptoms. The patient understands and accepts the medical plan as it's been dictated and I have answered their questions. Discharge instructions concerning home care and prescriptions have been given. The patient is STABLE and is discharged to home in good condition.    Final Clinical Impressions(s) / ED Diagnoses   Final diagnoses:  Closed fracture of right ankle, initial encounter  Closed displaced fracture of medial  malleolus of right tibia, initial encounter  Closed fracture of distal end of right fibula, unspecified fracture morphology, initial encounter    ED Discharge Orders  Ordered    HYDROcodone-acetaminophen (NORCO) 5-325 MG tablet  Every 6 hours PRN     07/10/18 [address] [place], [place] 07/10/18 1359    Fredia Sorrow, MD 07/14/18 2052

## 2018-07-10 NOTE — ED Triage Notes (Addendum)
Pt sent from urgent care for tibia/fibula fx evaluation. Pt slipped on wet rocks earlier today.  Unsure of next steps- if reduction is needed.

## 2018-07-10 NOTE — Discharge Instructions (Signed)
Wear ankle splint at all times until you see the orthopedic surgeon. Use crutches for all weight bearing activities. Ice and elevate ankle throughout the day, using ice pack for no more than 20 minutes every hour.  Alternate between ibuprofen and norco as directed as needed for pain relief. Do not drive or operate machinery with pain medication use. Follow up with your orthopedist in 5-7 days for recheck of symptoms and ongoing management of your ankle fracture. Return to the ER for changes or worsening symptoms.

## 2018-07-14 ENCOUNTER — Other Ambulatory Visit: Payer: Self-pay | Admitting: Orthopaedic Surgery

## 2018-07-14 DIAGNOSIS — S82841A Displaced bimalleolar fracture of right lower leg, initial encounter for closed fracture: Secondary | ICD-10-CM | POA: Diagnosis not present

## 2018-07-16 ENCOUNTER — Encounter (HOSPITAL_BASED_OUTPATIENT_CLINIC_OR_DEPARTMENT_OTHER): Payer: Self-pay | Admitting: *Deleted

## 2018-07-16 ENCOUNTER — Other Ambulatory Visit: Payer: Self-pay

## 2018-07-19 ENCOUNTER — Encounter (HOSPITAL_BASED_OUTPATIENT_CLINIC_OR_DEPARTMENT_OTHER)
Admission: RE | Admit: 2018-07-19 | Discharge: 2018-07-19 | Disposition: A | Discharge status: Home / Self Care | Payer: BLUE CROSS/BLUE SHIELD | Source: Ambulatory Visit | Attending: Orthopaedic Surgery | Admitting: Orthopaedic Surgery

## 2018-07-19 ENCOUNTER — Other Ambulatory Visit: Payer: Self-pay

## 2018-07-19 DIAGNOSIS — Z01812 Encounter for preprocedural laboratory examination: Secondary | ICD-10-CM | POA: Insufficient documentation

## 2018-07-19 LAB — BASIC METABOLIC PANEL
Anion gap: 8 (ref 5–15)
BUN: 9 mg/dL (ref 6–20)
CALCIUM: 9.3 mg/dL (ref 8.9–10.3)
CO2: 26 mmol/L (ref 22–32)
CREATININE: 0.79 mg/dL (ref 0.44–1.00)
Chloride: 100 mmol/L (ref 98–111)
GFR calc Af Amer: >60 mL/min (ref >60)
GFR calc non Af Amer: >60 mL/min (ref >60)
GLUCOSE: 196 mg/dL — AB (ref 70–99)
Potassium: 3.7 mmol/L (ref 3.5–5.1)
Sodium: 134 mmol/L — ABNORMAL LOW (ref 135–145)

## 2018-07-20 NOTE — Progress Notes (Signed)
Reviewed lab results with Dr. Lanetta Inch.  Okay to proceed with surgery as scheduled.

## 2018-07-21 NOTE — Anesthesia Preprocedure Evaluation (Addendum)
Anesthesia Evaluation  Patient identified by MRN, date of birth, ID band Patient awake    Reviewed: Allergy & Precautions, NPO status , Patient's Chart, lab work & pertinent test results, reviewed documented beta blocker date and time   Airway Mallampati: II  TM Distance: >3 FB Neck ROM: Full    Dental no notable dental hx. (+) Teeth Intact, Dental Advisory Given   Pulmonary neg pulmonary ROS,    Pulmonary exam normal breath sounds clear to auscultation       Cardiovascular hypertension, Pt. on home beta blockers and Pt. on medications Normal cardiovascular exam Rhythm:Regular Rate:Normal  TTE 06/2017 - Normal LV systolic function without significant valve disease. No clear source of emboli. Atrial septum not well visualized; shunt or PFO cannot be completely excluded.   Neuro/Psych PSYCHIATRIC DISORDERS Anxiety Depression CVA    GI/Hepatic Neg liver ROS, GERD  Medicated and Controlled,  Endo/Other  negative endocrine ROS  Renal/GU negative Renal ROS  negative genitourinary   Musculoskeletal negative musculoskeletal ROS (+)   Abdominal   Peds  Hematology negative hematology ROS (+)   Anesthesia Other Findings Right ankle fracture  Reproductive/Obstetrics                           Anesthesia Physical Anesthesia Plan  ASA: III  Anesthesia Plan: General and Regional   Post-op Pain Management:  Regional for Post-op pain   Induction: Intravenous  PONV Risk Score and Plan: 3 and Ondansetron, Dexamethasone and Midazolam  Airway Management Planned: LMA  Additional Equipment:   Intra-op Plan:   Post-operative Plan: Extubation in OR  Informed Consent: I have reviewed the patients History and Physical, chart, labs and discussed the procedure including the risks, benefits and alternatives for the proposed anesthesia with the patient or authorized representative who has indicated his/her  understanding and acceptance.     Dental advisory given  Plan Discussed with: CRNA  Anesthesia Plan Comments:         Anesthesia Quick Evaluation

## 2018-07-22 ENCOUNTER — Ambulatory Visit (HOSPITAL_BASED_OUTPATIENT_CLINIC_OR_DEPARTMENT_OTHER)
Admission: RE | Admit: 2018-07-22 | Discharge: 2018-07-22 | Disposition: A | Discharge status: Home / Self Care | Payer: BLUE CROSS/BLUE SHIELD | Attending: Orthopaedic Surgery | Admitting: Orthopaedic Surgery

## 2018-07-22 ENCOUNTER — Encounter (HOSPITAL_BASED_OUTPATIENT_CLINIC_OR_DEPARTMENT_OTHER)
Admission: RE | Disposition: A | Discharge status: Home / Self Care | Payer: Self-pay | Source: Home / Self Care | Attending: Orthopaedic Surgery

## 2018-07-22 ENCOUNTER — Ambulatory Visit (HOSPITAL_COMMUNITY): Payer: BLUE CROSS/BLUE SHIELD

## 2018-07-22 ENCOUNTER — Encounter (HOSPITAL_BASED_OUTPATIENT_CLINIC_OR_DEPARTMENT_OTHER): Payer: Self-pay

## 2018-07-22 ENCOUNTER — Ambulatory Visit (HOSPITAL_BASED_OUTPATIENT_CLINIC_OR_DEPARTMENT_OTHER): Payer: BLUE CROSS/BLUE SHIELD | Admitting: Anesthesiology

## 2018-07-22 DIAGNOSIS — G4700 Insomnia, unspecified: Secondary | ICD-10-CM | POA: Insufficient documentation

## 2018-07-22 DIAGNOSIS — Z888 Allergy status to other drugs, medicaments and biological substances status: Secondary | ICD-10-CM | POA: Diagnosis not present

## 2018-07-22 DIAGNOSIS — Z7982 Long term (current) use of aspirin: Secondary | ICD-10-CM | POA: Insufficient documentation

## 2018-07-22 DIAGNOSIS — G8918 Other acute postprocedural pain: Secondary | ICD-10-CM | POA: Diagnosis not present

## 2018-07-22 DIAGNOSIS — Z79899 Other long term (current) drug therapy: Secondary | ICD-10-CM | POA: Diagnosis not present

## 2018-07-22 DIAGNOSIS — Z87442 Personal history of urinary calculi: Secondary | ICD-10-CM | POA: Diagnosis not present

## 2018-07-22 DIAGNOSIS — I10 Essential (primary) hypertension: Secondary | ICD-10-CM | POA: Diagnosis not present

## 2018-07-22 DIAGNOSIS — K219 Gastro-esophageal reflux disease without esophagitis: Secondary | ICD-10-CM | POA: Diagnosis not present

## 2018-07-22 DIAGNOSIS — E785 Hyperlipidemia, unspecified: Secondary | ICD-10-CM | POA: Diagnosis not present

## 2018-07-22 DIAGNOSIS — F419 Anxiety disorder, unspecified: Secondary | ICD-10-CM | POA: Insufficient documentation

## 2018-07-22 DIAGNOSIS — W109XXA Fall (on) (from) unspecified stairs and steps, initial encounter: Secondary | ICD-10-CM | POA: Diagnosis not present

## 2018-07-22 DIAGNOSIS — Z791 Long term (current) use of non-steroidal anti-inflammatories (NSAID): Secondary | ICD-10-CM | POA: Diagnosis not present

## 2018-07-22 DIAGNOSIS — Z419 Encounter for procedure for purposes other than remedying health state, unspecified: Secondary | ICD-10-CM

## 2018-07-22 DIAGNOSIS — F329 Major depressive disorder, single episode, unspecified: Secondary | ICD-10-CM | POA: Insufficient documentation

## 2018-07-22 DIAGNOSIS — E119 Type 2 diabetes mellitus without complications: Secondary | ICD-10-CM | POA: Diagnosis not present

## 2018-07-22 DIAGNOSIS — S82841A Displaced bimalleolar fracture of right lower leg, initial encounter for closed fracture: Secondary | ICD-10-CM | POA: Diagnosis not present

## 2018-07-22 DIAGNOSIS — Z886 Allergy status to analgesic agent status: Secondary | ICD-10-CM | POA: Insufficient documentation

## 2018-07-22 HISTORY — PX: ORIF ANKLE FRACTURE: SHX5408

## 2018-07-22 HISTORY — DX: Personal history of urinary calculi: Z87.442

## 2018-07-22 LAB — GLUCOSE, CAPILLARY
Glucose-Capillary: 177 mg/dL — ABNORMAL HIGH (ref 70–99)
Glucose-Capillary: 149 mg/dL — ABNORMAL HIGH (ref 70–99)

## 2018-07-22 SURGERY — OPEN REDUCTION INTERNAL FIXATION (ORIF) ANKLE FRACTURE
Anesthesia: Regional | Site: Ankle | Laterality: Right

## 2018-07-22 MED ORDER — EPHEDRINE SULFATE 50 MG/ML IJ SOLN
INTRAMUSCULAR | Status: DC | PRN
  Administered 2018-07-22 (×2): 15 mg via INTRAVENOUS

## 2018-07-22 MED ORDER — FENTANYL CITRATE (PF) 100 MCG/2ML IJ SOLN
INTRAMUSCULAR | Status: AC
  Filled 2018-07-22: qty 2

## 2018-07-22 MED ORDER — PHENYLEPHRINE HCL 10 MG/ML IJ SOLN
INTRAMUSCULAR | Status: DC | PRN
  Administered 2018-07-22 (×3): 120 ug via INTRAVENOUS
  Administered 2018-07-22: 200 ug via INTRAVENOUS
  Administered 2018-07-22: 120 ug via INTRAVENOUS

## 2018-07-22 MED ORDER — LACTATED RINGERS IV SOLN
INTRAVENOUS | Status: DC
  Administered 2018-07-22: 10:00:00 via INTRAVENOUS

## 2018-07-22 MED ORDER — ONDANSETRON HCL 4 MG/2ML IJ SOLN
INTRAMUSCULAR | Status: AC
  Filled 2018-07-22: qty 2

## 2018-07-22 MED ORDER — FENTANYL CITRATE (PF) 100 MCG/2ML IJ SOLN
50.0000 ug | INTRAMUSCULAR | Status: DC | PRN
  Administered 2018-07-22: 50 ug via INTRAVENOUS

## 2018-07-22 MED ORDER — PROPOFOL 10 MG/ML IV BOLUS
INTRAVENOUS | Status: DC | PRN
  Administered 2018-07-22: 200 mg via INTRAVENOUS

## 2018-07-22 MED ORDER — LIDOCAINE HCL (CARDIAC) PF 100 MG/5ML IV SOSY
PREFILLED_SYRINGE | INTRAVENOUS | Status: DC | PRN
  Administered 2018-07-22: 100 mg via INTRAVENOUS

## 2018-07-22 MED ORDER — MIDAZOLAM HCL 2 MG/2ML IJ SOLN
INTRAMUSCULAR | Status: AC
  Filled 2018-07-22: qty 2

## 2018-07-22 MED ORDER — LIDOCAINE 2% (20 MG/ML) 5 ML SYRINGE
INTRAMUSCULAR | Status: AC
  Filled 2018-07-22: qty 5

## 2018-07-22 MED ORDER — CLONIDINE HCL (ANALGESIA) 100 MCG/ML EP SOLN
EPIDURAL | Status: DC | PRN
  Administered 2018-07-22 (×2): 50 ug

## 2018-07-22 MED ORDER — OXYCODONE HCL 5 MG PO TABS: 5.0000 mg | ORAL_TABLET | ORAL | 0 refills | Status: AC | PRN

## 2018-07-22 MED ORDER — ACETAMINOPHEN 500 MG PO TABS
ORAL_TABLET | ORAL | Status: AC
  Filled 2018-07-22: qty 2

## 2018-07-22 MED ORDER — FENTANYL CITRATE (PF) 100 MCG/2ML IJ SOLN: 25.0000 ug | INTRAMUSCULAR | Status: DC | PRN

## 2018-07-22 MED ORDER — CEFAZOLIN SODIUM-DEXTROSE 2-4 GM/100ML-% IV SOLN: 2.0000 g | INTRAVENOUS | Status: DC

## 2018-07-22 MED ORDER — CEFAZOLIN SODIUM-DEXTROSE 2-4 GM/100ML-% IV SOLN
INTRAVENOUS | Status: AC
  Filled 2018-07-22: qty 100

## 2018-07-22 MED ORDER — POVIDONE-IODINE 7.5 % EX SOLN: Freq: Once | CUTANEOUS | Status: DC

## 2018-07-22 MED ORDER — SCOPOLAMINE 1 MG/3DAYS TD PT72: 1.0000 | MEDICATED_PATCH | Freq: Once | TRANSDERMAL | Status: DC | PRN

## 2018-07-22 MED ORDER — ACETAMINOPHEN 500 MG PO TABS
1000.0000 mg | ORAL_TABLET | Freq: Once | ORAL | Status: AC
  Administered 2018-07-22: 1000 mg via ORAL

## 2018-07-22 MED ORDER — ONDANSETRON HCL 4 MG/2ML IJ SOLN
INTRAMUSCULAR | Status: DC | PRN
  Administered 2018-07-22: 4 mg via INTRAVENOUS

## 2018-07-22 MED ORDER — DEXAMETHASONE SODIUM PHOSPHATE 10 MG/ML IJ SOLN
INTRAMUSCULAR | Status: AC
  Filled 2018-07-22: qty 1

## 2018-07-22 MED ORDER — PROPOFOL 10 MG/ML IV BOLUS
INTRAVENOUS | Status: AC
  Filled 2018-07-22: qty 40

## 2018-07-22 MED ORDER — MIDAZOLAM HCL 2 MG/2ML IJ SOLN
1.0000 mg | INTRAMUSCULAR | Status: DC | PRN
  Administered 2018-07-22: 10:00:00 2 mg via INTRAVENOUS

## 2018-07-22 MED ORDER — ROPIVACAINE HCL 5 MG/ML IJ SOLN
INTRAMUSCULAR | Status: DC | PRN
  Administered 2018-07-22: 25 mL via PERINEURAL
  Administered 2018-07-22: 15 mL via PERINEURAL

## 2018-07-22 SURGICAL SUPPLY — 85 items
APL PRP STRL LF DISP 70% ISPRP (MISCELLANEOUS) ×2
APL SKNCLS STERI-STRIP NONHPOA (GAUZE/BANDAGES/DRESSINGS)
BANDAGE ACE 4X5 VEL STRL LF (GAUZE/BANDAGES/DRESSINGS) ×4 IMPLANT
BANDAGE ACE 6X5 VEL STRL LF (GAUZE/BANDAGES/DRESSINGS) ×4 IMPLANT
BANDAGE ESMARK 6X9 LF (GAUZE/BANDAGES/DRESSINGS) ×2 IMPLANT
BENZOIN TINCTURE PRP APPL 2/3 (GAUZE/BANDAGES/DRESSINGS) IMPLANT
BIT DRILL 2 CANN GRADUATED (BIT) ×3 IMPLANT
BIT DRILL 2.5 CANN LNG (BIT) ×3 IMPLANT
BIT DRILL 2.6 CANN (BIT) ×3 IMPLANT
BIT DRILL 3.5 CANN STRL (BIT) ×3 IMPLANT
BLADE SURG 15 STRL LF DISP TIS (BLADE) ×4 IMPLANT
BLADE SURG 15 STRL SS (BLADE) ×8
BNDG CMPR 9X4 STRL LF SNTH (GAUZE/BANDAGES/DRESSINGS)
BNDG CMPR 9X6 STRL LF SNTH (GAUZE/BANDAGES/DRESSINGS) ×2
BNDG COHESIVE 4X5 TAN STRL (GAUZE/BANDAGES/DRESSINGS) IMPLANT
BNDG ESMARK 4X9 LF (GAUZE/BANDAGES/DRESSINGS) IMPLANT
BNDG ESMARK 6X9 LF (GAUZE/BANDAGES/DRESSINGS) ×4
CHLORAPREP W/TINT 26 (MISCELLANEOUS) ×4 IMPLANT
CLOSURE WOUND 1/2 X4 (GAUZE/BANDAGES/DRESSINGS)
COVER BACK TABLE REUSABLE LG (DRAPES) ×4 IMPLANT
COVER WAND RF STERILE (DRAPES) IMPLANT
CUFF TOURN SGL QUICK 34 (TOURNIQUET CUFF) ×4
CUFF TRNQT CYL 34X4.125X (TOURNIQUET CUFF) ×2 IMPLANT
DECANTER SPIKE VIAL GLASS SM (MISCELLANEOUS) IMPLANT
DRAPE C-ARM 42X72 X-RAY (DRAPES) ×3 IMPLANT
DRAPE C-ARMOR (DRAPES) ×3 IMPLANT
DRAPE EXTREMITY T 121X128X90 (DISPOSABLE) ×4 IMPLANT
DRAPE IMP U-DRAPE 54X76 (DRAPES) ×4 IMPLANT
DRAPE OEC MINIVIEW 54X84 (DRAPES) IMPLANT
DRAPE U-SHAPE 47X51 STRL (DRAPES) ×4 IMPLANT
ELECT REM PT RETURN 9FT ADLT (ELECTROSURGICAL) ×4
ELECTRODE REM PT RTRN 9FT ADLT (ELECTROSURGICAL) ×2 IMPLANT
GAUZE SPONGE 4X4 12PLY STRL (GAUZE/BANDAGES/DRESSINGS) ×4 IMPLANT
GAUZE XEROFORM 1X8 LF (GAUZE/BANDAGES/DRESSINGS) ×4 IMPLANT
GLOVE BIO SURGEON STRL SZ7 (GLOVE) ×3 IMPLANT
GLOVE BIO SURGEON STRL SZ7.5 (GLOVE) ×4 IMPLANT
GLOVE BIOGEL PI IND STRL 6.5 (GLOVE) ×1 IMPLANT
GLOVE BIOGEL PI IND STRL 7.0 (GLOVE) ×1 IMPLANT
GLOVE BIOGEL PI IND STRL 7.5 (GLOVE) ×1 IMPLANT
GLOVE BIOGEL PI IND STRL 8 (GLOVE) ×3 IMPLANT
GLOVE BIOGEL PI INDICATOR 6.5 (GLOVE) ×2
GLOVE BIOGEL PI INDICATOR 7.0 (GLOVE) ×2
GLOVE BIOGEL PI INDICATOR 7.5 (GLOVE) ×2
GLOVE BIOGEL PI INDICATOR 8 (GLOVE) ×4
GLOVE SURG SYN 7.5  E (GLOVE) ×2
GLOVE SURG SYN 7.5 E (GLOVE) ×2 IMPLANT
GLOVE SURG SYN 7.5 PF PI (GLOVE) IMPLANT
GOWN STRL REUS W/ TWL LRG LVL3 (GOWN DISPOSABLE) ×2 IMPLANT
GOWN STRL REUS W/ TWL XL LVL3 (GOWN DISPOSABLE) ×3 IMPLANT
GOWN STRL REUS W/TWL LRG LVL3 (GOWN DISPOSABLE) ×4
GOWN STRL REUS W/TWL XL LVL3 (GOWN DISPOSABLE) ×8
GUIDEWIRE 1.35MM (WIRE) ×6 IMPLANT
NS IRRIG 1000ML POUR BTL (IV SOLUTION) ×4 IMPLANT
PACK BASIN DAY SURGERY FS (CUSTOM PROCEDURE TRAY) ×4 IMPLANT
PAD CAST 4YDX4 CTTN HI CHSV (CAST SUPPLIES) ×1 IMPLANT
PADDING CAST COTTON 4X4 STRL (CAST SUPPLIES)
PADDING CAST SYNTHETIC 4 (CAST SUPPLIES) ×4
PADDING CAST SYNTHETIC 4X4 STR (CAST SUPPLIES) ×3 IMPLANT
PENCIL BUTTON HOLSTER BLD 10FT (ELECTRODE) ×4 IMPLANT
PLATE 5HOLE LOCKING 91MML (Plate) ×3 IMPLANT
SCREW CANCELLOUS 3MM 3X14MM (Screw) ×3 IMPLANT
SCREW CORTEX LP TM SS 2.7X16 (Screw) ×3 IMPLANT
SCREW LOCKING 2.7X10 ANKLE (Screw) ×3 IMPLANT
SCREW LOCKING 2.7X14MM (Screw) ×3 IMPLANT
SCREW LOW PROFILE 4.0X40 (Screw) ×6 IMPLANT
SCREW NON-LOCKING 3.5X12MM (Screw) ×9 IMPLANT
SCREW NON-LOCKING 3.5X22MM (Screw) ×3 IMPLANT
SLEEVE SCD COMPRESS KNEE MED (MISCELLANEOUS) ×4 IMPLANT
SPLINT FIBERGLASS 4X30 (CAST SUPPLIES) ×4 IMPLANT
SPONGE LAP 18X18 RF (DISPOSABLE) ×3 IMPLANT
STAPLER VISISTAT 35W (STAPLE) IMPLANT
STOCKINETTE 6  STRL (DRAPES) ×2
STOCKINETTE 6 STRL (DRAPES) ×2 IMPLANT
STRIP CLOSURE SKIN 1/2X4 (GAUZE/BANDAGES/DRESSINGS) IMPLANT
SUCTION FRAZIER HANDLE 10FR (MISCELLANEOUS) ×2
SUCTION TUBE FRAZIER 10FR DISP (MISCELLANEOUS) ×2 IMPLANT
SUT ETHILON 3 0 PS 1 (SUTURE) ×7 IMPLANT
SUT MNCRL AB 3-0 PS2 18 (SUTURE) ×10 IMPLANT
SUT PDS AB 2-0 CT2 27 (SUTURE) ×4 IMPLANT
SUT VIC AB 3-0 FS2 27 (SUTURE) IMPLANT
SYR BULB 3OZ (MISCELLANEOUS) ×4 IMPLANT
TOWEL GREEN STERILE FF (TOWEL DISPOSABLE) ×8 IMPLANT
TUBE CONNECTING 20'X1/4 (TUBING) ×1
TUBE CONNECTING 20X1/4 (TUBING) ×3 IMPLANT
UNDERPAD 30X30 (UNDERPADS AND DIAPERS) ×4 IMPLANT

## 2018-07-22 NOTE — Anesthesia Procedure Notes (Signed)
Procedure Name: LMA Insertion Performed by: Verita Lamb, CRNA Pre-anesthesia Checklist: Patient identified, Emergency Drugs available, Suction available and Patient being monitored Patient Re-evaluated:Patient Re-evaluated prior to induction Oxygen Delivery Method: Circle system utilized Preoxygenation: Pre-oxygenation with 100% oxygen Induction Type: IV induction LMA: LMA inserted LMA Size: 4.0 Tube type: Oral Number of attempts: 1 Placement Confirmation: positive ETCO2,  CO2 detector and breath sounds checked- equal and bilateral Tube secured with: Tape Dental Injury: Teeth and Oropharynx as per pre-operative assessment

## 2018-07-22 NOTE — Anesthesia Postprocedure Evaluation (Signed)
Anesthesia Post Note  Patient: Meghan Mcdonald  Procedure(s) Performed: OPEN REDUCTION INTERNAL FIXATION (ORIF) RIGHT BIMALL ANKLE FRACTURE (Right Ankle)     Patient location during evaluation: PACU Anesthesia Type: Regional and General Level of consciousness: awake and alert Pain management: pain level controlled Vital Signs Assessment: post-procedure vital signs reviewed and stable Respiratory status: spontaneous breathing, nonlabored ventilation, respiratory function stable and patient connected to nasal cannula oxygen Cardiovascular status: blood pressure returned to baseline and stable Postop Assessment: no apparent nausea or vomiting Anesthetic complications: no    Last Vitals:  Vitals:   07/22/18 1230 07/22/18 1245  BP: 119/60 (!) 116/53  Pulse: 73 77  Resp: 10 11  Temp:    SpO2: 97% 95%    Last Pain:  Vitals:   07/22/18 1245  TempSrc:   PainSc: 0-No pain                 Myldred Raju L Maxie Slovacek

## 2018-07-22 NOTE — Discharge Instructions (Signed)
DR. Lucia Gaskins FOOT & ANKLE SURGERY POST-OP INSTRUCTIONS   Pain Management 1. The numbing medicine and your leg will last around 8 hours, take a dose of your pain medicine as soon as you feel it wearing off to avoid rebound pain. 2. Keep your foot elevated above heart level.  Make sure that your heel hangs free ('floats'). 3. Take all prescribed medication as directed. 4. If taking narcotic pain medication you may want to use an over-the-counter stool softener to avoid constipation. 5. You may take over-the-counter NSAIDs (ibuprofen, naproxen, etc.) as well as over-the-counter acetaminophen as directed on the packaging as a supplement for your pain and may also use it to wean away from the prescription medication.  Activity ? Non-weightbearing ? Postoperatively, you will be placed into a splint which stays on for 2 weeks and then will be changed at your first postop visit.  First Postoperative Visit 1. Your first postop visit will be at least 2 weeks after surgery.  This should be scheduled when you schedule surgery. 2. If you do not have a postoperative visit scheduled please call [phone] to schedule an appointment. 3. At the appointment your incision will be evaluated for suture removal, x-rays will be obtained if necessary.  General Instructions 1. Swelling is very common after foot and ankle surgery.  It often takes 3 months for the foot and ankle to begin to feel comfortable.  Some amount of swelling will persist for 6-12 months. 2. DO NOT change the dressing.  If there is a problem with the dressing (too tight, loose, gets wet, etc.) please contact Dr. Pollie Friar office. 3. DO NOT get the dressing wet.  For showers you can use an over-the-counter cast cover or wrap a washcloth around the top of your dressing and then cover it with a plastic bag and tape it to your leg. 4. DO NOT soak the incision (no tubs, pools, bath, etc.) until you have approval from Dr. Lucia Gaskins.  Contact Dr. Huel Cote  office or go to Emergency Room if: 1. Temperature above 101 F. 2. Increasing pain that is unresponsive to pain medication or elevation 3. Excessive redness or swelling in your foot 4. Dressing problems - excessive bloody drainage, looseness or tightness, or if dressing gets wet 5. Develop pain, swelling, warmth, or discoloration of your calf Regional Anesthesia Blocks  1. Numbness or the inability to move the "blocked" extremity may last from 3-48 hours after placement. The length of time depends on the medication injected and your individual response to the medication. If the numbness is not going away after 48 hours, call your surgeon.  2. The extremity that is blocked will need to be protected until the numbness is gone and the  Strength has returned. Because you cannot feel it, you will need to take extra care to avoid injury. Because it may be weak, you may have difficulty moving it or using it. You may not know what position it is in without looking at it while the block is in effect.  3. For blocks in the legs and feet, returning to weight bearing and walking needs to be done carefully. You will need to wait until the numbness is entirely gone and the strength has returned. You should be able to move your leg and foot normally before you try and bear weight or walk. You will need someone to be with you when you first try to ensure you do not fall and possibly risk injury.  4. Bruising and tenderness  at the needle site are common side effects and will resolve in a few days.  5. Persistent numbness or new problems with movement should be communicated to the surgeon or the [facility] [phone] [place] [phone]). Post Anesthesia Home Care Instructions  Activity: Get plenty of rest for the remainder of the day. A responsible individual must stay with you for 24 hours following the procedure.  For the next 24 hours, DO NOT: -Drive a car -Conservation officer, nature -Drink alcoholic beverages -Take any medication unless instructed by your physician -Make any legal decisions or sign important papers.  Meals: Start with liquid foods such as gelatin or soup. Progress to regular foods as tolerated. Avoid greasy, spicy, heavy foods. If nausea and/or vomiting occur, drink only clear liquids until the nausea and/or vomiting subsides. Call your physician if vomiting continues.  Special Instructions/Symptoms: Your throat may feel dry or sore from the anesthesia or the breathing tube placed in your throat during surgery. If this causes discomfort, gargle with warm salt water. The discomfort should disappear within 24 hours.  If you had a scopolamine patch placed behind your ear for the management of post- operative nausea and/or vomiting:  1. The medication in the patch is effective for 72 hours, after which it should be removed.  Wrap patch in a tissue and discard in the trash. Wash hands thoroughly with soap and water. 2. You may remove the patch earlier than 72 hours if you experience unpleasant side effects which may include dry mouth, dizziness or visual disturbances. 3. Avoid touching the patch. Wash your hands with soap and water after contact with the patch.

## 2018-07-22 NOTE — Op Note (Signed)
Meghan Mcdonald female 63 y.o. 07/22/2018  PreOperative Diagnosis: Right bimalleolar ankle fracture  PostOperative Diagnosis: Same  PROCEDURE: Open reduction internal fixation right bimalleolar ankle fracture  SURGEON: Melony Overly, MD  ASSISTANT: Jeani Hawking, RNFA  ANESTHESIA: General LMA anesthesia with popliteal and abductor canal block by anesthesia  FINDINGS: Right bimalleolar ankle fracture Small area of cartilage damage on the medial talar dome without cartilage loss or loose cartilage  IMPLANTS: Arthrex distal fibular locking plate 4.0 mm partially-threaded cannulated screws  INDICATIONS:62 y.o. female fell down the stairs on 07/12/2018 and sustained the above injury.  She was seen in the emergency department placed in the splint.  She was sent to my office for evaluation.  Patient was maintaining nonweightbearing.  She had a recent diagnosis of diabetes but did not know her hemoglobin A1c and was not instructed by her primary care physician to check her blood sugars.  At baseline she took an 81 mg aspirin.  Given the displacement and unstable ankle fracture pattern she was indicated for open treatment.  After discussion of the risk, benefits alternatives which included but were not limited to wound healing complication, infection, nonunion, malunion, need for further surgery, development of arthritis continued or continued pain she opted to proceed with surgery.  We also discussed the perioperative anesthetic risk which included death.  She understood the weightbearing restrictions postoperatively and agreed to comply.  PROCEDURE: Patient was identified the preoperative holding area.  The right leg was marked by myself.  Consent was signed by myself and the patient.  Peripheral nerve blocks were performed by anesthesia.  She was taken the operative suite placed supine the operative table.  Thigh tourniquet was placed on the right thigh after induction of general LMA anesthesia without  difficulty.  Preoperative antibiotics were given.  All bony prominences well-padded and a bump was placed under the right hip.  Bone foam was used.  The leg was elevated and the tourniquet was inflated to 250 mmHg.  A surgical timeout was performed.  We began by making a curvilinear incision overlying the medial malleolus fracture that was palpable.  This was taken sharply down through skin and subcutaneous tissue.  Then blunt dissection was used to mobilize the saphenous vein anteriorly.  Then the fracture site was identified and entered sharply.  The periosteal tissue that was interposed within the fracture site was removed sharply with 15 blade and with a rondure.  The bony ends were cleaned of fracture hematoma using a curette and rondure.  Then the medial malleolar fragment was manipulated distally and I was able to peer into the ankle joint.  There was some fracture hematoma within the medial gutter that was removed with a rondure.  There was evidence of cartilage impaction in the medial talar dome without loose cartilaginous flaps.  The joint was irrigated with normal saline.  Then turned my attention to the lateral side.  A longitudinal incision was made overlying the fibula fracture.  This taken sharply down through skin subcutaneous tissue and then using dissecting scissors the soft tissue was mobilized in an attempt to identify any branches of the superficial peroneal nerve which were not visualized surgical field.  Then anterior and lateral flaps were created in an extraperiosteal fashion overlying the fracture site.  The fracture site was entered sharply and the periosteal tissue was removed with a 15 blade and rondure.  Then the fracture site was opened up and cleaned of fracture hematoma.  Mobilizing the distal fragment in order to obtain  reduction.  Then using a lobster claw under direct visualization and manual manipulation the fracture of the fibula was reduced into near anatomic position.  This  was held in place provisionally with a lobster claw.  Then a 3.5 mm lag screw was placed in a lag by technique fashion.  Then fluoroscopy was used to confirm appropriate lag screw length and maintenance of reduction.  Fibula appeared to have good length and rotation.  Then a distal fibular locking plate was placed with a combination of locking and nonlocking screws.  Again fluoroscopy was used to assess maintenance of fracture reduction and length of screws.  Then turned our attention back to the medial malleolar fragment.  This was reduced under direct visualization and held provisionally with a pointed reduction clamp.  Then the guidewires for the 4.0 mm cannulated screws were placed and confirmed to be in appropriate position on fluoroscopy.  Maintenance of reduction was obtained and found to be acceptable.  Then 2 partially-threaded 4 oh cannulated screws were placed obtaining good bite with both of them.  The guide pins were removed and final x-rays were obtained.  During final x-ray stress views were obtained looking for medial clear space widening or widening of the syndesmosis which were both found to be stable.  The syndesmosis was also checked under direct visualization using a Valora Corporal just proximal to the syndesmosis and looking for any instability there.  There is no instability noted.  The wounds were then irrigated with normal saline.  The tourniquet was released.  Tourniquet time was 1 hour and 3 minutes.  Hemostasis was obtained with Bovie cautery and direct pressure.  The deep tissue overlying the fibular plate was closed with a 2-0 PDS.  The subcuticular tissue was closed with 3-0 Monocryl and the skin with 3-0 nylon in an interrupted fashion on both the medial lateral sides.    Xeroform, 4 x 4's and sterile she cotton was placed.  She was placed in a nonweightbearing short leg splint.  Counts were correct at the end the case.  There were no complications.  She was awakened from anesthesia and  taken to recovery room in stable condition.  POST OPERATIVE INSTRUCTIONS: Nonweightbearing right lower extremity Keep leg and splint dry Follow-up in 2 weeks for splint removal, wound check and x-rays. May leave sutures in longer given her diabetes status. She will resume her 81 mg aspirin daily. Call the office with concerns.  TOURNIQUET TIME: 1 hour and 3 minutes  BLOOD LOSS:  Minimal         DRAINS: none         SPECIMEN: none       COMPLICATIONS:  * No complications entered in OR log *         Disposition: PACU - hemodynamically stable.         Condition: stable

## 2018-07-22 NOTE — H&P (Signed)
Meghan Mcdonald is an 63 y.o. female.   Chief Complaint: Right bimalleolar ankle fracture HPI: Patient is a 63 year old female with recent diagnosis of diabetes however does not know her A1c and was not recommended to check her blood sugars by her primary care physician sustained a right bimalleolar ankle fracture after falling down the stairs on 07/12/2018.  She was seen in the emergency department initially and reduction performed and she was placed in a splint.  She was seen in my office.  She was indicated for open treatment of her ankle fracture given the displacement and instability.  Patient is here today for surgical intervention.  She has been maintaining nonweightbearing and tolerated the splint well.  She denies any numbness or tingling in her right lower extremity.  She denies any other joint or extremity pain today.  At baseline patient takes an 81 mg aspirin.  Past Medical History:  Diagnosis Date  . Chronic shoulder pain   . Depression   . GERD (gastroesophageal reflux disease)   . History of kidney stones   . Hyperlipemia   . Hypertension   . Insomnia   . Migraine   . Overweight   . Rapid palpitations     Past Surgical History:  Procedure Laterality Date  . BLADDER SURGERY    . CARPAL TUNNEL RELEASE    . CHOLECYSTECTOMY    . TONSILLECTOMY      Family History  Problem Relation Age of Onset  . Hypertension Mother   . Hypertension Father    Social History:  reports that she has never smoked. She has never used smokeless tobacco. She reports current alcohol use. She reports that she does not use drugs.  Allergies:  Allergies  Allergen Reactions  . Amitriptyline Other (See Comments)    oversedation  . Aspirin Other (See Comments)    Severe Blood Thinning  . Micardis [Telmisartan] Other (See Comments)    unknown  . Hibiclens [Chlorhexidine Gluconate] Rash    Skin blisters  . Loestrin [Norethindrone Acet-Ethinyl Est] Rash  . Prednisone Rash    Medications Prior to  Admission  Medication Sig Dispense Refill  . acetaminophen (TYLENOL) 500 MG tablet Take 1,000 mg by mouth as needed for moderate pain.    Marland Kitchen aspirin EC 81 MG EC tablet Take 1 tablet (81 mg total) by mouth daily. 30 tablet 0  . atorvastatin (LIPITOR) 80 MG tablet Take 1 tablet (80 mg total) by mouth daily at 6 PM. 30 tablet 0  . famotidine (PEPCID) 20 MG tablet Take 20 mg by mouth daily.    . hydrochlorothiazide (HYDRODIURIL) 25 MG tablet Take 25 mg by mouth daily.    Marland Kitchen HYDROcodone-acetaminophen (NORCO) 5-325 MG tablet Take 1 tablet by mouth every 6 (six) hours as needed for severe pain. 15 tablet 0  . meloxicam (MOBIC) 15 MG tablet     . metoprolol tartrate (LOPRESSOR) 50 MG tablet Take 50 mg by mouth 2 (two) times daily.    . Multiple Vitamins-Minerals (PRESERVISION AREDS 2+MULTI VIT PO) Take by mouth.    . Omega-3 Fatty Acids (FISH OIL) 1000 MG CAPS Take by mouth.    . ramipril (ALTACE) 10 MG capsule Take 10 mg by mouth 2 (two) times daily.    Marland Kitchen venlafaxine XR (EFFEXOR-XR) 75 MG 24 hr capsule Take 75 mg by mouth at bedtime.     . diazepam (VALIUM) 5 MG tablet Take 1 tablet (5 mg total) by mouth every 8 (eight) hours as needed (nausea and  dizziness). 15 tablet 0  . meclizine (ANTIVERT) 25 MG tablet Take 2 tablets (50 mg total) by mouth 3 (three) times daily. 30 tablet 0  . ondansetron (ZOFRAN) 8 MG tablet Take 1 tablet (8 mg total) by mouth every 8 (eight) hours as needed for refractory nausea / vomiting. 15 tablet 0  . Topiramate ER (TROKENDI XR) 50 MG CP24 Take 1 tablet by mouth daily. 30 capsule 2  . venlafaxine XR (EFFEXOR-XR) 75 MG 24 hr capsule Take by mouth.    . vitamin B-12 (CYANOCOBALAMIN) 100 MCG tablet Take by mouth.      Results for orders placed or performed during the hospital encounter of 07/22/18 (from the past 48 hour(s))  Glucose, capillary     Status: Abnormal   Collection Time: 07/22/18  9:12 AM  Result Value Ref Range   Glucose-Capillary 177 (H) 70 - 99 mg/dL   No  results found.  Review of Systems  Constitutional: Negative.   HENT: Negative.   Eyes: Negative.   Respiratory: Negative.   Cardiovascular: Negative.   Gastrointestinal: Negative.   Musculoskeletal:       Right ankle pain  Skin: Negative.   Neurological: Negative.   Psychiatric/Behavioral: Negative.     Blood pressure 128/63, pulse 63, temperature 98.3 F (36.8 C), temperature source Oral, resp. rate 11, height 5\' 1"  (1.549 m), weight 79.6 kg, SpO2 99 %. Physical Exam  Constitutional: She appears well-developed.  HENT:  Head: Normocephalic.  Eyes: Conjunctivae are normal.  Neck: Neck supple.  Cardiovascular: Normal rate.  Respiratory: Effort normal.  GI: Soft.  Musculoskeletal:     Comments: Right ankle demonstrates swelling and ecchymosis out of the splint.  There is deformity at the ankle.  No skin lacerations or open wounds.  Ecchymosis extends proximally about the leg.  Patient is able to wiggle toes.  Endorses sensation on the dorsal plantar surface of the foot.  Palpable dorsalis pedis pulse.  No tenderness proximal at the knee or fibular region.  No tenderness to palpation proximally about the leg.  Did not assess motor at the ankle given the injury.  Neurological: She is alert.  Skin: Skin is warm.  Psychiatric: She has a normal mood and affect.     Assessment/Plan We will proceed with open treatment of her ankle fracture.  May require open treatment of her syndesmosis after stressing the ankle in the operating room.  We discussed the risk, benefits and alternatives to surgery which include but are not limited to wound healing complications, infection, nonunion, malunion, continued pain, development of arthritis, stiffness, damage surrounding structures and need for further surgery.  We also discussed the increased risk of wound complications and other perioperative complications given her history of diabetes.  She understands and accepts the risk.  We also discussed the  perioperative and anesthetic risk which include death.  She understands the weightbearing restrictions and agrees to comply.  Postoperatively she will resume her 81 mg aspirin.  She wishes to proceed with surgery.  Erle Crocker, MD 07/22/2018, 9:28 AM

## 2018-07-22 NOTE — Anesthesia Procedure Notes (Signed)
Anesthesia Regional Block: Popliteal block   Pre-Anesthetic Checklist: ,, timeout performed, Correct Patient, Correct Site, Correct Laterality, Correct Procedure, Correct Position, site marked, Risks and benefits discussed,  Surgical consent,  Pre-op evaluation,  At surgeon's request and post-op pain management  Laterality: Right  Prep: Maximum Sterile Barrier Precautions used, chloraprep       Needles:  Injection technique: Single-shot  Needle Type: Echogenic Stimulator Needle     Needle Length: 9cm  Needle Gauge: 22     Additional Needles:   Procedures:,,,, ultrasound used (permanent image in chart),,,,  Narrative:  Start time: 07/22/2018 9:26 AM End time: 07/22/2018 9:36 AM Injection made incrementally with aspirations every 5 mL.  Performed by: Personally  Anesthesiologist: Freddrick March, MD  Additional Notes: Monitors applied. No increased pain on injection. No increased resistance to injection. Injection made in 5cc increments. Good needle visualization. Patient tolerated procedure well.

## 2018-07-22 NOTE — Anesthesia Procedure Notes (Signed)
Anesthesia Regional Block: Adductor canal block   Pre-Anesthetic Checklist: ,, timeout performed, Correct Patient, Correct Site, Correct Laterality, Correct Procedure, Correct Position, site marked, Risks and benefits discussed,  Surgical consent,  Pre-op evaluation,  At surgeon's request and post-op pain management  Laterality: Right  Prep: Maximum Sterile Barrier Precautions used, chloraprep       Needles:  Injection technique: Single-shot  Needle Type: Echogenic Stimulator Needle     Needle Length: 9cm  Needle Gauge: 22     Additional Needles:   Procedures:,,,, ultrasound used (permanent image in chart),,,,  Narrative:  Start time: 07/22/2018 9:37 AM End time: 07/22/2018 9:47 AM Injection made incrementally with aspirations every 5 mL.  Performed by: Personally  Anesthesiologist: Freddrick March, MD  Additional Notes: Monitors applied. No increased pain on injection. No increased resistance to injection. Injection made in 5cc increments. Good needle visualization. Patient tolerated procedure well.

## 2018-07-22 NOTE — Transfer of Care (Signed)
Immediate Anesthesia Transfer of Care Note  Patient: Meghan Mcdonald  Procedure(s) Performed: OPEN REDUCTION INTERNAL FIXATION (ORIF) RIGHT BIMALL ANKLE FRACTURE (Right Ankle)  Patient Location: PACU  Anesthesia Type:General  Level of Consciousness: awake, alert  and oriented  Airway & Oxygen Therapy: Patient Spontanous Breathing and Patient connected to face mask oxygen  Post-op Assessment: Report given to RN and Post -op Vital signs reviewed and stable  Post vital signs: Reviewed and stable  Last Vitals:  Vitals Value Taken Time  BP 123/74 07/22/2018 11:54 AM  Temp    Pulse 75 07/22/2018 11:55 AM  Resp 12 07/22/2018 11:55 AM  SpO2 100 % 07/22/2018 11:55 AM  Vitals shown include unvalidated device data.  Last Pain:  Vitals:   07/22/18 0852  TempSrc: Oral  PainSc: 1       Patients Stated Pain Goal: 3 (99/23/41 4436)  Complications: No apparent anesthesia complications

## 2018-07-22 NOTE — Progress Notes (Signed)
Assisted D. Woodrum with right, ultrasound guided, popliteal, adductor canal block. Side rails up, monitors on throughout procedure. See vital signs in flow sheet. Tolerated Procedure well.

## 2018-07-23 ENCOUNTER — Encounter (HOSPITAL_BASED_OUTPATIENT_CLINIC_OR_DEPARTMENT_OTHER): Payer: Self-pay | Admitting: Orthopaedic Surgery

## 2018-08-04 DIAGNOSIS — S82841A Displaced bimalleolar fracture of right lower leg, initial encounter for closed fracture: Secondary | ICD-10-CM | POA: Diagnosis not present

## 2018-08-12 DIAGNOSIS — I679 Cerebrovascular disease, unspecified: Secondary | ICD-10-CM | POA: Diagnosis not present

## 2018-08-12 DIAGNOSIS — I10 Essential (primary) hypertension: Secondary | ICD-10-CM | POA: Diagnosis not present

## 2018-08-12 DIAGNOSIS — E119 Type 2 diabetes mellitus without complications: Secondary | ICD-10-CM | POA: Diagnosis not present

## 2018-08-12 DIAGNOSIS — E7800 Pure hypercholesterolemia, unspecified: Secondary | ICD-10-CM | POA: Diagnosis not present

## 2018-08-18 DIAGNOSIS — E7800 Pure hypercholesterolemia, unspecified: Secondary | ICD-10-CM | POA: Diagnosis not present

## 2018-08-18 DIAGNOSIS — E119 Type 2 diabetes mellitus without complications: Secondary | ICD-10-CM | POA: Diagnosis not present

## 2018-08-18 DIAGNOSIS — I10 Essential (primary) hypertension: Secondary | ICD-10-CM | POA: Diagnosis not present

## 2018-09-01 DIAGNOSIS — S82841A Displaced bimalleolar fracture of right lower leg, initial encounter for closed fracture: Secondary | ICD-10-CM | POA: Diagnosis not present

## 2018-09-21 DIAGNOSIS — E119 Type 2 diabetes mellitus without complications: Secondary | ICD-10-CM | POA: Diagnosis not present

## 2018-09-29 DIAGNOSIS — S82841A Displaced bimalleolar fracture of right lower leg, initial encounter for closed fracture: Secondary | ICD-10-CM | POA: Diagnosis not present

## 2018-10-13 ENCOUNTER — Ambulatory Visit: Payer: BC Managed Care – PPO | Attending: Orthopaedic Surgery | Admitting: Physical Therapy

## 2018-10-13 ENCOUNTER — Other Ambulatory Visit: Payer: Self-pay

## 2018-10-13 DIAGNOSIS — M5441 Lumbago with sciatica, right side: Secondary | ICD-10-CM | POA: Diagnosis not present

## 2018-10-13 DIAGNOSIS — M79671 Pain in right foot: Secondary | ICD-10-CM | POA: Diagnosis not present

## 2018-10-13 NOTE — Therapy (Signed)
[facility] [place], [place], [address] Phone: [phone]   Fax:  [phone]  Physical Therapy Evaluation  Patient Details  Name: Meghan Mcdonald MRN: [id number] Date of Birth: [birth year] Referring Provider (PT): Radene Journey   Encounter Date: 10/13/2018  PT End of Session - 10/13/18 1457    Visit Number  1    Number of Visits  12    Date for PT Re-Evaluation  11/24/18    Authorization Type  FOTO AT LEAST EVERY 5TH VISIT.  PROGRESS NOTE AT 10TH VISIT.    PT Start Time  0210    PT Stop Time  0305    PT Time Calculation (min)  55 min    Activity Tolerance  Patient tolerated treatment well    Behavior During Therapy  [facility] for tasks assessed/performed       Past Medical History:  Diagnosis Date  . Chronic shoulder pain   . Depression   . GERD (gastroesophageal reflux disease)   . History of kidney stones   . Hyperlipemia   . Hypertension   . Insomnia   . Migraine   . Overweight   . Rapid palpitations     Past Surgical History:  Procedure Laterality Date  . BLADDER SURGERY    . CARPAL TUNNEL RELEASE    . CHOLECYSTECTOMY    . ORIF ANKLE FRACTURE Right 07/22/2018   Procedure: OPEN REDUCTION INTERNAL FIXATION (ORIF) RIGHT BIMALL ANKLE FRACTURE;  Surgeon: Erle Crocker, MD;  Location: [facility];  Service: Orthopedics;  Laterality: Right;  . TONSILLECTOMY      There were no vitals filed for this visit.   Subjective Assessment - 10/13/18 1454    Subjective  COVID-19 screen performed prior to patient entering clinic.  The patient presents to the the clinic today  with c/o right sided low back pain with radiation of pain down her right LE to the top of her foot.  Her pain at rest today is a 3/10 but can rise to 8/10 with increased activity.  Medication and rest decrease her pain.    Pertinent History  Cholecystectomy, CTS, HTN, GERD, chronic shoulder pain, right ankle ORIF.    How long can you sit  comfortably?  45 minutes.    How long can you stand comfortably?  30 minutes.    How long can you walk comfortably?  Short community distances.    Patient Stated Goals  get out of pain.    Currently in Pain?  Yes    Pain Score  3     Pain Location  Back    Pain Orientation  Right    Pain Descriptors / Indicators  Aching;Shooting    Pain Type  Acute pain    Pain Onset  More than a month ago    Pain Frequency  Constant    Aggravating Factors   See above.    Pain Relieving Factors  See above.         [facility] PT Assessment - 10/13/18 0001      Assessment   Medical Diagnosis  LBP with radiculopathy.    Referring Provider (PT)  Radene Journey    Onset Date/Surgical Date  --   Mid-May 2020.     Precautions   Precautions  None      Restrictions   Weight Bearing Restrictions  Yes      Balance Screen   Has the patient fallen in the past 6 months  Yes  Has the patient had a decrease in activity level because of a fear of falling?   No    Is the patient reluctant to leave their home because of a fear of falling?   No      Home Environment   Living Environment  Private residence      Prior Function   Level of Independence  Independent      Observation/Other Assessments   Focus on Therapeutic Outcomes (FOTO)   68% limitation.      Posture/Postural Control   Posture/Postural Control  No significant limitations      Deep Tendon Reflexes   DTR Assessment Site  Patella;Achilles    Patella DTR  2+    Achilles DTR  2+      ROM / Strength   AROM / PROM / Strength  AROM;Strength      AROM   Overall AROM Comments  Full active lumbar range of motion.      Strength   Overall Strength Comments  Normal bilateral LE strength.      Palpation   Palpation comment  Very tender to palpation over the right posterior sacral-iliac ligament.      Special Tests   Other special tests  (-) RT SLR; (+) RT FABER test.  Right LE a bit shorter than left due to a left posterior pelvic tilt which  was easily corrected by a reverse muscle energy technique.      Ambulation/Gait   Gait Comments  WNL.                Objective measurements completed on examination: See above findings.      [facility] Adult PT Treatment/Exercise - 10/13/18 0001      Modalities   Modalities  Electrical Stimulation;Moist Heat      Moist Heat Therapy   Number Minutes Moist Heat  20 Minutes    Moist Heat Location  Lumbar Spine      Electrical Stimulation   Electrical Stimulation Location  Right SIJ    Electrical Stimulation Action  Pre-mod     Electrical Stimulation Parameters  80-150 Hz x 20 minutes.    Electrical Stimulation Goals  Pain                  PT Long Term Goals - 10/13/18 1519      PT LONG TERM GOAL #1   Title  Independent with an HEP.    Time  6    Period  Weeks    Status  New      PT LONG TERM GOAL #2   Title  Eliminate right LE symptoms.    Time  6    Period  Weeks    Status  New      PT LONG TERM GOAL #3   Title  Perform ADL's with pain not > 2-3/10.    Time  6    Period  Weeks    Status  New             Plan - 10/13/18 1508    Clinical Impression Statement  The patient presents to OPPT with c/o right sided low back pain with radiation of pain down her right LE to the top of her foot.  She was found to have a slight leg length discrepancy (right shorter than left) due to a right pelvic posterior rotation.  She was very palpably tender over her right posterior sacral iliac ligament.  Her pain has impaired  her functional mobility.  She demonstrated a 68% limitation on her FOTO questionaire. Patient will benefit from skilled physical therapy intervention to address deficits and pain.    Stability/Clinical Decision Making  Stable/Uncomplicated    Clinical Decision Making  Low    Rehab Potential  Excellent    PT Frequency  2x / week    PT Duration  6 weeks    PT Treatment/Interventions  ADLs/Self Care Home Management;Cryotherapy;Electrical  Stimulation;Ultrasound;Traction;Moist Heat;Iontophoresis 4mg /ml Dexamethasone;Therapeutic activities;Therapeutic exercise;Manual techniques;Patient/family education;Dry needling;Joint Manipulations;Spinal Manipulations    PT Next Visit Plan  Combo e'stim/U/S to right SIJ region f/b STW/M; hip adductor squeeze, prone leg lifts, hip bridges, supine resisted hip flexion (reverse muscle energy technique).  Int traction may be an option beginning at 35# body weight.  Iontophoresis over right SIJ per signed cert.       Patient will benefit from skilled therapeutic intervention in order to improve the following deficits and impairments:  Pain, Decreased activity tolerance  Visit Diagnosis: 1. Acute right-sided low back pain with right-sided sciatica        Problem List Patient Active Problem List   Diagnosis Date Noted  . Acute CVA (cerebrovascular accident) ([place]) 12/23/2017  . Severe vertigo 12/22/2017  . Vertigo 12/22/2017  . Dyslipidemia 12/22/2017  . Essential hypertension 12/22/2017  . Nausea & vomiting 12/22/2017    APPLEGATE, [place] MPT 10/13/2018, 3:34 PM  [facility] [place], [place], [address] Phone: [phone]   Fax:  [phone]  Name: Meghan Mcdonald MRN: [id number] Date of Birth: 1955-04-22

## 2018-10-15 ENCOUNTER — Encounter: Payer: Self-pay | Admitting: Physical Therapy

## 2018-10-15 ENCOUNTER — Other Ambulatory Visit: Payer: Self-pay

## 2018-10-15 ENCOUNTER — Ambulatory Visit: Payer: BC Managed Care – PPO | Admitting: Physical Therapy

## 2018-10-15 DIAGNOSIS — M79671 Pain in right foot: Secondary | ICD-10-CM | POA: Diagnosis not present

## 2018-10-15 DIAGNOSIS — M5441 Lumbago with sciatica, right side: Secondary | ICD-10-CM | POA: Diagnosis not present

## 2018-10-15 NOTE — Therapy (Signed)
[facility] [place], [place], [address] Phone: [phone]   Fax:  [phone]  Physical Therapy Treatment  Patient Details  Name: Meghan Mcdonald MRN: [id number] Date of Birth: 1955-10-25 Referring Provider (PT): Radene Journey   Encounter Date: 10/15/2018  PT End of Session - 10/15/18 0717    Visit Number  2    Number of Visits  12    Date for PT Re-Evaluation  11/24/18    Authorization Type  FOTO AT LEAST EVERY 5TH VISIT.  PROGRESS NOTE AT 10TH VISIT.    PT Start Time  [phone]    PT Stop Time  0800    PT Time Calculation (min)  42 min    Activity Tolerance  Patient tolerated treatment well    Behavior During Therapy  WFL for tasks assessed/performed       Past Medical History:  Diagnosis Date  . Chronic shoulder pain   . Depression   . GERD (gastroesophageal reflux disease)   . History of kidney stones   . Hyperlipemia   . Hypertension   . Insomnia   . Migraine   . Overweight   . Rapid palpitations     Past Surgical History:  Procedure Laterality Date  . BLADDER SURGERY    . CARPAL TUNNEL RELEASE    . CHOLECYSTECTOMY    . ORIF ANKLE FRACTURE Right 07/22/2018   Procedure: OPEN REDUCTION INTERNAL FIXATION (ORIF) RIGHT BIMALL ANKLE FRACTURE;  Surgeon: Erle Crocker, MD;  Location: [facility];  Service: Orthopedics;  Laterality: Right;  . TONSILLECTOMY      There were no vitals filed for this visit.  Subjective Assessment - 10/15/18 0717    Subjective  COVID 19 screening performed on patient upon arrival. Reports that she has seen some improvement since eval and sleeping better.    Pertinent History  Cholecystectomy, CTS, HTN, GERD, chronic shoulder pain, right ankle ORIF.    How long can you sit comfortably?  45 minutes.    How long can you stand comfortably?  30 minutes.    How long can you walk comfortably?  Short community distances.    Patient Stated Goals  get out of pain.    Currently  in Pain?  Yes    Pain Score  2     Pain Location  Back    Pain Orientation  Right;Lower    Pain Descriptors / Indicators  Sore;Aching    Pain Type  Acute pain    Pain Radiating Towards  down lateral RLE to below the knee and occasionally the foot    Pain Onset  More than a month ago    Pain Frequency  Constant         [facility] PT Assessment - 10/15/18 0001      Assessment   Medical Diagnosis  LBP with radiculopathy.    Referring Provider (PT)  Radene Journey      Precautions   Precautions  None                   [facility] Adult PT Treatment/Exercise - 10/15/18 0001      Modalities   Modalities  Electrical Stimulation;Moist Heat;Ultrasound      Moist Heat Therapy   Number Minutes Moist Heat  15 Minutes    Moist Heat Location  Lumbar Spine      Electrical Stimulation   Electrical Stimulation Location  Right SIJ    Electrical Stimulation Action  Pre-Mod  Electrical Stimulation Parameters  80-150 hz x15 min    Electrical Stimulation Goals  Pain      Ultrasound   Ultrasound Location  R SIJ    Ultrasound Parameters  Combo 1.5 w/cm2, 100%, 1 mhz x10 min    Ultrasound Goals  Pain      Manual Therapy   Manual Therapy  Soft tissue mobilization    Soft tissue mobilization  STW to R SI joint, lumbar paraspinals, glute to reduce muscle guarding and pain                  PT Long Term Goals - 10/13/18 1519      PT LONG TERM GOAL #1   Title  Independent with an HEP.    Time  6    Period  Weeks    Status  New      PT LONG TERM GOAL #2   Title  Eliminate right LE symptoms.    Time  6    Period  Weeks    Status  New      PT LONG TERM GOAL #3   Title  Perform ADL's with pain not > 2-3/10.    Time  6    Period  Weeks    Status  New            Plan - 10/15/18 0755    Clinical Impression Statement  Patient presented in clinic with reports of lower R SI joint but able to sleep better. Patient reports radiating pain down lateral RLE intermittantly  and still sensitive to palpation of the R SI joint. Normal modalities response noted following removal of the modalities. No complaints of any increased pain during STW session with minimal muscle guarding in glute surrounding the SI joint. Patient educated regarding iontophoresis patch so that when order is signed she will be understanding.    Stability/Clinical Decision Making  Stable/Uncomplicated    Rehab Potential  Excellent    PT Frequency  2x / week    PT Duration  6 weeks    PT Treatment/Interventions  ADLs/Self Care Home Management;Cryotherapy;Electrical Stimulation;Ultrasound;Traction;Moist Heat;Iontophoresis 4mg /ml Dexamethasone;Therapeutic activities;Therapeutic exercise;Manual techniques;Patient/family education;Dry needling;Joint Manipulations;Spinal Manipulations    PT Next Visit Plan  Combo e'stim/U/S to right SIJ region f/b STW/M; hip adductor squeeze, prone leg lifts, hip bridges, supine resisted hip flexion (reverse muscle energy technique).  Int traction may be an option beginning at 35# body weight.  Iontophoresis over right SIJ per signed cert.    Consulted and Agree with Plan of Care  Patient       Patient will benefit from skilled therapeutic intervention in order to improve the following deficits and impairments:  Pain, Decreased activity tolerance  Visit Diagnosis: 1. Acute right-sided low back pain with right-sided sciatica   2. Pain in right foot        Problem List Patient Active Problem List   Diagnosis Date Noted  . Acute CVA (cerebrovascular accident) ([place]) 12/23/2017  . Severe vertigo 12/22/2017  . Vertigo 12/22/2017  . Dyslipidemia 12/22/2017  . Essential hypertension 12/22/2017  . Nausea & vomiting 12/22/2017    Standley Brooking, PTA 10/15/2018, 8:23 AM  [facility] [address] [place], [place], [address] Phone: [phone]   Fax:  [phone]  Name: Meghan Mcdonald MRN: [id number] Date of Birth:  November 04, 1955

## 2018-10-19 ENCOUNTER — Other Ambulatory Visit: Payer: Self-pay

## 2018-10-19 ENCOUNTER — Ambulatory Visit: Payer: BC Managed Care – PPO | Admitting: Physical Therapy

## 2018-10-19 DIAGNOSIS — M79671 Pain in right foot: Secondary | ICD-10-CM | POA: Diagnosis not present

## 2018-10-19 DIAGNOSIS — M5441 Lumbago with sciatica, right side: Secondary | ICD-10-CM | POA: Diagnosis not present

## 2018-10-19 NOTE — Therapy (Signed)
[facility] [place], [place], [address] Phone: [phone]   Fax:  [phone]  Physical Therapy Treatment  Patient Details  Name: Meghan Mcdonald MRN: [id number] Date of Birth: 1956-04-21 Referring Provider (PT): Radene Journey   Encounter Date: 10/19/2018  PT End of Session - 10/19/18 1654    Visit Number  3    Number of Visits  12    Date for PT Re-Evaluation  11/24/18    Authorization Type  FOTO AT LEAST EVERY 5TH VISIT.  PROGRESS NOTE AT 10TH VISIT.    PT Start Time  0155    PT Stop Time  0300    PT Time Calculation (min)  65 min    Activity Tolerance  Patient tolerated treatment well    Behavior During Therapy  WFL for tasks assessed/performed       Past Medical History:  Diagnosis Date  . Chronic shoulder pain   . Depression   . GERD (gastroesophageal reflux disease)   . History of kidney stones   . Hyperlipemia   . Hypertension   . Insomnia   . Migraine   . Overweight   . Rapid palpitations     Past Surgical History:  Procedure Laterality Date  . BLADDER SURGERY    . CARPAL TUNNEL RELEASE    . CHOLECYSTECTOMY    . ORIF ANKLE FRACTURE Right 07/22/2018   Procedure: OPEN REDUCTION INTERNAL FIXATION (ORIF) RIGHT BIMALL ANKLE FRACTURE;  Surgeon: Erle Crocker, MD;  Location: [facility];  Service: Orthopedics;  Laterality: Right;  . TONSILLECTOMY      There were no vitals filed for this visit.  Subjective Assessment - 10/19/18 1655    Subjective  COVID-19 screen performed prior to patient entering clinic.  I hurt more after last treatment but then i started feeling better.    Pertinent History  Cholecystectomy, CTS, HTN, GERD, chronic shoulder pain, right ankle ORIF.    How long can you sit comfortably?  45 minutes.    How long can you stand comfortably?  30 minutes.    How long can you walk comfortably?  Short community distances.    Patient Stated Goals  get out of pain.    Currently  in Pain?  Yes    Pain Score  2     Pain Location  Back    Pain Orientation  Right;Lower    Pain Descriptors / Indicators  Sore;Aching    Pain Onset  More than a month ago                       [facility] Adult PT Treatment/Exercise - 10/19/18 0001      Modalities   Modalities  Electrical Stimulation;Moist Heat      Moist Heat Therapy   Number Minutes Moist Heat  20 Minutes    Moist Heat Location  Lumbar Spine      Electrical Stimulation   Electrical Stimulation Location  Right SIJ    Electrical Stimulation Action  Pre-mod.    Electrical Stimulation Parameters  80-150 Hz x 20 minutes.    Electrical Stimulation Goals  Pain      Ultrasound   Ultrasound Location  Right SIJ    Ultrasound Parameters  Combo e'stim/U/S at 1.50 W/CM2 x 12 minutes.    Ultrasound Goals  Pain      Manual Therapy   Manual Therapy  Soft tissue mobilization    Soft tissue mobilization  STW/M  x 12 minutes to right SIJ and upper gluteal region to reduce tone.                  PT Long Term Goals - 10/13/18 1519      PT LONG TERM GOAL #1   Title  Independent with an HEP.    Time  6    Period  Weeks    Status  New      PT LONG TERM GOAL #2   Title  Eliminate right LE symptoms.    Time  6    Period  Weeks    Status  New      PT LONG TERM GOAL #3   Title  Perform ADL's with pain not > 2-3/10.    Time  6    Period  Weeks    Status  New            Plan - 10/19/18 1701    Clinical Impression Statement  Patient very pleased with her progress thus far noting a significant reduction in her right low back pain-level.  She felt very good after after her treatment today.    Stability/Clinical Decision Making  Stable/Uncomplicated    Rehab Potential  Excellent    PT Frequency  2x / week    PT Duration  6 weeks    PT Treatment/Interventions  ADLs/Self Care Home Management;Cryotherapy;Electrical Stimulation;Ultrasound;Traction;Moist Heat;Iontophoresis 4mg /ml  Dexamethasone;Therapeutic activities;Therapeutic exercise;Manual techniques;Patient/family education;Dry needling;Joint Manipulations;Spinal Manipulations    PT Next Visit Plan  Combo e'stim/U/S to right SIJ region f/b STW/M; hip adductor squeeze, prone leg lifts, hip bridges, supine resisted hip flexion (reverse muscle energy technique).  Int traction may be an option beginning at 35# body weight.  Iontophoresis over right SIJ per signed cert.    Consulted and Agree with Plan of Care  Patient       Patient will benefit from skilled therapeutic intervention in order to improve the following deficits and impairments:  Pain, Decreased activity tolerance  Visit Diagnosis: 1. Acute right-sided low back pain with right-sided sciatica   2. Pain in right foot        Problem List Patient Active Problem List   Diagnosis Date Noted  . Acute CVA (cerebrovascular accident) ([place]) 12/23/2017  . Severe vertigo 12/22/2017  . Vertigo 12/22/2017  . Dyslipidemia 12/22/2017  . Essential hypertension 12/22/2017  . Nausea & vomiting 12/22/2017    Brithney Bensen, [place] MPT 10/19/2018, 5:03 PM  [facility] [place], [place], [address] Phone: [phone]   Fax:  [phone]  Name: Perle Brickhouse MRN: [id number] Date of Birth: 08/15/1955

## 2018-10-19 NOTE — Patient Instructions (Signed)
[facility]  DRY NEEDLING CONSENT FORM   Trigger point dry needling is a physical therapy approach to treat Myofascial Pain and Dysfunction.  Dry Needling (DN) is a valuable and effective way to deactivate myofascial trigger points (muscle knots/pain). It is skilled intervention that uses a thin filiform needle to penetrate the skin and stimulate underlying myofascial trigger points, muscular, and connective tissues for the management of neuromusculoskeletal pain and movement impairments.  A local twitch response (LTR) will be elicited.  This can sometimes feel like a deep ache in the muscle during the procedure. Multiple trigger points in multiple muscles can be treated during each treatment.  No medication of any kind is injected.   As with any medical treatment and procedure, there are possible adverse events.  While significant adverse events are uncommon, they do sometimes occur and must be considered prior to giving consent.  1. Dry needling often causes a "post needling soreness".  There can be an increase in pain from a couple of hours to 2-3 days, followed by an improvement in the overall pain state. 2. Any time a needle is used there is a risk of infection.  However, we are using new, sterile, and disposable needles; infections are extremely rare. 3. There is a possibility that you may bleed or bruise.  You may feel tired and some nausea following treatment. 4. There is a rare possibility of a pneumothorax (air in the chest cavity). 5. Allergic reaction to nickel in the stainless steel needle. 6. If a nerve is touched, it may cause paresthesia (a prickling/shock sensation) which is usually brief, but may continue for a couple of days.  Following treatment stay hydrated.  Continue regular activities but not too vigorous initially after treatment for 24-48 hours.  Dry Needling is best when combined with other physical therapy interventions such as  strengthening, stretching and other therapeutic modalities.   PLEASE ANSWER THE FOLLOWING QUESTIONS:  Do you have a lack of sensation?   Y/N  Do you have a phobia or fear of needles  Y/N  Are you pregnant?    Y/N If yes:  How many weeks? __________ Do you have any implanted devices?  Y/N If yes:  Pacemaker/Spinal Cord Stimulator/Deep Brain Stimulator/Insulin Pump/Other: ________________ Do you have any implants?  Y/N If yes: Breast/Facial/Pecs/Buttocks/Calves/Hip  Replacement/ Knee Replacement/Other: _________ Do you take any blood thinners?   Y/N If yes: Coumadin (Warfarin)/Other: ___________________ Do you have a bleeding disorder?   Y/N If yes: What kind: _________________________________ Do you take any immunosuppressants?  Y/N If yes:   What kind: _________________________________ Do you take anti-inflammatories?   Y/N If yes: What kind: Advil/Aspirin/Other: ________________ Have you ever been diagnosed with Scoliosis? Y/N Have you had back surgery?   Y/N If yes:  Laminectomy/Fusion/Other: ___________________   I have read, or had read to me, the above.  I have had the opportunity to ask any questions.  All of my questions have been answered to my satisfaction and I understand the risks involved with dry needling.  I consent to examination and treatment at [facility], including dry needling, of any and all of my involved and affected muscles.  

## 2018-10-26 ENCOUNTER — Ambulatory Visit: Payer: BC Managed Care – PPO | Attending: Orthopaedic Surgery | Admitting: Physical Therapy

## 2018-10-26 ENCOUNTER — Other Ambulatory Visit: Payer: Self-pay

## 2018-10-26 DIAGNOSIS — M79671 Pain in right foot: Secondary | ICD-10-CM | POA: Diagnosis not present

## 2018-10-26 DIAGNOSIS — M5441 Lumbago with sciatica, right side: Secondary | ICD-10-CM

## 2018-10-26 NOTE — Therapy (Signed)
[facility] [place], [place], [address] Phone: [phone]   Fax:  [phone]  Physical Therapy Treatment  Patient Details  Name: Meghan Mcdonald MRN: [id number] Date of Birth: [birth year] Referring Provider (PT): Radene Journey   Encounter Date: 10/26/2018  PT End of Session - 10/26/18 1414    Visit Number  4    Number of Visits  12    Date for PT Re-Evaluation  11/24/18    Authorization Type  FOTO AT LEAST EVERY 5TH VISIT.  PROGRESS NOTE AT 10TH VISIT.    PT Start Time  0107    PT Stop Time  0159    PT Time Calculation (min)  52 min    Activity Tolerance  Patient tolerated treatment well    Behavior During Therapy  WFL for tasks assessed/performed       Past Medical History:  Diagnosis Date  . Chronic shoulder pain   . Depression   . GERD (gastroesophageal reflux disease)   . History of kidney stones   . Hyperlipemia   . Hypertension   . Insomnia   . Migraine   . Overweight   . Rapid palpitations     Past Surgical History:  Procedure Laterality Date  . BLADDER SURGERY    . CARPAL TUNNEL RELEASE    . CHOLECYSTECTOMY    . ORIF ANKLE FRACTURE Right 07/22/2018   Procedure: OPEN REDUCTION INTERNAL FIXATION (ORIF) RIGHT BIMALL ANKLE FRACTURE;  Surgeon: Erle Crocker, MD;  Location: [facility];  Service: Orthopedics;  Laterality: Right;  . TONSILLECTOMY      There were no vitals filed for this visit.  Subjective Assessment - 10/26/18 1415    Subjective  COVID-19 screen performed prior to patient entering clinic.  Had a flare-up over the weekend but better now.    Pertinent History  Cholecystectomy, CTS, HTN, GERD, chronic shoulder pain, right ankle ORIF.    Patient Stated Goals  get out of pain.    Currently in Pain?  Yes    Pain Score  2     Pain Location  Back    Pain Orientation  Right;Lower    Pain Descriptors / Indicators  Sore;Aching    Pain Onset  More than a month ago                        [facility] Adult PT Treatment/Exercise - 10/26/18 0001      Modalities   Modalities  Electrical Stimulation;Moist Heat;Ultrasound      Moist Heat Therapy   Number Minutes Moist Heat  10 Minutes    Moist Heat Location  Lumbar Spine      Electrical Stimulation   Electrical Stimulation Location  Right SIJ    Electrical Stimulation Action  Pre-mod.    Electrical Stimulation Parameters  80-150 Hz x 10 minutes.    Electrical Stimulation Goals  Pain      Ultrasound   Ultrasound Location  Right SIJ.    Ultrasound Parameters  Combo e'stim/U/S at 1.50 W/CM2 x 12 minutes.    Ultrasound Goals  Pain      Manual Therapy   Manual Therapy  Soft tissue mobilization    Soft tissue mobilization  STW/M x 20 minutes to right SIJ area.             PT Education - 10/26/18 1418    Education Details  Prone leg and chest lifts.  PT Long Term Goals - 10/13/18 1519      PT LONG TERM GOAL #1   Title  Independent with an HEP.    Time  6    Period  Weeks    Status  New      PT LONG TERM GOAL #2   Title  Eliminate right LE symptoms.    Time  6    Period  Weeks    Status  New      PT LONG TERM GOAL #3   Title  Perform ADL's with pain not > 2-3/10.    Time  6    Period  Weeks    Status  New            Plan - 10/26/18 1420    Clinical Impression Statement  Patient had a flare-up over the weekend but is better today.  Progressed her ther ex today without complaint.  She felt good after treatment today.    PT Treatment/Interventions  ADLs/Self Care Home Management;Cryotherapy;Electrical Stimulation;Ultrasound;Traction;Moist Heat;Iontophoresis 4mg /ml Dexamethasone;Therapeutic activities;Therapeutic exercise;Manual techniques;Patient/family education;Dry needling;Joint Manipulations;Spinal Manipulations    PT Next Visit Plan  Combo e'stim/U/S to right SIJ region f/b STW/M; hip adductor squeeze, prone leg lifts, hip bridges, supine resisted hip  flexion (reverse muscle energy technique).  Int traction may be an option beginning at 35# body weight.  Iontophoresis over right SIJ per signed cert.    Consulted and Agree with Plan of Care  Patient       Patient will benefit from skilled therapeutic intervention in order to improve the following deficits and impairments:     Visit Diagnosis: 1. Acute right-sided low back pain with right-sided sciatica   2. Pain in right foot        Problem List Patient Active Problem List   Diagnosis Date Noted  . Acute CVA (cerebrovascular accident) ([place]) 12/23/2017  . Severe vertigo 12/22/2017  . Vertigo 12/22/2017  . Dyslipidemia 12/22/2017  . Essential hypertension 12/22/2017  . Nausea & vomiting 12/22/2017    Tynan Boesel, [place] MPT 10/26/2018, 2:23 PM  [facility] [place], [place], [address] Phone: [phone]   Fax:  [phone]  Name: Jodene Polyak MRN: [id number] Date of Birth: 04/28/1955

## 2018-10-28 ENCOUNTER — Other Ambulatory Visit: Payer: Self-pay

## 2018-10-28 ENCOUNTER — Ambulatory Visit: Payer: BC Managed Care – PPO | Admitting: Physical Therapy

## 2018-10-28 ENCOUNTER — Encounter: Payer: Self-pay | Admitting: Physical Therapy

## 2018-10-28 DIAGNOSIS — M79671 Pain in right foot: Secondary | ICD-10-CM

## 2018-10-28 DIAGNOSIS — M5441 Lumbago with sciatica, right side: Secondary | ICD-10-CM | POA: Diagnosis not present

## 2018-10-28 NOTE — Therapy (Signed)
[facility] [place], [place], [address] Phone: [phone]   Fax:  [phone]  Physical Therapy Treatment  Patient Details  Name: Meghan Mcdonald MRN: [id number] Date of Birth: 11/17/1955 Referring Provider (PT): Radene Journey   Encounter Date: 10/28/2018  PT End of Session - 10/28/18 1338    Visit Number  5    Number of Visits  12    Date for PT Re-Evaluation  11/24/18    Authorization Type  FOTO AT LEAST EVERY 5TH VISIT.  PROGRESS NOTE AT 10TH VISIT.    PT Start Time  1304    PT Stop Time  1356    PT Time Calculation (min)  52 min    Activity Tolerance  Patient tolerated treatment well    Behavior During Therapy  WFL for tasks assessed/performed       Past Medical History:  Diagnosis Date  . Chronic shoulder pain   . Depression   . GERD (gastroesophageal reflux disease)   . History of kidney stones   . Hyperlipemia   . Hypertension   . Insomnia   . Migraine   . Overweight   . Rapid palpitations     Past Surgical History:  Procedure Laterality Date  . BLADDER SURGERY    . CARPAL TUNNEL RELEASE    . CHOLECYSTECTOMY    . ORIF ANKLE FRACTURE Right 07/22/2018   Procedure: OPEN REDUCTION INTERNAL FIXATION (ORIF) RIGHT BIMALL ANKLE FRACTURE;  Surgeon: Erle Crocker, MD;  Location: [facility];  Service: Orthopedics;  Laterality: Right;  . TONSILLECTOMY      There were no vitals filed for this visit.  Subjective Assessment - 10/28/18 1306    Subjective  COVID 19 screening performed on patient upon arrival. Reports soreness at R SI joint. Reports pain running down RLE to top of R foot.    Pertinent History  Cholecystectomy, CTS, HTN, GERD, chronic shoulder pain, right ankle ORIF.    How long can you sit comfortably?  45 minutes.    How long can you stand comfortably?  30 minutes.    How long can you walk comfortably?  Short community distances.    Patient Stated Goals  get out of pain.    Currently in Pain?  Yes    Pain Score  1     Pain Location  Back    Pain Orientation  Right;Lower    Pain Descriptors / Indicators  Sore    Pain Type  Acute pain    Pain Onset  More than a month ago         [facility] PT Assessment - 10/28/18 0001      Assessment   Medical Diagnosis  LBP with radiculopathy.    Referring Provider (PT)  Radene Journey      Precautions   Precautions  None                   [facility] Adult PT Treatment/Exercise - 10/28/18 0001      Modalities   Modalities  Ultrasound;Iontophoresis;Traction      Ultrasound   Ultrasound Location  R SI joint    Ultrasound Parameters  Combo 1.5 w/cm2, 100%, 1 mhz x26m in    Ultrasound Goals  Pain      Iontophoresis   Type of Iontophoresis  Dexamethasone    Location  R SI joint    Dose  1.0 ml    Time  8  Traction   Type of Traction  Lumbar    Min (lbs)  5    Max (lbs)  35    Hold Time  99    Rest Time  5    Time  15      Manual Therapy   Manual Therapy  Soft tissue mobilization    Soft tissue mobilization  STW to R SI joint to reduce pain                  PT Long Term Goals - 10/13/18 1519      PT LONG TERM GOAL #1   Title  Independent with an HEP.    Time  6    Period  Weeks    Status  New      PT LONG TERM GOAL #2   Title  Eliminate right LE symptoms.    Time  6    Period  Weeks    Status  New      PT LONG TERM GOAL #3   Title  Perform ADL's with pain not > 2-3/10.    Time  6    Period  Weeks    Status  New            Plan - 10/28/18 1342    Clinical Impression Statement  Patient presented in clinic with reports of low level R low back soreness and radicular pain down RLE. Normal [place] response followed by STW to R SI joint. New modalities initiated today with mechanical lumbar traction as well as iontophoresis. Patient educated regarding rationale for new modalities and provided a handout regarding iontophoresis guidelines. Patient reported no R foot pain and less  RLE radiating pain following traction session.    Stability/Clinical Decision Making  Stable/Uncomplicated    Rehab Potential  Excellent    PT Frequency  2x / week    PT Duration  6 weeks    PT Treatment/Interventions  ADLs/Self Care Home Management;Cryotherapy;Electrical Stimulation;Ultrasound;Traction;Moist Heat;Iontophoresis 4mg /ml Dexamethasone;Therapeutic activities;Therapeutic exercise;Manual techniques;Patient/family education;Dry needling;Joint Manipulations;Spinal Manipulations    PT Next Visit Plan  Assess response to new modalities.    Consulted and Agree with Plan of Care  Patient       Patient will benefit from skilled therapeutic intervention in order to improve the following deficits and impairments:  Pain, Decreased activity tolerance  Visit Diagnosis: 1. Acute right-sided low back pain with right-sided sciatica   2. Pain in right foot        Problem List Patient Active Problem List   Diagnosis Date Noted  . Acute CVA (cerebrovascular accident) ([place]) 12/23/2017  . Severe vertigo 12/22/2017  . Vertigo 12/22/2017  . Dyslipidemia 12/22/2017  . Essential hypertension 12/22/2017  . Nausea & vomiting 12/22/2017    Standley Brooking, PTA 10/28/2018, 2:00 PM  [facility] [place], [place], [address] Phone: [phone]   Fax:  [phone]  Name: Meghan Mcdonald MRN: [id number] Date of Birth: 02/16/1956

## 2018-11-02 ENCOUNTER — Encounter: Payer: Self-pay | Admitting: Physical Therapy

## 2018-11-02 ENCOUNTER — Ambulatory Visit: Payer: BC Managed Care – PPO | Admitting: Physical Therapy

## 2018-11-02 ENCOUNTER — Other Ambulatory Visit: Payer: Self-pay

## 2018-11-02 DIAGNOSIS — M79671 Pain in right foot: Secondary | ICD-10-CM | POA: Diagnosis not present

## 2018-11-02 DIAGNOSIS — M5441 Lumbago with sciatica, right side: Secondary | ICD-10-CM

## 2018-11-02 NOTE — Therapy (Signed)
[facility] [place], [place], [address] Phone: [phone]   Fax:  [phone]  Physical Therapy Treatment  Patient Details  Name: Meghan Mcdonald MRN: [id number] Date of Birth: 12/28/1955 Referring Provider (PT): Radene Journey   Encounter Date: 11/02/2018  PT End of Session - 11/02/18 1308    Visit Number  6    Number of Visits  12    Date for PT Re-Evaluation  11/24/18    Authorization Type  FOTO AT LEAST EVERY 5TH VISIT.  PROGRESS NOTE AT 10TH VISIT.    PT Start Time  1308    PT Stop Time  1354    PT Time Calculation (min)  46 min    Activity Tolerance  Patient tolerated treatment well    Behavior During Therapy  WFL for tasks assessed/performed       Past Medical History:  Diagnosis Date  . Chronic shoulder pain   . Depression   . GERD (gastroesophageal reflux disease)   . History of kidney stones   . Hyperlipemia   . Hypertension   . Insomnia   . Migraine   . Overweight   . Rapid palpitations     Past Surgical History:  Procedure Laterality Date  . BLADDER SURGERY    . CARPAL TUNNEL RELEASE    . CHOLECYSTECTOMY    . ORIF ANKLE FRACTURE Right 07/22/2018   Procedure: OPEN REDUCTION INTERNAL FIXATION (ORIF) RIGHT BIMALL ANKLE FRACTURE;  Surgeon: Erle Crocker, MD;  Location: [facility];  Service: Orthopedics;  Laterality: Right;  . TONSILLECTOMY      There were no vitals filed for this visit.  Subjective Assessment - 11/02/18 1306    Subjective  COVID 19 screening performed on patient upon arrival. Reports that improvement in radicular symptoms in R foot.    Pertinent History  Cholecystectomy, CTS, HTN, GERD, chronic shoulder pain, right ankle ORIF.    How long can you sit comfortably?  45 minutes.    How long can you stand comfortably?  30 minutes.    How long can you walk comfortably?  Short community distances.    Patient Stated Goals  get out of pain.    Currently in Pain?  Yes     Pain Score  1     Pain Location  Back    Pain Orientation  Right;Lower    Pain Descriptors / Indicators  Sore    Pain Type  Acute pain    Pain Onset  More than a month ago    Pain Frequency  Intermittent         [facility] PT Assessment - 11/02/18 0001      Assessment   Medical Diagnosis  LBP with radiculopathy.    Referring Provider (PT)  Radene Journey      Precautions   Precautions  None                   [facility] Adult PT Treatment/Exercise - 11/02/18 0001      Modalities   Modalities  Traction;Ultrasound      Ultrasound   Ultrasound Location  R SI joint    Ultrasound Parameters  Combo 1.5 w/cm2, 1 00%, 1 mhz x10 min    Ultrasound Goals  Pain      Traction   Type of Traction  Lumbar    Min (lbs)  5    Max (lbs)  50    Hold Time  99  Rest Time  5    Time  15      Manual Therapy   Manual Therapy  Soft tissue mobilization    Soft tissue mobilization  STW to R SI joint to reduce pain                  PT Long Term Goals - 11/02/18 1346      PT LONG TERM GOAL #1   Title  Independent with an HEP.    Time  6    Period  Weeks    Status  Partially Met      PT LONG TERM GOAL #2   Title  Eliminate right LE symptoms.    Time  6    Period  Weeks    Status  Partially Met      PT LONG TERM GOAL #3   Title  Perform ADL's with pain not > 2-3/10.    Time  6    Period  Weeks    Status  On-going            Plan - 11/02/18 1342    Clinical Impression Statement  Patient presented in clinic with reports of less radicular symptoms since traction started. Patient reports reduction of symptoms to approximately 75% after traction session. Patiente experiences intermittant but low level soreness in low back along with tightness in R hip. Less muscle guarding palpable surounding R SI joint. Normal [place] response noted following end of [place] session. Mechanical lumbar traction continued at 50# max today.    Stability/Clinical Decision Making   Stable/Uncomplicated    Rehab Potential  Excellent    PT Frequency  2x / week    PT Duration  6 weeks    PT Treatment/Interventions  ADLs/Self Care Home Management;Cryotherapy;Electrical Stimulation;Ultrasound;Traction;Moist Heat;Iontophoresis 85m/ml Dexamethasone;Therapeutic activities;Therapeutic exercise;Manual techniques;Patient/family education;Dry needling;Joint Manipulations;Spinal Manipulations    PT Next Visit Plan  Continue with traction as symptoms dictate.    Consulted and Agree with Plan of Care  Patient       Patient will benefit from skilled therapeutic intervention in order to improve the following deficits and impairments:  Pain, Decreased activity tolerance  Visit Diagnosis: 1. Acute right-sided low back pain with right-sided sciatica   2. Pain in right foot        Problem List Patient Active Problem List   Diagnosis Date Noted  . [place] 12/23[place]  . Severe vertigo 12/22/2017  . Vertigo 12/22/2017  . Dyslipidemia 12/22/2017  . Essential hypertension 12/22/2017  . Nausea & vomiting 12/22/2017    KStandley Brooking PTA 11/02/2018, 1:57 PM  [facility] [address]Phone: [phone]  Fax:  [phone] Name: Meghan RossmannMRN: [id number] of Birth: [birth year]

## 2018-11-04 ENCOUNTER — Encounter: Payer: Self-pay | Admitting: Physical Therapy

## 2018-11-04 ENCOUNTER — Other Ambulatory Visit: Payer: Self-pay

## 2018-11-04 ENCOUNTER — Ambulatory Visit: Payer: BC Managed Care – PPO | Admitting: Physical Therapy

## 2018-11-04 DIAGNOSIS — M5441 Lumbago with sciatica, right side: Secondary | ICD-10-CM

## 2018-11-04 DIAGNOSIS — M79671 Pain in right foot: Secondary | ICD-10-CM | POA: Diagnosis not present

## 2018-11-04 NOTE — Therapy (Signed)
[facility] [place], [place], [address] Phone: [phone]   Fax:  [phone]  Physical Therapy Treatment  Patient Details  Name: Meghan Mcdonald MRN: [id number] Date of Birth: May 01, 1955 Referring Provider (PT): Radene Journey   Encounter Date: 11/04/2018  PT End of Session - 11/04/18 1521    Visit Number  7    Number of Visits  12    Date for PT Re-Evaluation  11/24/18    Authorization Type  FOTO AT LEAST EVERY 5TH VISIT.  PROGRESS NOTE AT 10TH VISIT.    PT Start Time  1518    PT Stop Time  1610    PT Time Calculation (min)  52 min    Activity Tolerance  Patient tolerated treatment well    Behavior During Therapy  WFL for tasks assessed/performed       Past Medical History:  Diagnosis Date  . Chronic shoulder pain   . Depression   . GERD (gastroesophageal reflux disease)   . History of kidney stones   . Hyperlipemia   . Hypertension   . Insomnia   . Migraine   . Overweight   . Rapid palpitations     Past Surgical History:  Procedure Laterality Date  . BLADDER SURGERY    . CARPAL TUNNEL RELEASE    . CHOLECYSTECTOMY    . ORIF ANKLE FRACTURE Right 07/22/2018   Procedure: OPEN REDUCTION INTERNAL FIXATION (ORIF) RIGHT BIMALL ANKLE FRACTURE;  Surgeon: Erle Crocker, MD;  Location: [facility];  Service: Orthopedics;  Laterality: Right;  . TONSILLECTOMY      There were no vitals filed for this visit.  Subjective Assessment - 11/04/18 1520    Subjective  COVID 19 screening performed on patient upon arrival. Patient reports that she has no radicular symptoms of RLE. Reports some soreness at R SI but usually does the day after PT and uses ice to continue swelling.    Pertinent History  Cholecystectomy, CTS, HTN, GERD, chronic shoulder pain, right ankle ORIF.    How long can you sit comfortably?  45 minutes.    How long can you stand comfortably?  30 minutes.    How long can you walk comfortably?   Short community distances.    Patient Stated Goals  get out of pain.    Currently in Pain?  Yes    Pain Score  1     Pain Location  Back    Pain Orientation  Right;Lower    Pain Descriptors / Indicators  Sore    Pain Type  Acute pain    Pain Onset  More than a month ago    Pain Frequency  Intermittent         [facility] PT Assessment - 11/04/18 0001      Assessment   Medical Diagnosis  LBP with radiculopathy.    Referring Provider (PT)  Radene Journey      Precautions   Precautions  None                   [facility] Adult PT Treatment/Exercise - 11/04/18 0001      Exercises   Exercises  Lumbar      Lumbar Exercises: Aerobic   Nustep  L4 x12 min      Lumbar Exercises: Supine   Clam  20 reps;2 seconds   green theraband   Bent Knee Raise  15 reps    Bridge  15 reps;3 seconds  Other Supine Lumbar Exercises  Ball squeeze x15 reps 5 sec hold      Traction   Type of Traction  Lumbar    Min (lbs)  5    Max (lbs)  60    Hold Time  99    Rest Time  5    Time  15                  PT Long Term Goals - 11/02/18 1346      PT LONG TERM GOAL #1   Title  Independent with an HEP.    Time  6    Period  Weeks    Status  Partially Met      PT LONG TERM GOAL #2   Title  Eliminate right LE symptoms.    Time  6    Period  Weeks    Status  Partially Met      PT LONG TERM GOAL #3   Title  Perform ADL's with pain not > 2-3/10.    Time  6    Period  Weeks    Status  On-going            Plan - 11/04/18 1557    Clinical Impression Statement  Patient presented in clinic with only minimal reports of R SI soreness but no radicular symptoms. Patient does intermittantly report R groin pain as well and educated that if that persists an xray may be needed. Only minimal reports of low back soreness reported with supine therex. Mechanical lumbar traction increased to 60# max today.    Stability/Clinical Decision Making  Stable/Uncomplicated    Rehab Potential   Excellent    PT Frequency  2x / week    PT Duration  6 weeks    PT Treatment/Interventions  ADLs/Self Care Home Management;Cryotherapy;Electrical Stimulation;Ultrasound;Traction;Moist Heat;Iontophoresis '4mg'$ /ml Dexamethasone;Therapeutic activities;Therapeutic exercise;Manual techniques;Patient/family education;Dry needling;Joint Manipulations;Spinal Manipulations    PT Next Visit Plan  Continue with traction as symptoms dictate.    Consulted and Agree with Plan of Care  Patient       Patient will benefit from skilled therapeutic intervention in order to improve the following deficits and impairments:  Pain, Decreased activity tolerance  Visit Diagnosis: 1. Acute right-sided low back pain with right-sided sciatica   2. Pain in right foot        Problem List Patient Active Problem List   Diagnosis Date Noted  . Acute CVA (cerebrovascular accident) ([place]) 12/23/2017  . Severe vertigo 12/22/2017  . Vertigo 12/22/2017  . Dyslipidemia 12/22/2017  . Essential hypertension 12/22/2017  . Nausea & vomiting 12/22/2017    Standley Brooking, PTA 11/04/2018, 4:17 PM  [facility] [place], [place], [address] Phone: [phone]   Fax:  [phone]  Name: Marigene Erler MRN: [id number] Date of Birth: Mar 27, 1956

## 2018-11-09 ENCOUNTER — Ambulatory Visit: Payer: BC Managed Care – PPO | Admitting: Physical Therapy

## 2018-11-09 ENCOUNTER — Other Ambulatory Visit: Payer: Self-pay

## 2018-11-09 DIAGNOSIS — M79671 Pain in right foot: Secondary | ICD-10-CM | POA: Diagnosis not present

## 2018-11-09 DIAGNOSIS — M5441 Lumbago with sciatica, right side: Secondary | ICD-10-CM

## 2018-11-09 NOTE — Therapy (Signed)
[facility] [place], [place], [address] Phone: [phone]   Fax:  [phone]  Physical Therapy Treatment  Patient Details  Name: Meghan Mcdonald MRN: [id number] Date of Birth: [birth year] Referring Provider (PT): Radene Journey   Encounter Date: 11/09/2018  PT End of Session - 11/09/18 1429    Visit Number  8    Number of Visits  12    Date for PT Re-Evaluation  11/24/18    Authorization Type  FOTO AT LEAST EVERY 5TH VISIT.  PROGRESS NOTE AT 10TH VISIT.    PT Start Time  0145    PT Stop Time  0233    PT Time Calculation (min)  48 min    Activity Tolerance  Patient tolerated treatment well    Behavior During Therapy  [facility] for tasks assessed/performed       Past Medical History:  Diagnosis Date  . Chronic shoulder pain   . Depression   . GERD (gastroesophageal reflux disease)   . History of kidney stones   . Hyperlipemia   . Hypertension   . Insomnia   . Migraine   . Overweight   . Rapid palpitations     Past Surgical History:  Procedure Laterality Date  . BLADDER SURGERY    . CARPAL TUNNEL RELEASE    . CHOLECYSTECTOMY    . ORIF ANKLE FRACTURE Right 07/22/2018   Procedure: OPEN REDUCTION INTERNAL FIXATION (ORIF) RIGHT BIMALL ANKLE FRACTURE;  Surgeon: Erle Crocker, MD;  Location: [facility];  Service: Orthopedics;  Laterality: Right;  . TONSILLECTOMY      There were no vitals filed for this visit.  Subjective Assessment - 11/09/18 1400    Subjective  COVID 19 screening performed on patient upon arrival. Patient reported right LE symptoms today.    Pertinent History  Cholecystectomy, CTS, HTN, GERD, chronic shoulder pain, right ankle ORIF.    How long can you sit comfortably?  45 minutes.    How long can you stand comfortably?  30 minutes.    How long can you walk comfortably?  Short community distances.    Patient Stated Goals  get out of pain.    Currently in Pain?  Yes    Pain Score  2      Pain Location  Back    Pain Orientation  Right    Pain Descriptors / Indicators  Aching    Pain Type  Chronic pain    Pain Onset  More than a month ago    Pain Frequency  Intermittent    Aggravating Factors   prolong standing walking    Pain Relieving Factors  at rest                       [facility] Adult PT Treatment/Exercise - 11/09/18 0001      Lumbar Exercises: Aerobic   Nustep  L4 x12 min UE/LE draw in focus      Lumbar Exercises: Supine   Clam  20 reps;2 seconds   green band   Bent Knee Raise  2 seconds;Other (comment)   2x10   Bridge  3 seconds;10 reps    Bridge Limitations  with red t-band    Straight Leg Raise  2 seconds;10 reps    Other Supine Lumbar Exercises  Ball squeeze x20 reps 5 sec hold      Traction   Type of Traction  Lumbar    Min (lbs)  5  Max (lbs)  70    Hold Time  99    Rest Time  5    Time  15                  PT Long Term Goals - 11/09/18 1434      PT LONG TERM GOAL #1   Title  Independent with an HEP.    Time  6    Period  Weeks    Status  Partially Met      PT LONG TERM GOAL #2   Title  Eliminate right LE symptoms.    Time  6    Period  Weeks    Status  Partially Met   80% improved 11/09/18     PT LONG TERM GOAL #3   Title  Perform ADL's with pain not > 2-3/10.    Time  6    Period  Weeks    Status  On-going            Plan - 11/09/18 1435    Clinical Impression Statement  Patient tolerated treatment well today. Patient able to progress with low back exercises today with good form and no increased discomfort. Patient has continued to respond well to mechanical lumbar traction and able to increase today. Patient reported 80% overall improvement, with some ongoing right LE symptoms with prolong standing and walking. Goals ongoing.    Stability/Clinical Decision Making  Stable/Uncomplicated    Rehab Potential  Excellent    PT Frequency  2x / week    PT Duration  6 weeks    PT  Treatment/Interventions  ADLs/Self Care Home Management;Cryotherapy;Electrical Stimulation;Ultrasound;Traction;Moist Heat;Iontophoresis '4mg'$ /ml Dexamethasone;Therapeutic activities;Therapeutic exercise;Manual techniques;Patient/family education;Dry needling;Joint Manipulations;Spinal Manipulations    PT Next Visit Plan  Continue with core progression per tolerance and traction as symptoms dictate.    Consulted and Agree with Plan of Care  Patient       Patient will benefit from skilled therapeutic intervention in order to improve the following deficits and impairments:  Pain, Decreased activity tolerance  Visit Diagnosis: 1. Acute right-sided low back pain with right-sided sciatica   2. Pain in right foot        Problem List Patient Active Problem List   Diagnosis Date Noted  . Acute CVA (cerebrovascular accident) ([place]) 12/23/2017  . Severe vertigo 12/22/2017  . Vertigo 12/22/2017  . Dyslipidemia 12/22/2017  . Essential hypertension 12/22/2017  . Nausea & vomiting 12/22/2017    Phillips Climes, PTA 11/09/2018, 2:47 PM  [facility] [place], [place], [address] Phone: [phone]   Fax:  [phone]  Name: Nicle Connole MRN: [id number] Date of Birth: May 29, 1955

## 2018-11-11 DIAGNOSIS — Z01411 Encounter for gynecological examination (general) (routine) with abnormal findings: Secondary | ICD-10-CM | POA: Diagnosis not present

## 2018-11-12 ENCOUNTER — Ambulatory Visit: Payer: BC Managed Care – PPO | Admitting: Physical Therapy

## 2018-11-12 ENCOUNTER — Other Ambulatory Visit: Payer: Self-pay

## 2018-11-12 ENCOUNTER — Encounter: Payer: Self-pay | Admitting: Physical Therapy

## 2018-11-12 DIAGNOSIS — M5441 Lumbago with sciatica, right side: Secondary | ICD-10-CM | POA: Diagnosis not present

## 2018-11-12 DIAGNOSIS — M79671 Pain in right foot: Secondary | ICD-10-CM | POA: Diagnosis not present

## 2018-11-12 NOTE — Therapy (Signed)
[facility] [place], [place], [address] Phone: [phone]   Fax:  [phone]  Physical Therapy Treatment  Patient Details  Name: Meghan Mcdonald MRN: [id number] Date of Birth: 09/21/1955 Referring Provider (PT): Radene Journey   Encounter Date: 11/12/2018  PT End of Session - 11/12/18 1134    Visit Number  9    Number of Visits  12    Date for PT Re-Evaluation  11/24/18    Authorization Type  FOTO AT LEAST EVERY 5TH VISIT.  PROGRESS NOTE AT 10TH VISIT.    PT Start Time  1118    PT Stop Time  1213    PT Time Calculation (min)  55 min    Activity Tolerance  Patient tolerated treatment well    Behavior During Therapy  WFL for tasks assessed/performed       Past Medical History:  Diagnosis Date  . Chronic shoulder pain   . Depression   . GERD (gastroesophageal reflux disease)   . History of kidney stones   . Hyperlipemia   . Hypertension   . Insomnia   . Migraine   . Overweight   . Rapid palpitations     Past Surgical History:  Procedure Laterality Date  . BLADDER SURGERY    . CARPAL TUNNEL RELEASE    . CHOLECYSTECTOMY    . ORIF ANKLE FRACTURE Right 07/22/2018   Procedure: OPEN REDUCTION INTERNAL FIXATION (ORIF) RIGHT BIMALL ANKLE FRACTURE;  Surgeon: Erle Crocker, MD;  Location: [facility];  Service: Orthopedics;  Laterality: Right;  . TONSILLECTOMY      There were no vitals filed for this visit.  Subjective Assessment - 11/12/18 1206    Subjective  COVID 19 screening performed on patient upon arrival. Patient reported back feels good with some tightness but has more discomfort in right hip/groin area today and attributed it to possible compensation.    Pertinent History  Cholecystectomy, CTS, HTN, GERD, chronic shoulder pain, right ankle ORIF.    How long can you sit comfortably?  45 minutes.    How long can you stand comfortably?  30 minutes.    How long can you walk comfortably?  Short  community distances.    Patient Stated Goals  get out of pain.    Currently in Pain?  Yes    Pain Location  Groin    Pain Orientation  Right    Pain Descriptors / Indicators  Aching;Sore    Pain Type  Chronic pain    Pain Onset  More than a month ago    Pain Frequency  Intermittent         [facility] PT Assessment - 11/12/18 0001      Assessment   Medical Diagnosis  LBP with radiculopathy.    Referring Provider (PT)  Radene Journey      Precautions   Precautions  None                   [facility] Adult PT Treatment/Exercise - 11/12/18 0001      Exercises   Exercises  Lumbar      Lumbar Exercises: Stretches   Hip Flexor Stretch  Right;2 reps;30 seconds    Hip Flexor Stretch Limitations  x1 supine, x1 standing      Lumbar Exercises: Aerobic   Nustep  L4 x12 min UE/LE draw in focus 1029 steps      Lumbar Exercises: Standing   Row  Strengthening;Both;20 reps  Row Limitations  Green XTS    Shoulder Extension  Strengthening;Both;20 reps    Shoulder Extension Limitations  Green XTS    Other Standing Lumbar Exercises  chop/lift 2x10 green XTS bilaterally      Traction   Type of Traction  Lumbar    Min (lbs)  5    Max (lbs)  70    Hold Time  99    Rest Time  5    Time  15             PT Education - 11/12/18 1221    Education Details  Hip flexor in standing and supine    Person(s) Educated  Patient    Methods  Explanation;Demonstration;Handout    Comprehension  Verbalized understanding;Returned demonstration          PT Long Term Goals - 11/09/18 1434      PT LONG TERM GOAL #1   Title  Independent with an HEP.    Time  6    Period  Weeks    Status  Partially Met      PT LONG TERM GOAL #2   Title  Eliminate right LE symptoms.    Time  6    Period  Weeks    Status  Partially Met   80% improved 11/09/18     PT LONG TERM GOAL #3   Title  Perform ADL's with pain not > 2-3/10.    Time  6    Period  Weeks    Status  On-going             Plan - 11/12/18 1135    Clinical Impression Statement  Patient responded well to progression of lumbar stabilization exercises with no reports of increased pain. Patient and PT reviewed hip flexor stretch in supine and standing to which patient prefered supine instead. Patient educated to stretch gently and increase the stretch as symptoms dictate. Patient reported agreement and understanding. Normal response to traction upon removal.    Stability/Clinical Decision Making  Stable/Uncomplicated    Clinical Decision Making  Low    Rehab Potential  Excellent    PT Frequency  2x / week    PT Duration  6 weeks    PT Treatment/Interventions  ADLs/Self Care Home Management;Cryotherapy;Electrical Stimulation;Ultrasound;Traction;Moist Heat;Iontophoresis '4mg'$ /ml Dexamethasone;Therapeutic activities;Therapeutic exercise;Manual techniques;Patient/family education;Dry needling;Joint Manipulations;Spinal Manipulations    PT Next Visit Plan  Continue with core progression per tolerance and traction as symptoms dictate.    Consulted and Agree with Plan of Care  Patient       Patient will benefit from skilled therapeutic intervention in order to improve the following deficits and impairments:  Pain, Decreased activity tolerance  Visit Diagnosis: 1. Acute right-sided low back pain with right-sided sciatica        Problem List Patient Active Problem List   Diagnosis Date Noted  . Acute CVA (cerebrovascular accident) ([place]) 12/23/2017  . Severe vertigo 12/22/2017  . Vertigo 12/22/2017  . Dyslipidemia 12/22/2017  . Essential hypertension 12/22/2017  . Nausea & vomiting 12/22/2017    Gabriela Eves, PT, DPT 11/12/2018, 12:23 PM  [facility] [address] [place], [place], [address] Phone: [phone]   Fax:  [phone]  Name: Carolyna Yerian MRN: [id number] Date of Birth: 06-16-1955

## 2018-11-16 ENCOUNTER — Other Ambulatory Visit: Payer: Self-pay

## 2018-11-16 ENCOUNTER — Encounter: Payer: Self-pay | Admitting: Physical Therapy

## 2018-11-16 ENCOUNTER — Ambulatory Visit: Payer: BC Managed Care – PPO | Admitting: Physical Therapy

## 2018-11-16 DIAGNOSIS — M5441 Lumbago with sciatica, right side: Secondary | ICD-10-CM

## 2018-11-16 DIAGNOSIS — M79671 Pain in right foot: Secondary | ICD-10-CM | POA: Diagnosis not present

## 2018-11-16 NOTE — Therapy (Signed)
[facility] [place], [place], [address] Phone: [phone]   Fax:  [phone]  Physical Therapy Treatment  Patient Details  Name: Meghan Mcdonald MRN: [id number] Date of Birth: 1955/08/08 Referring Provider (PT): Radene Journey   Encounter Date: 11/16/2018  PT End of Session - 11/16/18 1420    Visit Number  10    Number of Visits  12    Date for PT Re-Evaluation  11/24/18    Authorization Type  FOTO AT LEAST EVERY 5TH VISIT.  PROGRESS NOTE AT 10TH VISIT.    PT Start Time  [phone]    PT Stop Time  0231    PT Time Calculation (min)  45 min    Activity Tolerance  Patient tolerated treatment well    Behavior During Therapy  [facility] for tasks assessed/performed       Past Medical History:  Diagnosis Date  . Chronic shoulder pain   . Depression   . GERD (gastroesophageal reflux disease)   . History of kidney stones   . Hyperlipemia   . Hypertension   . Insomnia   . Migraine   . Overweight   . Rapid palpitations     Past Surgical History:  Procedure Laterality Date  . BLADDER SURGERY    . CARPAL TUNNEL RELEASE    . CHOLECYSTECTOMY    . ORIF ANKLE FRACTURE Right 07/22/2018   Procedure: OPEN REDUCTION INTERNAL FIXATION (ORIF) RIGHT BIMALL ANKLE FRACTURE;  Surgeon: Erle Crocker, MD;  Location: [place];  Service: Orthopedics;  Laterality: Right;  . TONSILLECTOMY      There were no vitals filed for this visit.  Subjective Assessment - 11/16/18 1357    Subjective  COVID 19 screening performed on patient upon arrival. Patient reported some pain due to prolong driving all week.    Pertinent History  Cholecystectomy, CTS, HTN, GERD, chronic shoulder pain, right ankle ORIF.    How long can you sit comfortably?  45 minutes.    How long can you stand comfortably?  30 minutes.    How long can you walk comfortably?  Short community distances.    Patient Stated Goals  get out of pain.    Currently in Pain?  Yes     Pain Score  1     Pain Location  Hip    Pain Orientation  Right    Pain Descriptors / Indicators  Aching    Pain Type  Chronic pain    Pain Onset  More than a month ago    Pain Frequency  Intermittent    Aggravating Factors   prolong sitting or walking    Pain Relieving Factors  at rest                       [facility] Adult PT Treatment/Exercise - 11/16/18 0001      Lumbar Exercises: Stretches   Hip Flexor Stretch  Right;2 reps;30 seconds    Hip Flexor Stretch Limitations  standing      Lumbar Exercises: Aerobic   Nustep  L4 x12 min UE/LE draw in focus 1035 steps      Lumbar Exercises: Standing   Row  Strengthening;Both;20 reps    Row Limitations  green xts    Shoulder Extension  Strengthening;Both;20 reps    Shoulder Extension Limitations  greem xts    Other Standing Lumbar Exercises  chop/lift 2x10 green XTS bilaterally      Traction  Type of Traction  Lumbar    Min (lbs)  5    Max (lbs)  70    Hold Time  99    Rest Time  5    Time  15                  PT Long Term Goals - 11/16/18 1439      PT LONG TERM GOAL #1   Title  Independent with an HEP.    Baseline  met 11/16/18    Time  6    Period  Weeks    Status  Achieved   11/16/18     PT LONG TERM GOAL #2   Title  Eliminate right LE symptoms.    Time  6    Period  Weeks    Status  Partially Met   90% improved 11/16/18     PT LONG TERM GOAL #3   Title  Perform ADL's with pain not > 2-3/10.    Time  6    Period  Weeks    Status  On-going   1/10 yet at times up to 9/75 with certain activty 8/83/25           Plan - 11/16/18 1427    Clinical Impression Statement  Patient tolerated treatment well today. Patient continues to progress and respond well to core exercises and traction. Patient reported minimal pain today and feels like therapy is helping. LTG #1 met and other goals ongoing.    Stability/Clinical Decision Making  Stable/Uncomplicated    Rehab Potential  Excellent     PT Frequency  2x / week    PT Duration  6 weeks    PT Treatment/Interventions  ADLs/Self Care Home Management;Cryotherapy;Electrical Stimulation;Ultrasound;Traction;Moist Heat;Iontophoresis '4mg'$ /ml Dexamethasone;Therapeutic activities;Therapeutic exercise;Manual techniques;Patient/family education;Dry needling;Joint Manipulations;Spinal Manipulations    PT Next Visit Plan  Continue with core progression per tolerance and traction as symptoms dictate.    Consulted and Agree with Plan of Care  Patient       Patient will benefit from skilled therapeutic intervention in order to improve the following deficits and impairments:  Pain, Decreased activity tolerance  Visit Diagnosis: 1. Acute right-sided low back pain with right-sided sciatica        Problem List Patient Active Problem List   Diagnosis Date Noted  . Acute CVA (cerebrovascular accident) ([place]) 12/23/2017  . Severe vertigo 12/22/2017  . Vertigo 12/22/2017  . Dyslipidemia 12/22/2017  . Essential hypertension 12/22/2017  . Nausea & vomiting 12/22/2017   Ladean Raya, PTA 11/16/18 2:43 PM  [facility] [place], [place], [address] Phone: [phone]   Fax:  [phone]  Name: Daniya Aramburo MRN: [id number] Date of Birth: 06-16-1955  Progress Note Reporting Period 10/13/18 to 11/16/18  See note below for Objective Data and Assessment of Progress/Goals.   Patient is making excellent progress and is expected to meet all goals.  Her FOTO score has improved significantly.    Mali Applegate MPT

## 2018-11-18 ENCOUNTER — Ambulatory Visit: Payer: BC Managed Care – PPO | Admitting: Physical Therapy

## 2018-11-23 ENCOUNTER — Encounter: Payer: Self-pay | Admitting: Physical Therapy

## 2018-11-23 ENCOUNTER — Ambulatory Visit: Payer: BC Managed Care – PPO | Attending: Orthopaedic Surgery | Admitting: Physical Therapy

## 2018-11-23 ENCOUNTER — Other Ambulatory Visit: Payer: Self-pay

## 2018-11-23 DIAGNOSIS — M79671 Pain in right foot: Secondary | ICD-10-CM | POA: Diagnosis not present

## 2018-11-23 DIAGNOSIS — M5441 Lumbago with sciatica, right side: Secondary | ICD-10-CM | POA: Diagnosis not present

## 2018-11-23 NOTE — Therapy (Addendum)
[facility] [place], [place], [address] Phone: [phone]   Fax:  [phone]  Physical Therapy Treatment  Patient Details  Name: Meghan Mcdonald MRN: [id number] Date of Birth: 10-10-1955 Referring Provider (PT): Radene Journey   Encounter Date: 11/23/2018  PT End of Session - 11/23/18 1140    Visit Number  11    Number of Visits  12    Date for PT Re-Evaluation  11/24/18    Authorization Type  FOTO AT LEAST EVERY 5TH VISIT.  PROGRESS NOTE AT 10TH VISIT.    PT Start Time  1037    PT Stop Time  1131    PT Time Calculation (min)  54 min    Activity Tolerance  Patient tolerated treatment well    Behavior During Therapy  WFL for tasks assessed/performed       Past Medical History:  Diagnosis Date  . Chronic shoulder pain   . Depression   . GERD (gastroesophageal reflux disease)   . History of kidney stones   . Hyperlipemia   . Hypertension   . Insomnia   . Migraine   . Overweight   . Rapid palpitations     Past Surgical History:  Procedure Laterality Date  . BLADDER SURGERY    . CARPAL TUNNEL RELEASE    . CHOLECYSTECTOMY    . ORIF ANKLE FRACTURE Right 07/22/2018   Procedure: OPEN REDUCTION INTERNAL FIXATION (ORIF) RIGHT BIMALL ANKLE FRACTURE;  Surgeon: Erle Crocker, MD;  Location: [place];  Service: Orthopedics;  Laterality: Right;  . TONSILLECTOMY      There were no vitals filed for this visit.  Subjective Assessment - 11/23/18 1137    Subjective  COVID-19 screen performed prior to patient entering clinic.  I'm much better.    Pertinent History  Cholecystectomy, CTS, HTN, GERD, chronic shoulder pain, right ankle ORIF.    How long can you sit comfortably?  45 minutes.    How long can you stand comfortably?  30 minutes.    How long can you walk comfortably?  Short community distances.    Patient Stated Goals  get out of pain.    Currently in Pain?  Yes    Pain Location  Hip    Pain  Orientation  Right    Pain Descriptors / Indicators  Aching    Pain Type  Chronic pain    Pain Onset  More than a month ago                       [facility] Adult PT Treatment/Exercise - 11/23/18 0001      Exercises   Exercises  Knee/Hip      Lumbar Exercises: Aerobic   Recumbent Bike  Level 3 x 15 minutes.      Traction   Type of Traction  Lumbar    Min (lbs)  5    Max (lbs)  70    Hold Time  99    Rest Time  5    Time  15      Manual Therapy   Soft tissue mobilization  In prone:  STW/M x 8 minutes to right low back.                  PT Long Term Goals - 11/16/18 1439      PT LONG TERM GOAL #1   Title  Independent with an HEP.    Baseline  met  11/16/18    Time  6    Period  Weeks    Status  Achieved   11/16/18     PT LONG TERM GOAL #2   Title  Eliminate right LE symptoms.    Time  6    Period  Weeks    Status  Partially Met   90% improved 11/16/18     PT LONG TERM GOAL #3   Title  Perform ADL's with pain not > 2-3/10.    Time  6    Period  Weeks    Status  On-going   1/10 yet at times up to 5/84 with certain activty 08/06/10           Plan - 11/23/18 1142    Clinical Impression Statement  Excellent progress.  Minimal palpable pain over right SIJ today.    Stability/Clinical Decision Making  Stable/Uncomplicated    Rehab Potential  Excellent    PT Frequency  2x / week    PT Duration  6 weeks    PT Treatment/Interventions  ADLs/Self Care Home Management;Cryotherapy;Electrical Stimulation;Ultrasound;Traction;Moist Heat;Iontophoresis '4mg'$ /ml Dexamethasone;Therapeutic activities;Therapeutic exercise;Manual techniques;Patient/family education;Dry needling;Joint Manipulations;Spinal Manipulations    PT Next Visit Plan  Continue with core progression per tolerance and traction as symptoms dictate.    Consulted and Agree with Plan of Care  Patient       Patient will benefit from skilled therapeutic intervention in order to improve the  following deficits and impairments:  Pain, Decreased activity tolerance  Visit Diagnosis: 1. Acute right-sided low back pain with right-sided sciatica   2. Pain in right foot        Problem List Patient Active Problem List   Diagnosis Date Noted  . Acute CVA (cerebrovascular accident) ([place]) 12/23/2017  . Severe vertigo 12/22/2017  . Vertigo 12/22/2017  . Dyslipidemia 12/22/2017  . Essential hypertension 12/22/2017  . Nausea & vomiting 12/22/2017    Garek Schuneman, [place] MPT 11/23/2018, 11:44 AM  [facility] [address] [place], [place], [address] Phone: [phone]   Fax:  [phone]  Name: Guillermo Difrancesco MRN: [id number] Date of Birth: 01-03-1956  PHYSICAL THERAPY DISCHARGE SUMMARY  Visits from Start of Care: 11.  Current functional level related to goals / functional outcomes: See above.   Remaining deficits: See goal section.   Education / Equipment: HEP. Plan: Patient agrees to discharge.  Patient goals were partially met. Patient is being discharged due to being pleased with the current functional level.  ?????         Mali Kikue Gerhart MPT

## 2018-12-01 DIAGNOSIS — S82841A Displaced bimalleolar fracture of right lower leg, initial encounter for closed fracture: Secondary | ICD-10-CM | POA: Diagnosis not present

## 2018-12-16 IMAGING — US US ABDOMEN LIMITED
1 series · 14 of 25 positions shown · non-contrast
Comparison: None.

CLINICAL DATA: Elevated LFTs.

EXAM:
ULTRASOUND ABDOMEN LIMITED RIGHT UPPER QUADRANT

[Series 1: us abdomen limited · 0.24mm/px · 14 of 41 slices shown]
[im 1/41]
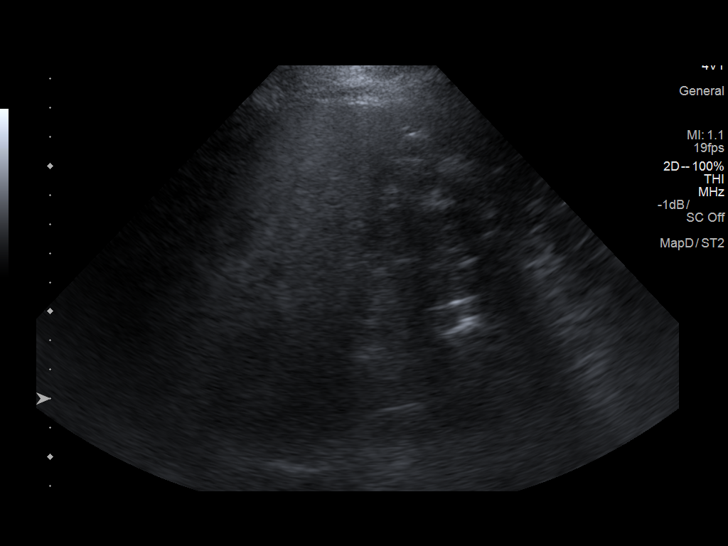
[im 4/41]
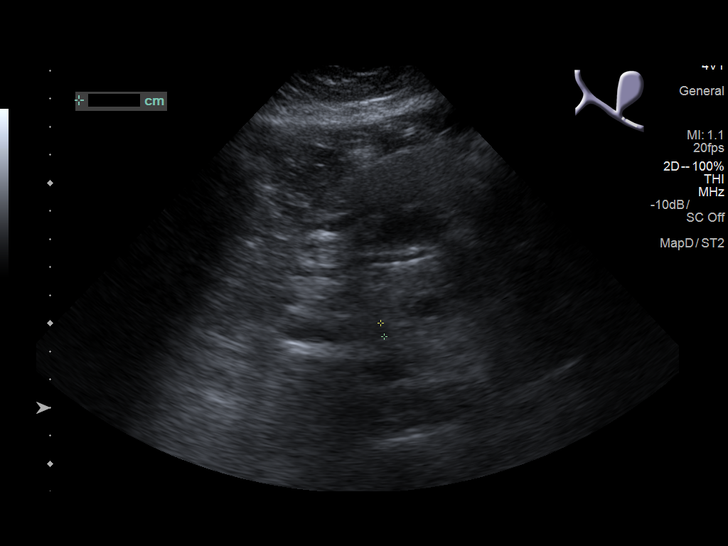
[im 7/41]
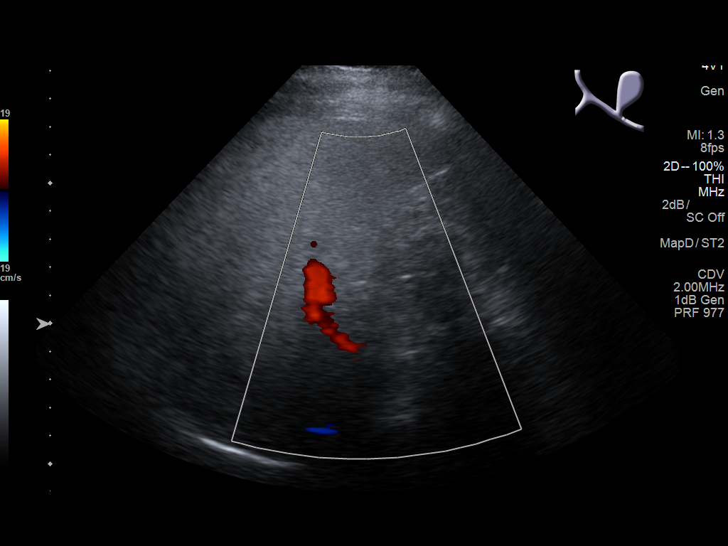
[im 11/41]
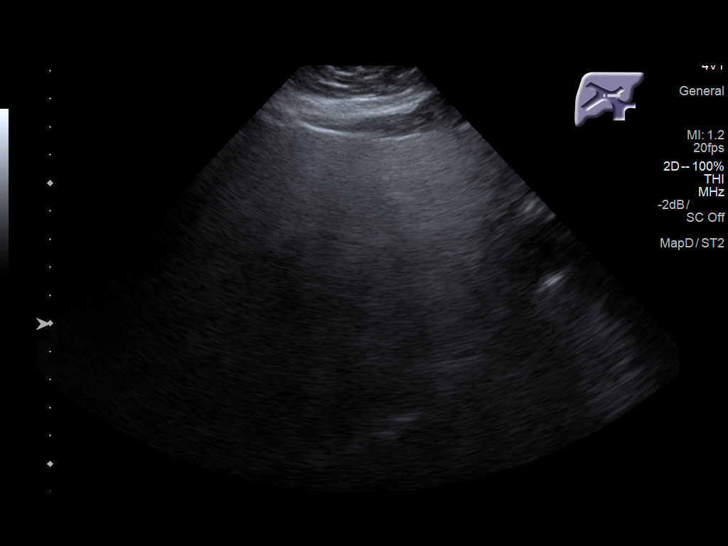
[im 14/41]
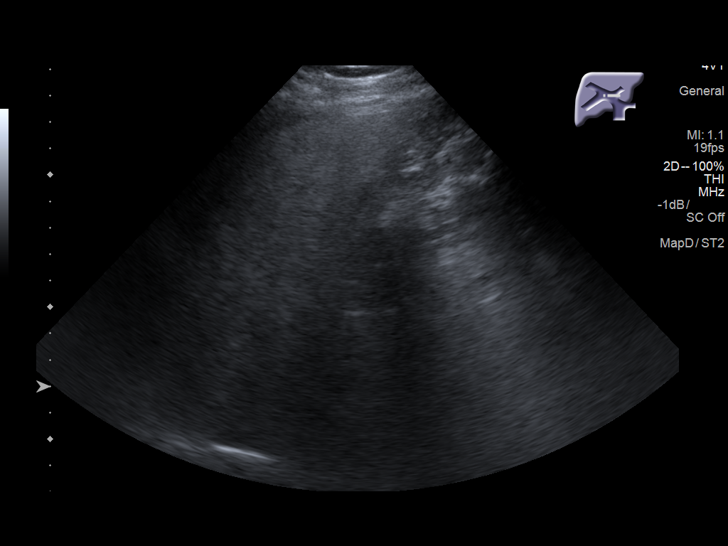
[im 16/41]
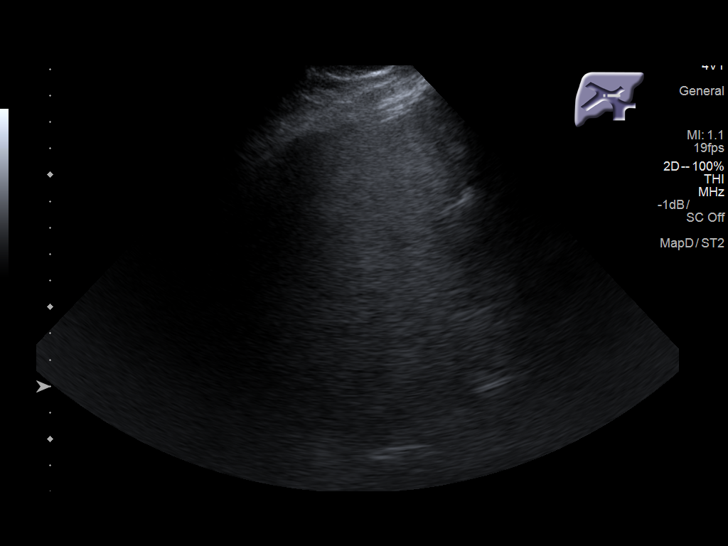
[im 19/41]
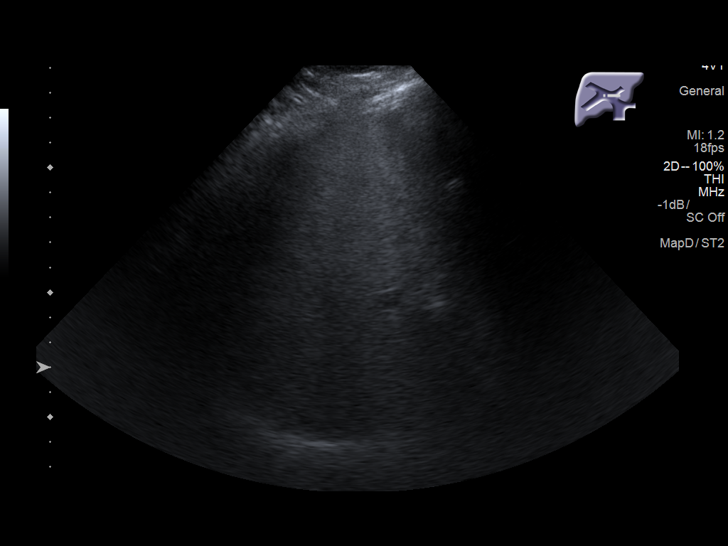
[im 22/41]
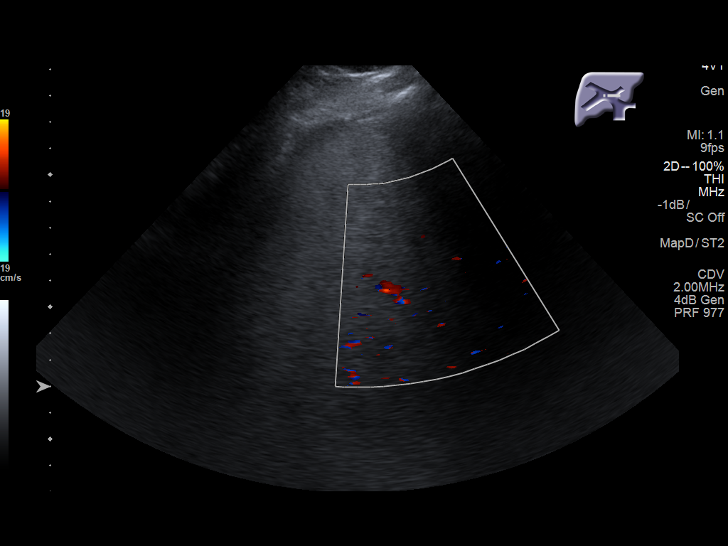
[im 26/41]
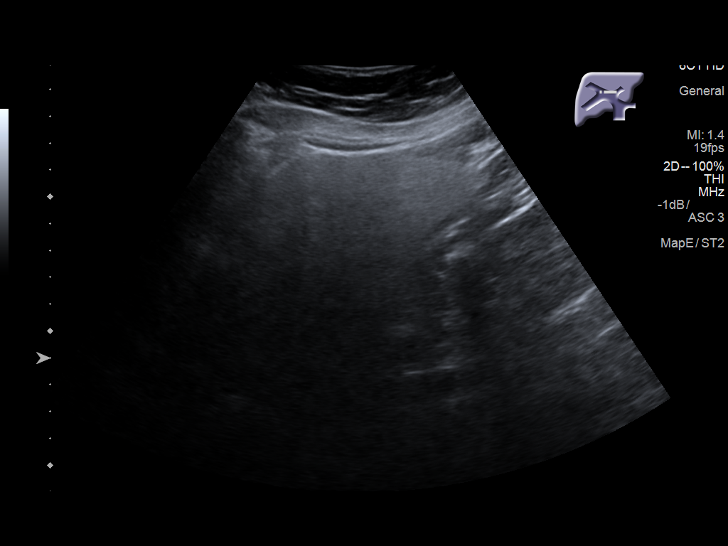
[im 27/41]
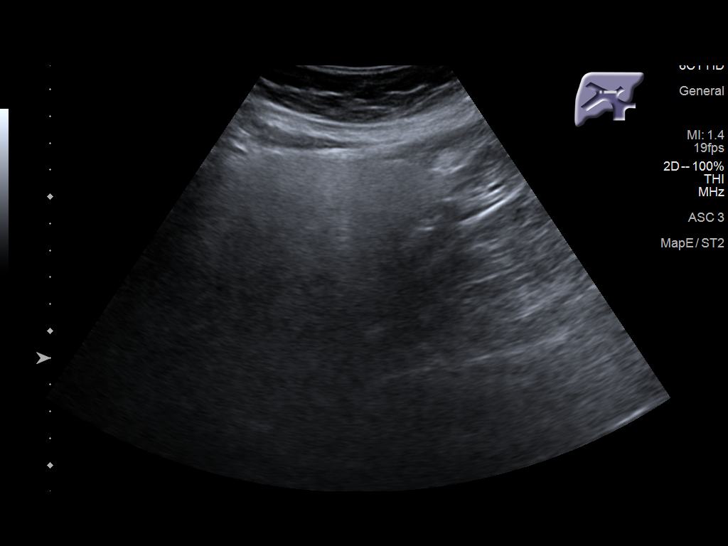
[im 31/41]
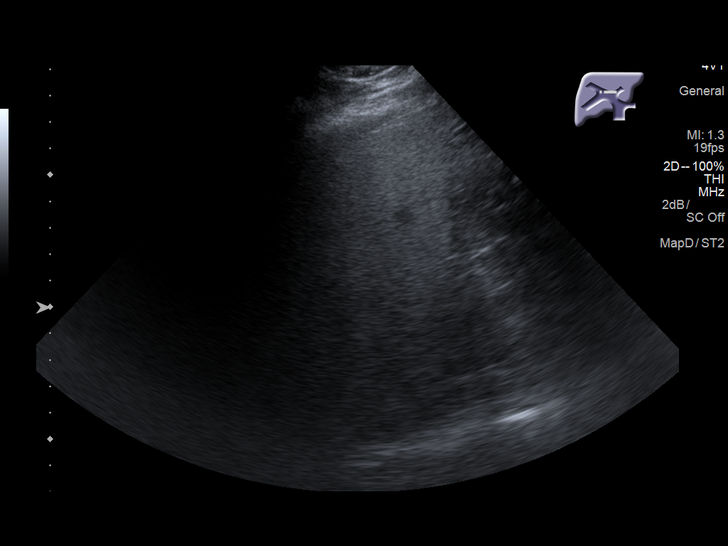
[im 34/41]
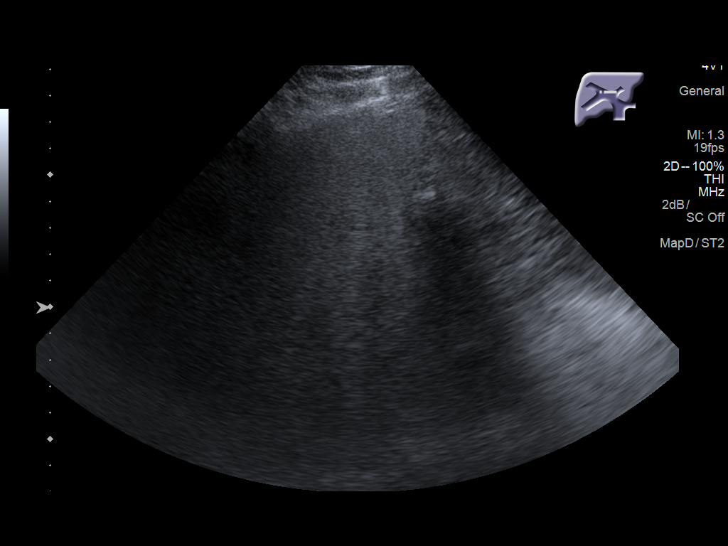
[im 37/41]
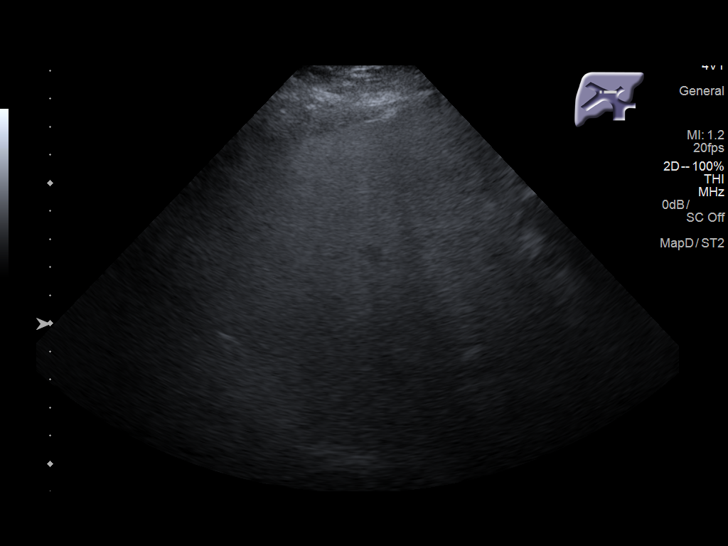
[im 41/41]
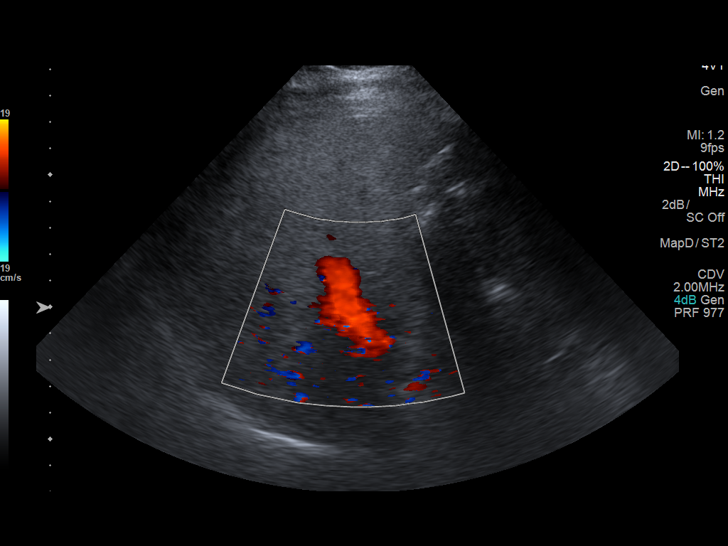

[14 of 25 positions shown; findings below may reference images not displayed]

FINDINGS: Gallbladder:

Surgically absent.

Common bile duct:

Diameter: 4.2 mm

Liver:

Probable hepatic steatosis. Portal vein is patent on color Doppler
imaging with normal direction of blood flow towards the liver.
IMPRESSION: Probable hepatic steatosis.  Previous cholecystectomy.

## 2019-02-08 DIAGNOSIS — L570 Actinic keratosis: Secondary | ICD-10-CM | POA: Diagnosis not present

## 2019-02-08 DIAGNOSIS — D485 Neoplasm of uncertain behavior of skin: Secondary | ICD-10-CM | POA: Diagnosis not present

## 2019-02-08 DIAGNOSIS — L821 Other seborrheic keratosis: Secondary | ICD-10-CM | POA: Diagnosis not present

## 2019-02-08 DIAGNOSIS — D1801 Hemangioma of skin and subcutaneous tissue: Secondary | ICD-10-CM | POA: Diagnosis not present

## 2019-02-08 DIAGNOSIS — L578 Other skin changes due to chronic exposure to nonionizing radiation: Secondary | ICD-10-CM | POA: Diagnosis not present

## 2019-02-08 DIAGNOSIS — L905 Scar conditions and fibrosis of skin: Secondary | ICD-10-CM | POA: Diagnosis not present

## 2019-02-17 DIAGNOSIS — Z1231 Encounter for screening mammogram for malignant neoplasm of breast: Secondary | ICD-10-CM | POA: Diagnosis not present

## 2019-03-14 DIAGNOSIS — E782 Mixed hyperlipidemia: Secondary | ICD-10-CM | POA: Diagnosis not present

## 2019-03-14 DIAGNOSIS — E119 Type 2 diabetes mellitus without complications: Secondary | ICD-10-CM | POA: Diagnosis not present

## 2019-03-14 DIAGNOSIS — I10 Essential (primary) hypertension: Secondary | ICD-10-CM | POA: Diagnosis not present

## 2019-03-14 DIAGNOSIS — F419 Anxiety disorder, unspecified: Secondary | ICD-10-CM | POA: Diagnosis not present

## 2019-04-21 DIAGNOSIS — D485 Neoplasm of uncertain behavior of skin: Secondary | ICD-10-CM | POA: Diagnosis not present

## 2019-05-05 DIAGNOSIS — Z23 Encounter for immunization: Secondary | ICD-10-CM | POA: Diagnosis not present

## 2019-06-17 DIAGNOSIS — S82841D Displaced bimalleolar fracture of right lower leg, subsequent encounter for closed fracture with routine healing: Secondary | ICD-10-CM | POA: Diagnosis not present

## 2019-08-30 DIAGNOSIS — E1169 Type 2 diabetes mellitus with other specified complication: Secondary | ICD-10-CM | POA: Diagnosis not present

## 2019-08-30 DIAGNOSIS — G43909 Migraine, unspecified, not intractable, without status migrainosus: Secondary | ICD-10-CM | POA: Diagnosis not present

## 2019-08-30 DIAGNOSIS — Z0000 Encounter for general adult medical examination without abnormal findings: Secondary | ICD-10-CM | POA: Diagnosis not present

## 2019-08-30 DIAGNOSIS — I10 Essential (primary) hypertension: Secondary | ICD-10-CM | POA: Diagnosis not present

## 2019-08-30 DIAGNOSIS — E782 Mixed hyperlipidemia: Secondary | ICD-10-CM | POA: Diagnosis not present

## 2019-08-30 DIAGNOSIS — Z23 Encounter for immunization: Secondary | ICD-10-CM | POA: Diagnosis not present

## 2019-12-04 IMAGING — MR MR HEAD W/O CM
10 of 11 series · 42 of 48 positions shown · non-contrast
Comparison: 12/22/2017 CT head

CLINICAL DATA: 61 y/o F; 2-3 days of dizziness. Intermittent
vertigo in the past month. Associated nausea and vomiting.

EXAM:
MRI HEAD WITHOUT CONTRAST
TECHNIQUE: Multiplanar, multiecho pulse sequences of the brain and surrounding
structures were obtained without intravenous contrast.

[Series 5: DWI · axial · 4.0mm · 0.92mm/px · z∈[-124,+14]mm · 7 of 72 slices shown (1 of 4)]
[im 1/72]
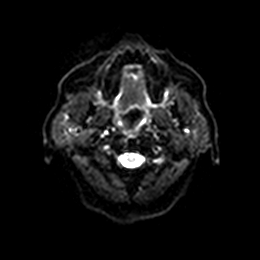
[im 12/72]
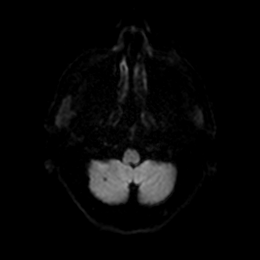
[im 24/72]
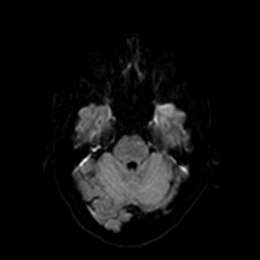
[im 36/72]
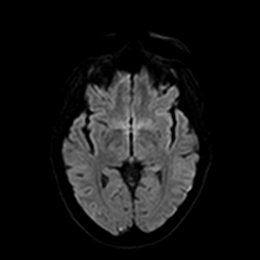
[im 48/72]
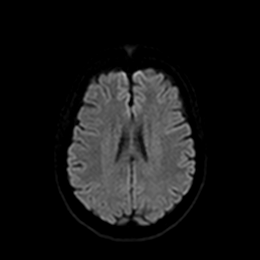
[im 60/72]
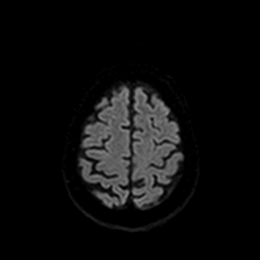
[im 72/72]
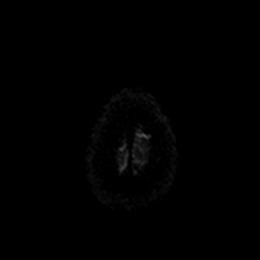

[Series 6: DWI · axial · 4.0mm · 0.92mm/px · z∈[-124,+14]mm · 3 of 36 slices shown (2 of 4)]
[im 1/36]
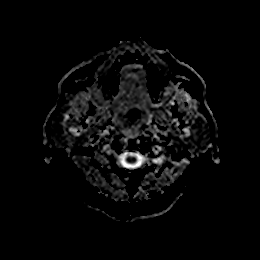
[im 18/36]
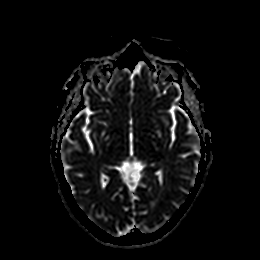
[im 36/36]
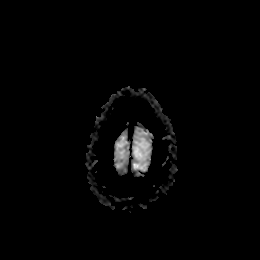

[Series 7: T2 · axial · 5.0mm · 0.75mm/px · z∈[-126,+16]mm · 3 of 25 slices shown (1 of 2)]
[im 1/25]
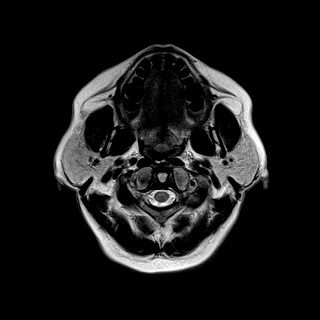
[im 13/25]
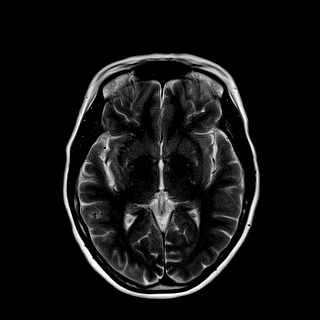
[im 25/25]
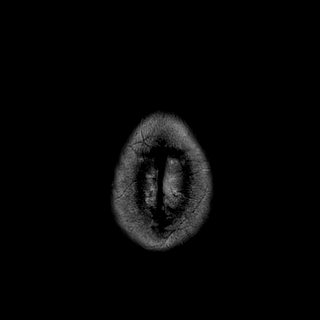

[Series 8: FLAIR · axial · 5.0mm · 0.47mm/px · z∈[-124,+18]mm · 3 of 25 slices shown]
[im 1/25]
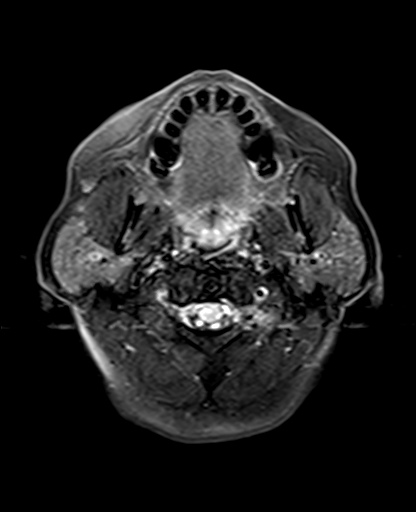
[im 13/25]
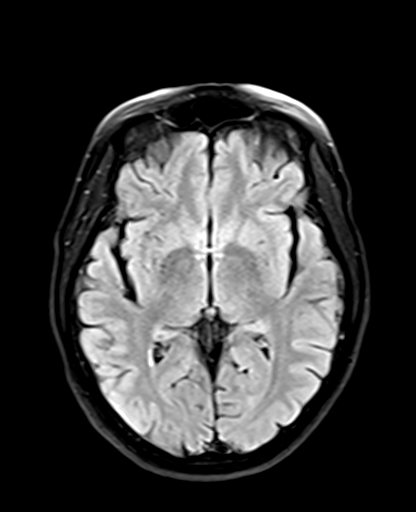
[im 25/25]
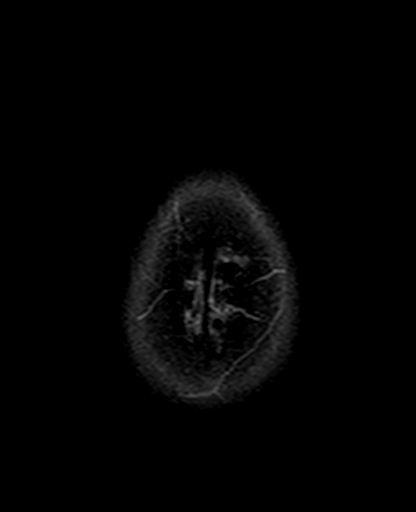

[Series 9: swi_images · axial · 3.0mm · 0.94mm/px · z∈[-128,+23]mm · 6 of 52 slices shown]
[im 1/52]
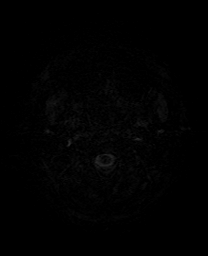
[im 11/52]
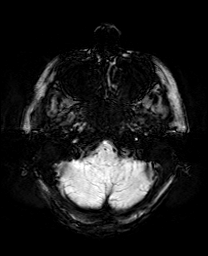
[im 21/52]
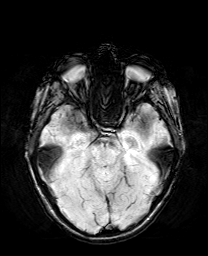
[im 31/52]
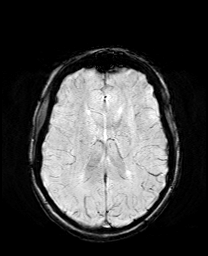
[im 41/52]
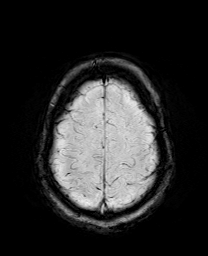
[im 52/52]
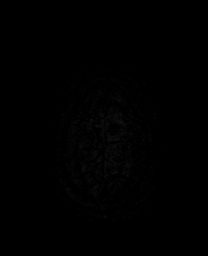

[Series 10: mip_images(sw) · axial · 24.0mm · 0.94mm/px · z∈[-117,+13]mm · 5 of 45 slices shown]
[im 1/45]
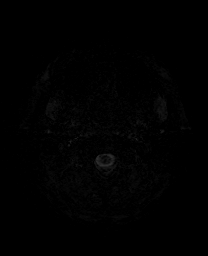
[im 12/45]
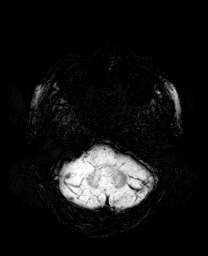
[im 23/45]
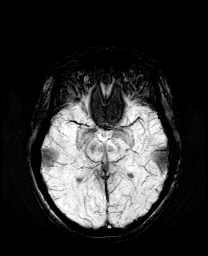
[im 34/45]
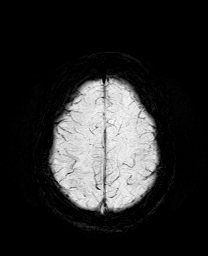
[im 45/45]
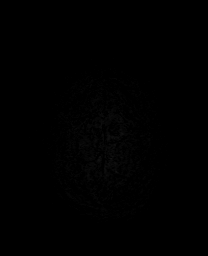

[Series 11: DWI · coronal · 4.0mm · 0.88mm/px · 7 of 64 slices shown (3 of 4)]
[im 1/64]
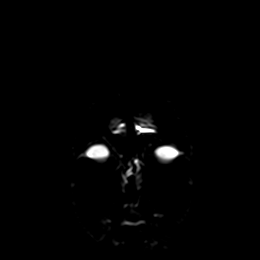
[im 11/64]
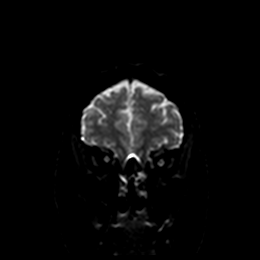
[im 22/64]
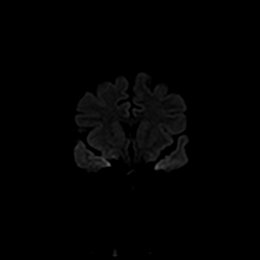
[im 32/64]
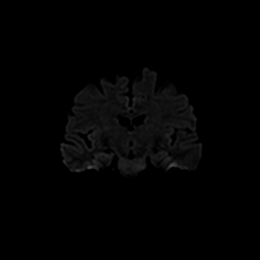
[im 43/64]
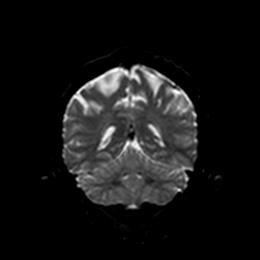
[im 53/64]
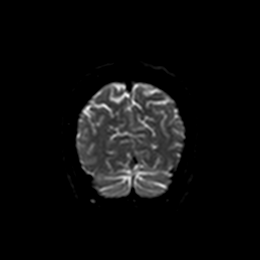
[im 64/64]
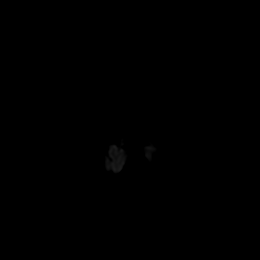

[Series 12: DWI · coronal · 4.0mm · 0.88mm/px · 3 of 32 slices shown (4 of 4)]
[im 1/32]
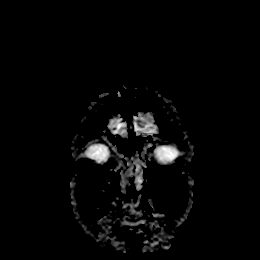
[im 16/32]
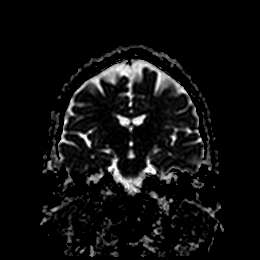
[im 32/32]
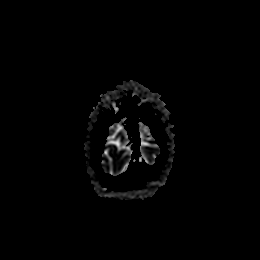

[Series 13: T1 · sagittal · 5.0mm · 0.75mm/px · 2 of 23 slices shown]
[im 1/23]
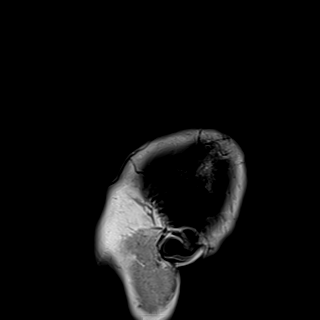
[im 23/23]
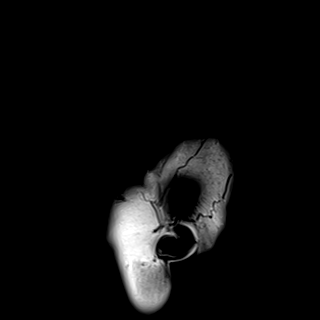

[Series 15: T2 · coronal · 5.0mm · 0.34mm/px · 3 of 26 slices shown (2 of 2)]
[im 1/26]
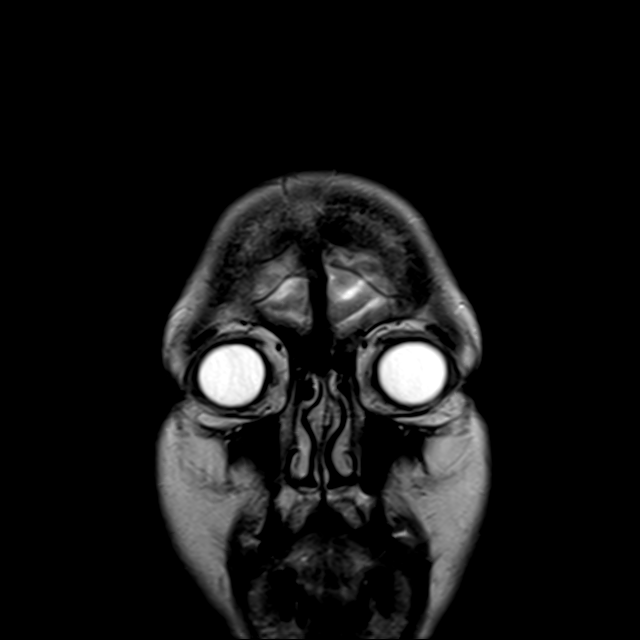
[im 13/26]
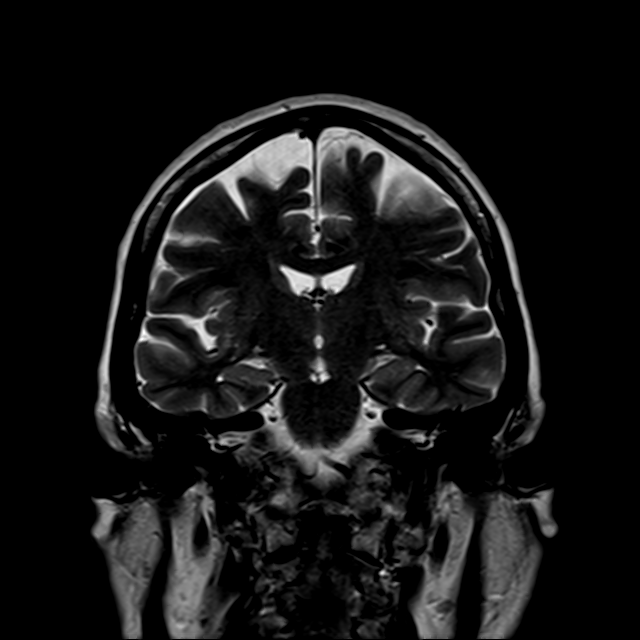
[im 26/26]
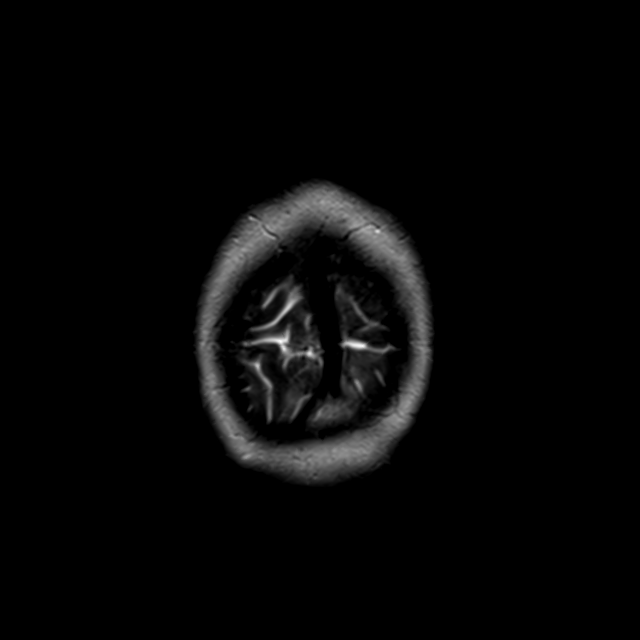

[42 of 48 positions shown; findings below may reference images not displayed]

FINDINGS: Brain: Punctate focus of diffusion hyperintensity within the right
occipital lobe may represent an acute/early subacute infarction
(series 5, image 54). No acute hemorrhage, hydrocephalus,
extra-axial collection or mass lesion. Very small chronic infarcts
in the right inferior cerebellum. Few nonspecific T2 FLAIR
hyperintensities in subcortical and periventricular white matter are
compatible with minimal chronic microvascular ischemic changes for
age.

Vascular: Normal flow voids.

Skull and upper cervical spine: Normal marrow signal.

Sinuses/Orbits: Negative.

Other: None.
IMPRESSION: 1. Probable punctate acute/early subacute infarct in the right
occipital lobe. No associated hemorrhage or mass effect.
2. Minimal chronic microvascular ischemic changes of white matter.
Very small chronic infarctions within the right inferior cerebellum.

These results will be called to the ordering clinician or
representative by the Radiologist Assistant, and communication
documented in the PACS or zVision Dashboard.

By: Nii Ashitey Majah M.D.

## 2019-12-13 DIAGNOSIS — N941 Unspecified dyspareunia: Secondary | ICD-10-CM | POA: Diagnosis not present

## 2019-12-13 DIAGNOSIS — Z01411 Encounter for gynecological examination (general) (routine) with abnormal findings: Secondary | ICD-10-CM | POA: Diagnosis not present

## 2019-12-13 DIAGNOSIS — L900 Lichen sclerosus et atrophicus: Secondary | ICD-10-CM | POA: Diagnosis not present

## 2020-01-31 DIAGNOSIS — R21 Rash and other nonspecific skin eruption: Secondary | ICD-10-CM | POA: Diagnosis not present

## 2020-02-09 DIAGNOSIS — D224 Melanocytic nevi of scalp and neck: Secondary | ICD-10-CM | POA: Diagnosis not present

## 2020-02-09 DIAGNOSIS — L738 Other specified follicular disorders: Secondary | ICD-10-CM | POA: Diagnosis not present

## 2020-02-09 DIAGNOSIS — D225 Melanocytic nevi of trunk: Secondary | ICD-10-CM | POA: Diagnosis not present

## 2020-02-09 DIAGNOSIS — L820 Inflamed seborrheic keratosis: Secondary | ICD-10-CM | POA: Diagnosis not present

## 2020-02-09 DIAGNOSIS — L821 Other seborrheic keratosis: Secondary | ICD-10-CM | POA: Diagnosis not present

## 2020-03-01 DIAGNOSIS — I10 Essential (primary) hypertension: Secondary | ICD-10-CM | POA: Diagnosis not present

## 2020-03-01 DIAGNOSIS — E119 Type 2 diabetes mellitus without complications: Secondary | ICD-10-CM | POA: Diagnosis not present

## 2020-03-01 DIAGNOSIS — N183 Chronic kidney disease, stage 3 unspecified: Secondary | ICD-10-CM | POA: Diagnosis not present

## 2020-03-01 DIAGNOSIS — E7800 Pure hypercholesterolemia, unspecified: Secondary | ICD-10-CM | POA: Diagnosis not present

## 2020-03-02 DIAGNOSIS — H43812 Vitreous degeneration, left eye: Secondary | ICD-10-CM | POA: Diagnosis not present

## 2020-04-02 DIAGNOSIS — Z1159 Encounter for screening for other viral diseases: Secondary | ICD-10-CM | POA: Diagnosis not present

## 2020-04-02 DIAGNOSIS — J40 Bronchitis, not specified as acute or chronic: Secondary | ICD-10-CM | POA: Diagnosis not present

## 2020-04-11 DIAGNOSIS — Z20822 Contact with and (suspected) exposure to covid-19: Secondary | ICD-10-CM | POA: Diagnosis not present

## 2020-04-18 DIAGNOSIS — Z1231 Encounter for screening mammogram for malignant neoplasm of breast: Secondary | ICD-10-CM | POA: Diagnosis not present

## 2020-04-23 DIAGNOSIS — Z1159 Encounter for screening for other viral diseases: Secondary | ICD-10-CM | POA: Diagnosis not present

## 2020-04-26 DIAGNOSIS — D125 Benign neoplasm of sigmoid colon: Secondary | ICD-10-CM | POA: Diagnosis not present

## 2020-04-26 DIAGNOSIS — D120 Benign neoplasm of cecum: Secondary | ICD-10-CM | POA: Diagnosis not present

## 2020-04-26 DIAGNOSIS — D123 Benign neoplasm of transverse colon: Secondary | ICD-10-CM | POA: Diagnosis not present

## 2020-04-26 DIAGNOSIS — Z1211 Encounter for screening for malignant neoplasm of colon: Secondary | ICD-10-CM | POA: Diagnosis not present

## 2020-07-03 IMAGING — RF RIGHT ANKLE - COMPLETE 3+ VIEW
1 series · 4 of 4 positions shown · non-contrast
Comparison: Right ankle radiographs-07/10/2018

CLINICAL DATA: Post ORIF right ankle fracture.

EXAM:
DG C-ARM 61-120 MIN; RIGHT ANKLE - COMPLETE 3+ VIEW
FLUOROSCOPY TIME:  14 seconds

[Series 1: run · 4 of 4 slices shown]
[im 1/4]
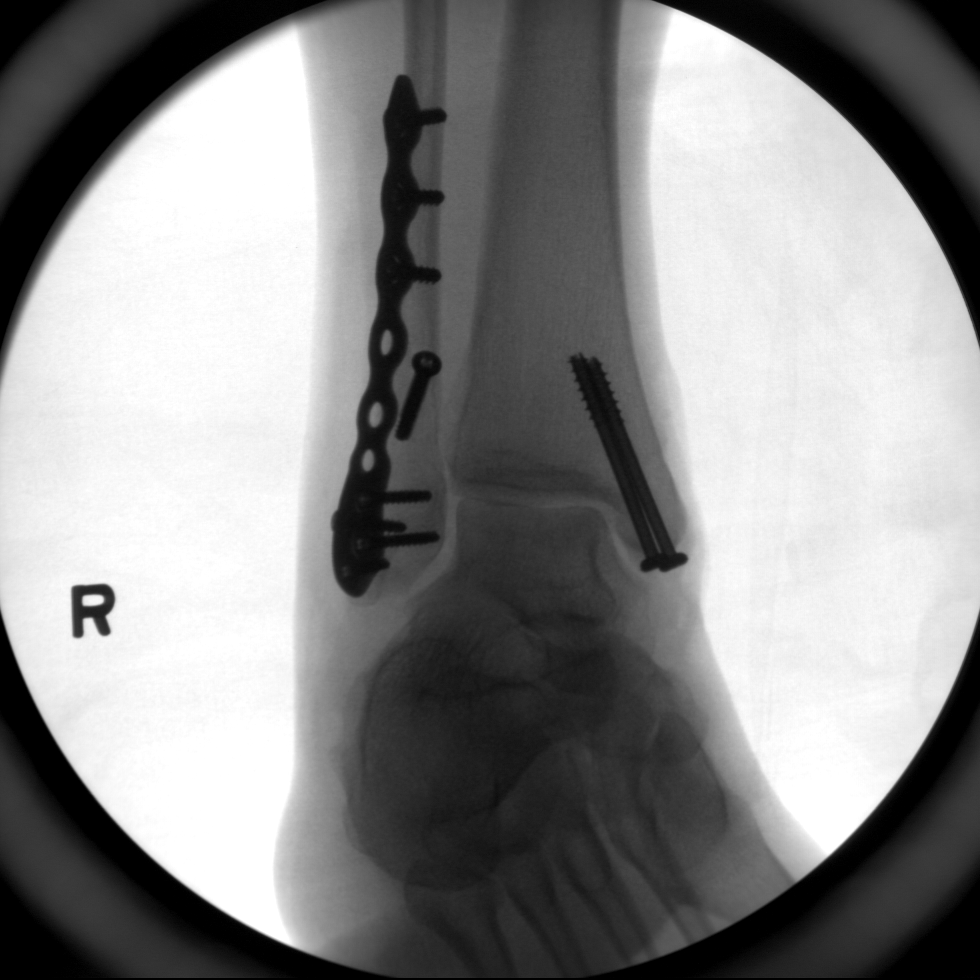
[im 2/4]
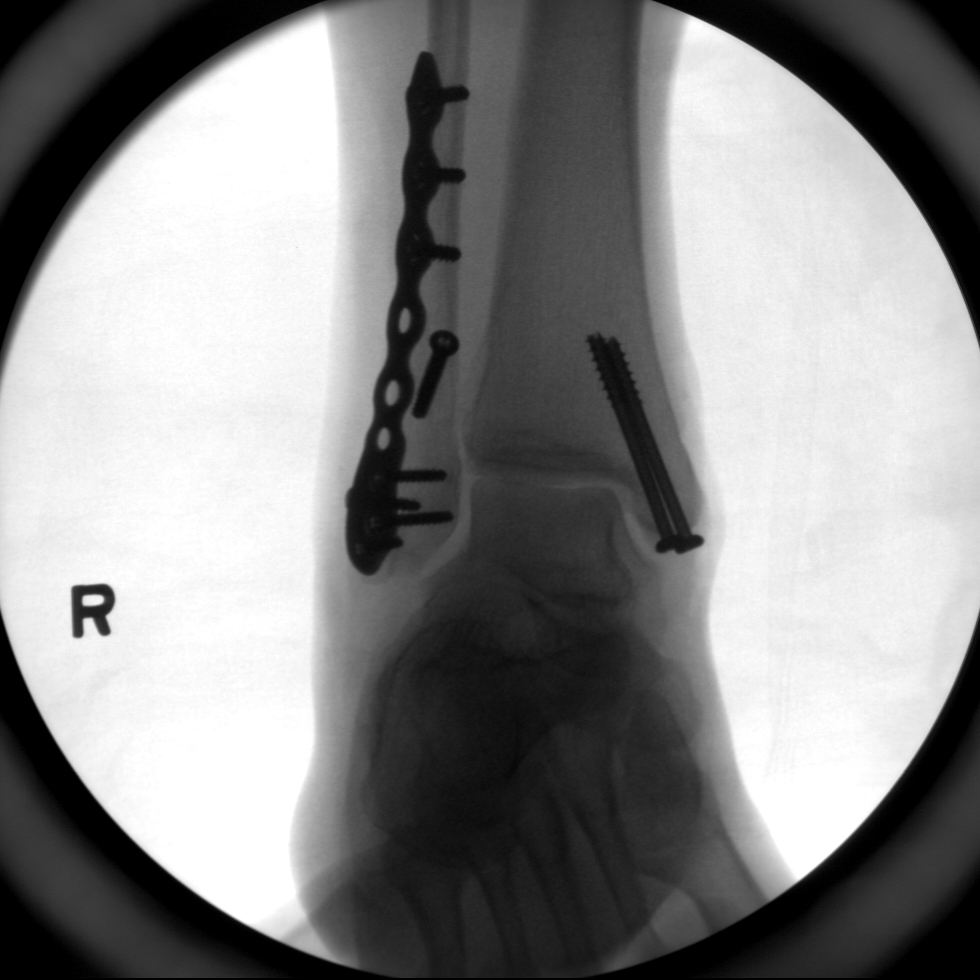
[im 3/4]
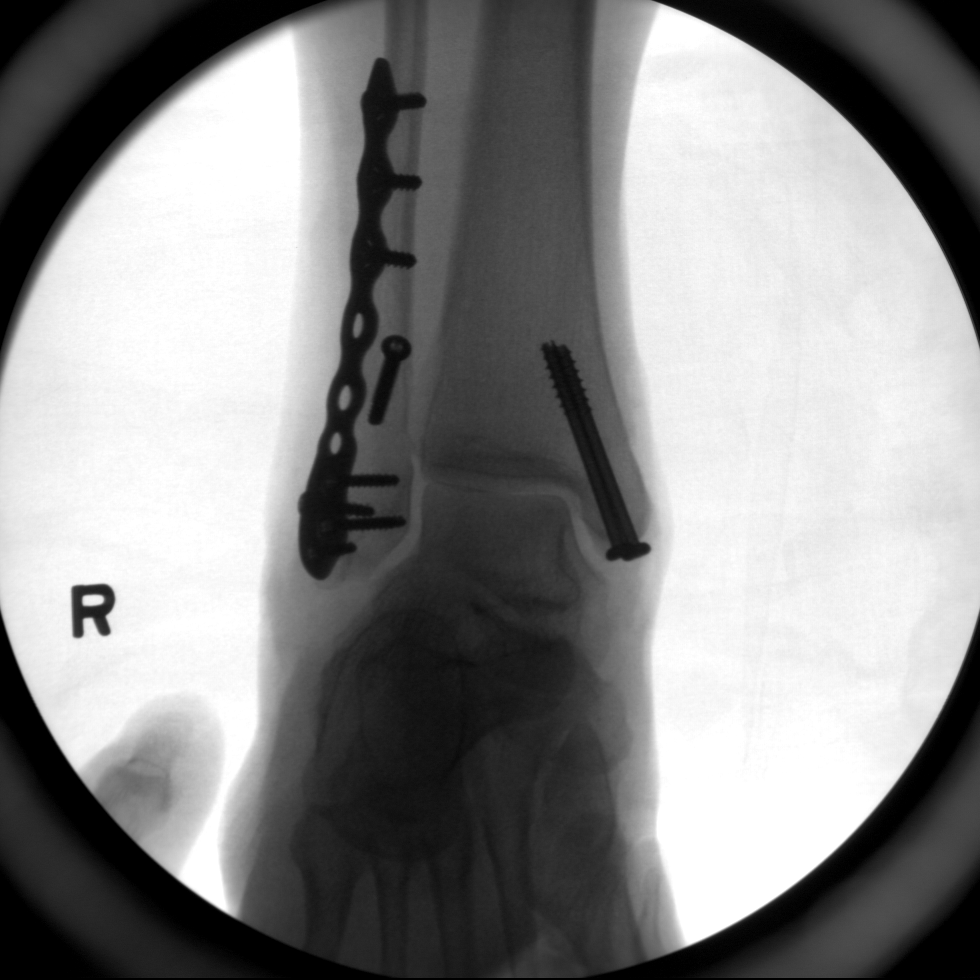
[im 4/4]
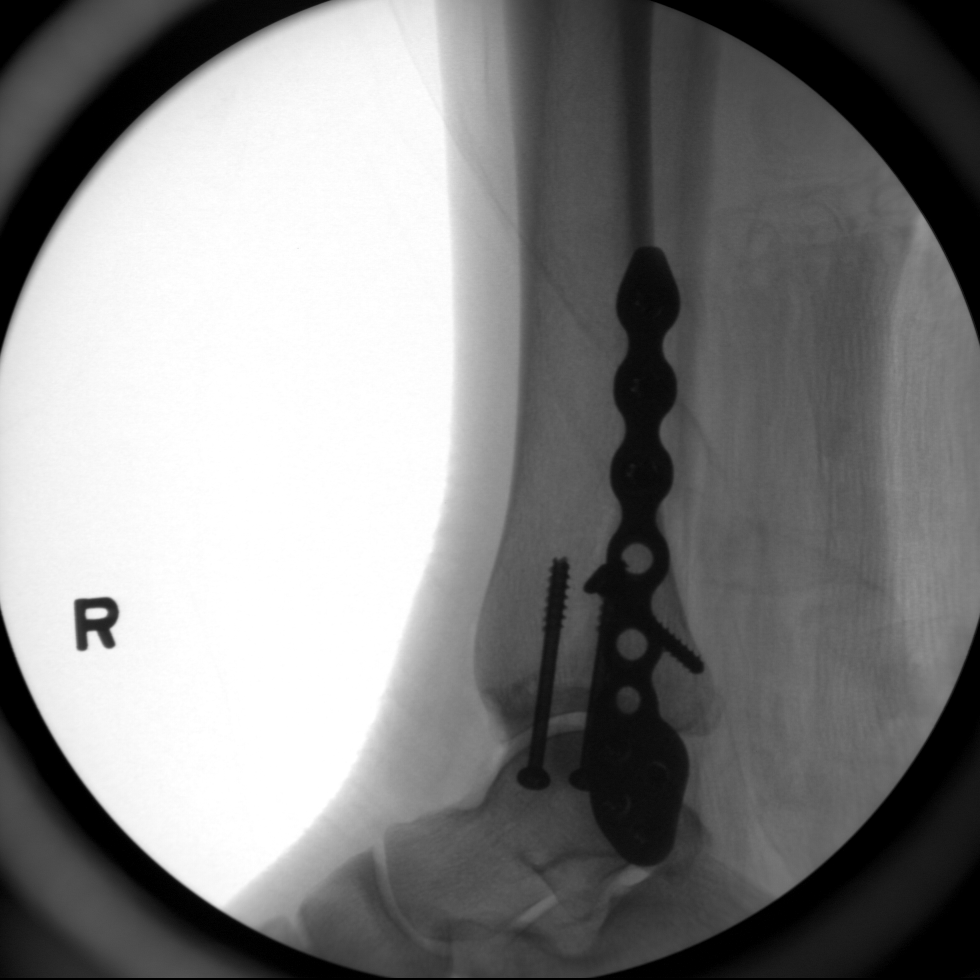

[4 of 4 positions shown; findings below may reference images not displayed]

FINDINGS: Four spot intraoperative fluoroscopic images of the right ankle are
provided for review

Images demonstrate the sequela of sideplate fixation of the distal
fibula with an additional cancellous screw transfixing the
previously identified distal fibular fracture. Post lag screw
fixation of the medial malleolus.

No dedicated fixation of known posterior malleolar fracture.

Alignment appears anatomic. Expected minimal amount of adjacent
subcutaneous stranding. No radiopaque foreign body.
IMPRESSION: Post ORIF of the medial and lateral malleoli without evidence of
complication.

## 2020-07-04 DIAGNOSIS — Z1159 Encounter for screening for other viral diseases: Secondary | ICD-10-CM | POA: Diagnosis not present

## 2020-08-24 DIAGNOSIS — D171 Benign lipomatous neoplasm of skin and subcutaneous tissue of trunk: Secondary | ICD-10-CM | POA: Diagnosis not present

## 2020-09-21 DIAGNOSIS — Z13220 Encounter for screening for lipoid disorders: Secondary | ICD-10-CM | POA: Diagnosis not present

## 2020-09-21 DIAGNOSIS — Z0000 Encounter for general adult medical examination without abnormal findings: Secondary | ICD-10-CM | POA: Diagnosis not present

## 2020-09-21 DIAGNOSIS — K219 Gastro-esophageal reflux disease without esophagitis: Secondary | ICD-10-CM | POA: Diagnosis not present

## 2020-09-21 DIAGNOSIS — E1169 Type 2 diabetes mellitus with other specified complication: Secondary | ICD-10-CM | POA: Diagnosis not present

## 2020-09-21 DIAGNOSIS — N183 Chronic kidney disease, stage 3 unspecified: Secondary | ICD-10-CM | POA: Diagnosis not present

## 2020-12-27 DIAGNOSIS — N183 Chronic kidney disease, stage 3 unspecified: Secondary | ICD-10-CM | POA: Diagnosis not present

## 2020-12-27 DIAGNOSIS — E782 Mixed hyperlipidemia: Secondary | ICD-10-CM | POA: Diagnosis not present

## 2020-12-27 DIAGNOSIS — I10 Essential (primary) hypertension: Secondary | ICD-10-CM | POA: Diagnosis not present

## 2020-12-27 DIAGNOSIS — E1169 Type 2 diabetes mellitus with other specified complication: Secondary | ICD-10-CM | POA: Diagnosis not present

## 2021-02-04 DIAGNOSIS — L298 Other pruritus: Secondary | ICD-10-CM | POA: Diagnosis not present

## 2021-02-04 DIAGNOSIS — L814 Other melanin hyperpigmentation: Secondary | ICD-10-CM | POA: Diagnosis not present

## 2021-02-04 DIAGNOSIS — D225 Melanocytic nevi of trunk: Secondary | ICD-10-CM | POA: Diagnosis not present

## 2021-02-04 DIAGNOSIS — L821 Other seborrheic keratosis: Secondary | ICD-10-CM | POA: Diagnosis not present

## 2021-04-23 DIAGNOSIS — Z1231 Encounter for screening mammogram for malignant neoplasm of breast: Secondary | ICD-10-CM | POA: Diagnosis not present

## 2021-05-03 DIAGNOSIS — R921 Mammographic calcification found on diagnostic imaging of breast: Secondary | ICD-10-CM | POA: Diagnosis not present

## 2021-05-03 DIAGNOSIS — R922 Inconclusive mammogram: Secondary | ICD-10-CM | POA: Diagnosis not present

## 2021-05-03 DIAGNOSIS — R928 Other abnormal and inconclusive findings on diagnostic imaging of breast: Secondary | ICD-10-CM | POA: Diagnosis not present

## 2021-05-17 ENCOUNTER — Other Ambulatory Visit: Payer: Self-pay | Admitting: Radiology

## 2021-05-17 DIAGNOSIS — N6321 Unspecified lump in the left breast, upper outer quadrant: Secondary | ICD-10-CM | POA: Diagnosis not present

## 2021-05-17 DIAGNOSIS — R921 Mammographic calcification found on diagnostic imaging of breast: Secondary | ICD-10-CM | POA: Diagnosis not present

## 2021-11-20 DIAGNOSIS — L918 Other hypertrophic disorders of the skin: Secondary | ICD-10-CM | POA: Diagnosis not present

## 2021-11-20 DIAGNOSIS — M7662 Achilles tendinitis, left leg: Secondary | ICD-10-CM | POA: Diagnosis not present

## 2021-11-26 DIAGNOSIS — Z124 Encounter for screening for malignant neoplasm of cervix: Secondary | ICD-10-CM | POA: Diagnosis not present

## 2021-11-28 ENCOUNTER — Ambulatory Visit: Payer: 59 | Attending: Orthopaedic Surgery | Admitting: Physical Therapy

## 2021-11-28 ENCOUNTER — Other Ambulatory Visit: Payer: Self-pay

## 2021-11-28 DIAGNOSIS — M25572 Pain in left ankle and joints of left foot: Secondary | ICD-10-CM | POA: Diagnosis not present

## 2021-11-28 NOTE — Therapy (Addendum)
OUTPATIENT PHYSICAL THERAPY LOWER EXTREMITY EVALUATION   Patient Name: Meghan Mcdonald MRN: [id number] DOB:[birth year], 66 y.o., female Today's Date: 11/28/2021   PT End of Session - 11/28/21 1135     Visit Number 1    Number of Visits 12    Date for PT Re-Evaluation 01/09/22    Authorization Type FOTO AT LEAST EVERY 5TH VISIT.  PROGRESS NOTE AT 10TH VISIT.  KX MODIFIER AFTER 15 VISITS.    PT Start Time 1030    PT Stop Time 1119    PT Time Calculation (min) 49 min    Activity Tolerance Patient tolerated treatment well    Behavior During Therapy WFL for tasks assessed/performed             Past Medical History:  Diagnosis Date   Chronic shoulder pain    Depression    GERD (gastroesophageal reflux disease)    History of kidney stones    Hyperlipemia    Hypertension    Insomnia    Migraine    Overweight    Rapid palpitations    Past Surgical History:  Procedure Laterality Date   BLADDER SURGERY     CARPAL TUNNEL RELEASE     CHOLECYSTECTOMY     ORIF ANKLE FRACTURE Right 07/22/2018   Procedure: OPEN REDUCTION INTERNAL FIXATION (ORIF) RIGHT BIMALL ANKLE FRACTURE;  Surgeon: Erle Crocker, MD;  Location: [place];  Service: Orthopedics;  Laterality: Right;   TONSILLECTOMY     Patient Active Problem List   Diagnosis Date Noted   Acute CVA (cerebrovascular accident) ([place]) 12/23/2017   Severe vertigo 12/22/2017   Vertigo 12/22/2017   Dyslipidemia 12/22/2017   Essential hypertension 12/22/2017   Nausea & vomiting 12/22/2017    REFERRING PROVIDER: Radene Journey MD  REFERRING DIAG: Left midsubstance Achilles tendinopathy.  THERAPY DIAG:  Pain in left ankle and joints of left foot  Rationale for Evaluation and Treatment Rehabilitation  ONSET DATE: Flare-up about two months ago.  SUBJECTIVE:   SUBJECTIVE STATEMENT: The patient presents to the clinic today with c/o left Achilles pain that has been worsening over the last two months.  He pain at  rets today is low but can rise to higher levels (5+/10) with increase up time and activities.  Rest and ice help decrease pain.     PERTINENT HISTORY: Left CTR, Right tib/fib ORIF, DM.  PAIN:  Are you having pain? Yes: NPRS scale: 2/10 Pain location: Left Achilles Pain description: Ache, sore. Aggravating factors: As above. Relieving factors: As above  PRECAUTIONS: None  WEIGHT BEARING RESTRICTIONS No  FALLS:  Has patient fallen in last 6 months? Yes. Number of falls 3..."I'm clumsy."  LIVING ENVIRONMENT: Lives with: lives with their spouse Lives in: House/apartment Has following equipment at home: None  OCCUPATION: Retired.  PLOF: Independent  PATIENT GOALS Get out of pain.   OBJECTIVE:   PATIENT SURVEYS:  FOTO .  Complete.  EDEMA:  No observable edema.  PALPATION: Tender to palpation left Achilles with notable "bump" mid-portion.  LOWER EXTREMITY ROM AND STRENGTH: Full left ankle range of motion and no notable strength deficits.      GAIT: WNL this morning.  TODAY'S TREATMENT: Pre-mod e'stim at 80-150 Hz x 15 minutes with vasopneumatic on low.  Normal modality response following removal of modality.   PATIENT EDUCATION:  Short duration ice massage (2 to 3 minutes).   ASSESSMENT:  CLINICAL IMPRESSION: The patient presents to OPPT with c/o left Achilles tendon pain.  Pain has  provided her from being as active as she would like to be be.  Left ankle strength and range of motion is normal.  She is palpably tender with a notable "bump" in the mid-portion of her Achilles tendon.  Patient will benefit from skilled physical therapy intervention to address pain and deficits.   OBJECTIVE IMPAIRMENTS decreased activity tolerance and pain.   ACTIVITY LIMITATIONS standing and locomotion level  PARTICIPATION LIMITATIONS: cleaning and yard work  CLINICAL DECISION MAKING: Stable/uncomplicated  EVALUATION COMPLEXITY: Low   GOALS:  LONG TERM GOALS:  Target date: 01/09/2022   Ind with a HEP. Baseline:  Goal status: INITIAL  2.  Walk a community distance with pain not > 2/10. Baseline:  Goal status: INITIAL  3.  Perform ADL's with pain not > 2/10. Baseline:  Goal status: INITIAL    PLAN: PT FREQUENCY: 2x/week  PT DURATION: 6 weeks  PLANNED INTERVENTIONS: Therapeutic exercises, Therapeutic activity, Neuromuscular re-education, Patient/Family education, Self Care, Electrical stimulation, Cryotherapy, Moist heat, Vasopneumatic device, Ultrasound, Ionotophoresis '4mg'$ /ml Dexamethasone, and Manual therapy  PLAN FOR NEXT SESSION: Combo e'stim/US, STW/M including IASTM, calf stretching, rockerboard, Iontophoresis per signed cert, heel raises.   Latish Toutant, [place], PT 11/28/2021, 11:37 AM

## 2021-12-05 ENCOUNTER — Encounter: Payer: Self-pay | Admitting: Physical Therapy

## 2021-12-05 ENCOUNTER — Ambulatory Visit: Payer: 59 | Admitting: Physical Therapy

## 2021-12-05 DIAGNOSIS — M25572 Pain in left ankle and joints of left foot: Secondary | ICD-10-CM | POA: Diagnosis not present

## 2021-12-05 NOTE — Therapy (Signed)
OUTPATIENT PHYSICAL THERAPY LOWER EXTREMITY TREATMENT   Patient Name: Meghan Mcdonald MRN: [id number] DOB:[birth year], 66 y.o., female Today's Date: 12/05/2021   PT End of Session - 12/05/21 1723     Visit Number 2    Number of Visits 12    Date for PT Re-Evaluation 01/09/22    Authorization Type FOTO AT LEAST EVERY 5TH VISIT.  PROGRESS NOTE AT 10TH VISIT.  KX MODIFIER AFTER 15 VISITS.    PT Start Time 0317    PT Stop Time 0355    PT Time Calculation (min) 38 min    Activity Tolerance Patient tolerated treatment well    Behavior During Therapy WFL for tasks assessed/performed             Past Medical History:  Diagnosis Date   Chronic shoulder pain    Depression    GERD (gastroesophageal reflux disease)    History of kidney stones    Hyperlipemia    Hypertension    Insomnia    Migraine    Overweight    Rapid palpitations    Past Surgical History:  Procedure Laterality Date   BLADDER SURGERY     CARPAL TUNNEL RELEASE     CHOLECYSTECTOMY     ORIF ANKLE FRACTURE Right 07/22/2018   Procedure: OPEN REDUCTION INTERNAL FIXATION (ORIF) RIGHT BIMALL ANKLE FRACTURE;  Surgeon: Erle Crocker, MD;  Location: [place];  Service: Orthopedics;  Laterality: Right;   TONSILLECTOMY     Patient Active Problem List   Diagnosis Date Noted   Acute CVA (cerebrovascular accident) ([place]) 12/23/2017   Severe vertigo 12/22/2017   Vertigo 12/22/2017   Dyslipidemia 12/22/2017   Essential hypertension 12/22/2017   Nausea & vomiting 12/22/2017    REFERRING PROVIDER: Radene Journey MD  REFERRING DIAG: Left midsubstance Achilles tendinopathy.  THERAPY DIAG:  Pain in left ankle and joints of left foot  Rationale for Evaluation and Treatment Rehabilitation  ONSET DATE: Flare-up about two months ago.  SUBJECTIVE:   SUBJECTIVE STATEMENT: No new complaints. PERTINENT HISTORY: Left CTR, Right tib/fib ORIF, DM.  PAIN:  Are you having pain? Yes: NPRS scale: 1/10 Pain  location: Left Achilles Pain description: Ache, sore. Aggravating factors: As above. Relieving factors: As above   OBJECTIVE: .  TODAY'S TREATMENT: [place] at 1.50 w/CM2 x 7 minutes to patient's left Achilles f/b STW/M including gentle IASTM x 7 miniutes f/b Pre-mod e'stim at 80-150 Hz x 15 minutes with vasopneumatic on low to patient's left Achilles  Normal modality response following removal of modality.   PATIENT EDUCATION:  Short duration ice massage (2 to 3 minutes).   ASSESSMENT:  CLINICAL IMPRESSION: Patient presented to the clinic with a low pain-level.  She did well with treatment and tolerated without complaint with normal modality response following removal of modality.     GOALS:  LONG TERM GOALS: Target date: 01/16/2022   Ind with a HEP. Baseline:  Goal status: INITIAL  2.  Walk a community distance with pain not > 2/10. Baseline:  Goal status: INITIAL  3.  Perform ADL's with pain not > 2/10. Baseline:  Goal status: INITIAL    PLAN: PT FREQUENCY: 2x/week  PT DURATION: 6 weeks  PLANNED INTERVENTIONS: Therapeutic exercises, Therapeutic activity, Neuromuscular re-education, Patient/Family education, Self Care, Electrical stimulation, Cryotherapy, Moist heat, Vasopneumatic device, Ultrasound, Ionotophoresis '4mg'$ /ml Dexamethasone, and Manual therapy  PLAN FOR NEXT SESSION: Combo e'stim/US, STW/M including IASTM, calf stretching, rockerboard, Iontophoresis per signed cert, heel raises.   Kipton Skillen, [place], PT 12/05/2021,  5:28 PM

## 2021-12-12 ENCOUNTER — Ambulatory Visit: Payer: 59 | Admitting: *Deleted

## 2021-12-12 DIAGNOSIS — M25572 Pain in left ankle and joints of left foot: Secondary | ICD-10-CM | POA: Diagnosis not present

## 2021-12-12 NOTE — Therapy (Signed)
OUTPATIENT PHYSICAL THERAPY LOWER EXTREMITY TREATMENT   Patient Name: Meghan Mcdonald MRN: [id number] DOB:Jul 20, 1955, 66 y.o., female Today's Date: 12/12/2021   PT End of Session - 12/12/21 1521     Visit Number 3    Number of Visits 12    Date for PT Re-Evaluation 01/09/22    Authorization Type FOTO AT LEAST EVERY 5TH VISIT.  PROGRESS NOTE AT 10TH VISIT.  KX MODIFIER AFTER 15 VISITS.    PT Start Time 1515    PT Stop Time 1600    PT Time Calculation (min) 45 min             Past Medical History:  Diagnosis Date   Chronic shoulder pain    Depression    GERD (gastroesophageal reflux disease)    History of kidney stones    Hyperlipemia    Hypertension    Insomnia    Migraine    Overweight    Rapid palpitations    Past Surgical History:  Procedure Laterality Date   BLADDER SURGERY     CARPAL TUNNEL RELEASE     CHOLECYSTECTOMY     ORIF ANKLE FRACTURE Right 07/22/2018   Procedure: OPEN REDUCTION INTERNAL FIXATION (ORIF) RIGHT BIMALL ANKLE FRACTURE;  Surgeon: Erle Crocker, MD;  Location: [place];  Service: Orthopedics;  Laterality: Right;   TONSILLECTOMY     Patient Active Problem List   Diagnosis Date Noted   Acute CVA (cerebrovascular accident) ([place]) 12/23/2017   Severe vertigo 12/22/2017   Vertigo 12/22/2017   Dyslipidemia 12/22/2017   Essential hypertension 12/22/2017   Nausea & vomiting 12/22/2017    REFERRING PROVIDER: Radene Journey MD  REFERRING DIAG: Left midsubstance Achilles tendinopathy.  THERAPY DIAG:  Pain in left ankle and joints of left foot  Rationale for Evaluation and Treatment Rehabilitation  ONSET DATE: Flare-up about two months ago.  SUBJECTIVE:   SUBJECTIVE STATEMENT: 1/10 pain today LT heel. Doing better PERTINENT HISTORY: Left CTR, Right tib/fib ORIF, DM.  PAIN:  Are you having pain? Yes: NPRS scale: 1/10 Pain location: Left Achilles Pain description: Ache, sore. Aggravating factors: As above. Relieving  factors: As above   OBJECTIVE: .                                    TODAY'S TREATMENT:    12-12-21  [place] at 1.50 w/CM2 x 8 minutes to patient's left Achilles f/b STW/M including gentle IASTM x 8 miniutes f/b  Pre-mod e'stim at 80-150 Hz x 15 minutes with HMP    PATIENT EDUCATION:  Short duration ice massage (2 to 3 minutes).   ASSESSMENT:  CLINICAL IMPRESSION: Pt arrived today doing fairly well with low pain levels , but still with tenderness along achilles noted during STW. Normal response after modality removal.     GOALS:  LONG TERM GOALS: Target date: 01/23/2022   Ind with a HEP. Baseline:  Goal status: INITIAL  2.  Walk a community distance with pain not > 2/10. Baseline:  Goal status: INITIAL  3.  Perform ADL's with pain not > 2/10. Baseline:  Goal status: INITIAL    PLAN: PT FREQUENCY: 2x/week  PT DURATION: 6 weeks  PLANNED INTERVENTIONS: Therapeutic exercises, Therapeutic activity, Neuromuscular re-education, Patient/Family education, Self Care, Electrical stimulation, Cryotherapy, Moist heat, Vasopneumatic device, Ultrasound, Ionotophoresis '4mg'$ /ml Dexamethasone, and Manual therapy  PLAN FOR NEXT SESSION: Combo e'stim/US, STW/M including IASTM, calf stretching, rockerboard,, heel raises.  Taivon Haroon,CHRIS, PTA 12/12/2021, 4:12 PM

## 2021-12-26 ENCOUNTER — Ambulatory Visit: Payer: 59 | Attending: Orthopaedic Surgery | Admitting: Physical Therapy

## 2021-12-26 DIAGNOSIS — M25572 Pain in left ankle and joints of left foot: Secondary | ICD-10-CM | POA: Insufficient documentation

## 2021-12-26 NOTE — Therapy (Addendum)
OUTPATIENT PHYSICAL THERAPY LOWER EXTREMITY TREATMENT   Patient Name: Meghan Mcdonald MRN: [id number] DOB:27-Feb-1956, 66 y.o., female Today's Date: 12/26/2021   PT End of Session - 12/26/21 1631     Visit Number 4    Number of Visits 12    Date for PT Re-Evaluation 01/09/22    Authorization Type FOTO AT LEAST EVERY 5TH VISIT.  PROGRESS NOTE AT 10TH VISIT.  KX MODIFIER AFTER 15 VISITS.    PT Start Time [phone]    PT Stop Time 0405    PT Time Calculation (min) 49 min    Activity Tolerance Patient tolerated treatment well    Behavior During Therapy WFL for tasks assessed/performed              Past Medical History:  Diagnosis Date   Chronic shoulder pain    Depression    GERD (gastroesophageal reflux disease)    History of kidney stones    Hyperlipemia    Hypertension    Insomnia    Migraine    Overweight    Rapid palpitations    Past Surgical History:  Procedure Laterality Date   BLADDER SURGERY     CARPAL TUNNEL RELEASE     CHOLECYSTECTOMY     ORIF ANKLE FRACTURE Right 07/22/2018   Procedure: OPEN REDUCTION INTERNAL FIXATION (ORIF) RIGHT BIMALL ANKLE FRACTURE;  Surgeon: Erle Crocker, MD;  Location: [place];  Service: Orthopedics;  Laterality: Right;   TONSILLECTOMY     Patient Active Problem List   Diagnosis Date Noted   Acute CVA (cerebrovascular accident) ([place]) 12/23/2017   Severe vertigo 12/22/2017   Vertigo 12/22/2017   Dyslipidemia 12/22/2017   Essential hypertension 12/22/2017   Nausea & vomiting 12/22/2017    REFERRING PROVIDER: Radene Journey MD  REFERRING DIAG: Left midsubstance Achilles tendinopathy.  THERAPY DIAG:  Pain in left ankle and joints of left foot  Rationale for Evaluation and Treatment Rehabilitation  ONSET DATE: Flare-up about two months ago.  SUBJECTIVE:   SUBJECTIVE STATEMENT: Had a flare-up working outside and going up an incline but doing good now. PERTINENT HISTORY: Left CTR, Right tib/fib ORIF,  DM.  PAIN:  Are you having pain? Yes: NPRS scale: .5/10 Pain location: Left Achilles Pain description: Ache, sore. Aggravating factors: As above. Relieving factors: As above   OBJECTIVE: .                                    TODAY'S TREATMENT:    12/26/21             Rockerboard in parallel bars x 3 minutes [place] at 1.50 w/CM2 x 8 minutes to patient's left Achilles f/b STW/M including gentle IASTM x 13 miniutes f/b  Vasopneumatic x 15 minutes   PATIENT EDUCATION:  Heel lifts and eccentrics.   ASSESSMENT:  CLINICAL IMPRESSION: Patient very pleased with her progress.  No pain after treatment.  Current goals are met.  She feels she may be ready to discharge.  Will place patient on hold at this time.   GOALS:  LONG TERM GOALS: Target date: 02/06/2022   Ind with a HEP. Baseline:  Goal status: MET.  2.  Walk a community distance with pain not > 2/10. Baseline:  Goal status: MET.  3.  Perform ADL's with pain not > 2/10. Baseline:  Goal status: MET    PLAN: PT FREQUENCY: 2x/week  PT DURATION: 6 weeks  PLANNED INTERVENTIONS: Therapeutic exercises, Therapeutic activity, Neuromuscular re-education, Patient/Family education, Self Care, Electrical stimulation, Cryotherapy, Moist heat, Vasopneumatic device, Ultrasound, Ionotophoresis 4mg /ml Dexamethasone, and Manual therapy  PLAN FOR NEXT SESSION: Combo e'stim/US, STW/M including IASTM, calf stretching, rockerboard,, heel raises.   Johnnye Sandford, [place], PT 12/26/2021, 4:36 PM   PHYSICAL THERAPY DISCHARGE SUMMARY  Visits from Start of Care: 4.  Current functional level related to goals / functional outcomes: See above.   Remaining deficits: Goals met.   Education / Equipment: HEP.   Patient agrees to discharge. Patient goals were met. Patient is being discharged due to meeting the stated rehab goals.    Mali Ramere Downs MPT

## 2022-02-11 DIAGNOSIS — D3617 Benign neoplasm of peripheral nerves and autonomic nervous system of trunk, unspecified: Secondary | ICD-10-CM | POA: Diagnosis not present

## 2022-02-11 DIAGNOSIS — D1801 Hemangioma of skin and subcutaneous tissue: Secondary | ICD-10-CM | POA: Diagnosis not present

## 2022-02-11 DIAGNOSIS — L821 Other seborrheic keratosis: Secondary | ICD-10-CM | POA: Diagnosis not present

## 2022-02-11 DIAGNOSIS — L905 Scar conditions and fibrosis of skin: Secondary | ICD-10-CM | POA: Diagnosis not present

## 2022-02-11 DIAGNOSIS — L814 Other melanin hyperpigmentation: Secondary | ICD-10-CM | POA: Diagnosis not present

## 2022-02-11 DIAGNOSIS — D485 Neoplasm of uncertain behavior of skin: Secondary | ICD-10-CM | POA: Diagnosis not present

## 2022-03-05 DIAGNOSIS — E119 Type 2 diabetes mellitus without complications: Secondary | ICD-10-CM | POA: Diagnosis not present

## 2022-04-29 DIAGNOSIS — Z1231 Encounter for screening mammogram for malignant neoplasm of breast: Secondary | ICD-10-CM | POA: Diagnosis not present

## 2022-05-08 DIAGNOSIS — R928 Other abnormal and inconclusive findings on diagnostic imaging of breast: Secondary | ICD-10-CM | POA: Diagnosis not present

## 2022-05-08 DIAGNOSIS — N6489 Other specified disorders of breast: Secondary | ICD-10-CM | POA: Diagnosis not present

## 2022-05-08 DIAGNOSIS — R922 Inconclusive mammogram: Secondary | ICD-10-CM | POA: Diagnosis not present

## 2022-05-14 ENCOUNTER — Other Ambulatory Visit: Payer: Self-pay | Admitting: Radiology

## 2022-05-14 DIAGNOSIS — D0511 Intraductal carcinoma in situ of right breast: Secondary | ICD-10-CM | POA: Diagnosis not present

## 2022-05-16 ENCOUNTER — Encounter: Payer: Self-pay | Admitting: Family Medicine

## 2022-05-19 ENCOUNTER — Telehealth: Payer: Self-pay | Admitting: Hematology

## 2022-05-19 NOTE — Telephone Encounter (Signed)
Spoke to patient to confirm upcoming afternoon [facility] clinic appointment on 2/7, paperwork will be sent via [place].   Gave location and time, also informed patient that the surgeon's office would be calling as well to get information from them similar to the packet that they will be receiving so make sure to do both.  Reminded patient that all providers will be coming to the clinic to see them HERE and if they had any questions to not hesitate to reach back out to myself or their navigators.

## 2022-05-21 ENCOUNTER — Other Ambulatory Visit: Payer: Self-pay | Admitting: Radiology

## 2022-05-21 DIAGNOSIS — D0511 Intraductal carcinoma in situ of right breast: Secondary | ICD-10-CM | POA: Diagnosis not present

## 2022-05-26 ENCOUNTER — Encounter: Payer: Self-pay | Admitting: *Deleted

## 2022-05-26 DIAGNOSIS — C50419 Malignant neoplasm of upper-outer quadrant of unspecified female breast: Secondary | ICD-10-CM | POA: Insufficient documentation

## 2022-05-26 DIAGNOSIS — D0511 Intraductal carcinoma in situ of right breast: Secondary | ICD-10-CM

## 2022-05-27 NOTE — Progress Notes (Unsigned)
Meghan Mcdonald   Telephone:(336) [phone] Fax:([phone]) [place] Note   Patient Care Team: Aura Dials, MD as PCP - General (Family Medicine) Jovita Kussmaul, MD as Consulting Physician (General Surgery) Truitt Merle, MD as Consulting Physician (Hematology) Kyung Rudd, MD as Consulting Physician (Radiation Oncology) Mauro Kaufmann, RN as Oncology Nurse Navigator Rockwell Germany, RN as Oncology Nurse Navigator  Date of Service:  05/28/2022   CHIEF COMPLAINTS/PURPOSE OF CONSULTATION:  Ductal carcinoma in situ (DCIS) of right breast   REFERRING PHYSICIAN:   [facility]   ASSESSMENT & PLAN:  Meghan Mcdonald is a 67 y.o. post-menopausal female with a history of   1. 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 -She is a candidate for breast conservation surgery. She has been seen by breast surgeon Dr. Marlou Starks, who recommends lumpectomy. -Her DCIS will be cured by complete surgical resection. Any form of adjuvant therapy is preventive. -I reviewed her risk and treatment benefits using the Breast Cancer Nomogram from [facility]). Based on family, PMx and lifestyle she has a 12% risk of developing future breast cancer in the next 10 years. Her risk would drop to 6-7% with RT or Antiestrogen therapy alone. With both adjuvant treatments her risk would decrease to 3%.  --Given the strong ER and PR positivity, I do recommend adjuvant tamoxifen reduce her risk of future breast cancer recurrence,  The potential benefit and side effects, which includes but not limited to, hot flash, skin and vaginal dryness, metabolic changes ( increased blood glucose, cholesterol, weight, etc.), slightly in increased risk of cardiovascular disease, cataracts, muscular and joint discomfort, etc, were discussed with her in  great details.  I recommend low-dose tamoxifen 5 mg daily for 3 days, so the risk of thrombosis and endometrial cancer is minimal.  She is interested, and we'll start after she completes radiation. -She will likely benefit from breast radiation if she undergo lumpectomy to decrease the risk of breast cancer. She will discuss this further with Dr. Lisbeth Renshaw today.  -We also discussed that biopsy may have sampling limitation, we will review her surgical path, to see if she has any invasive carcinoma components. -We discussed breast cancer surveillance after she completes treatment, Including annual mammogram, breast exam every 6-12 months. -lab reviewed   2. Bone Health   She has never had a DEXA. We will obtain one for baseline   PLAN:  -She will proceed with right lumpectomy soon  -I recommend the antiestrogen therapy with tamoxifen 5 mg daily for 3 years -She is also interested in adjuvant radiation   Oncology History Overview Note   Cancer Staging  Ductal carcinoma in situ (DCIS) of right breast Staging form: Breast, AJCC 8th Edition - Clinical stage from 05/14/2022: Stage 0 (cTis (DCIS), cN0, cM0, G2, ER+, PR+, HER2: Not Assessed) - Signed by Truitt Merle, MD on 05/27/2022 Stage prefix: Initial diagnosis Histologic grading system: 3 grade system     Ductal carcinoma in situ (DCIS) of right breast  05/14/2022 Cancer Staging   Staging form: Breast, AJCC 8th Edition - Clinical stage from 05/14/2022: Stage 0 (cTis (DCIS), cN0, cM0, G2, ER+, PR+, HER2: Not Assessed) - Signed by Truitt Merle, MD on 05/27/2022 Stage prefix: Initial diagnosis Histologic grading system: 3 grade system   05/26/2022 Initial Diagnosis   Ductal  carcinoma in situ (DCIS) of right breast      HISTORY OF PRESENTING ILLNESS:  Meghan Mcdonald 67 y.o. female is a here because of breast cancer. The patient was referred by [facility]. The patient presents to the clinic today accompanied by her husband..   She had routine screening  mammography on 04/29/2022 showing a possible abnormality in the Right Breast. She underwent bilateral diagnostic mammography and  breast ultrasonography on 05/08/2022 showing:  The two groups are separated by 4.5 cm as measured on the CC view. The more central group measures 55m and appears pleomorphic and amorphous. The more medial group measures 747mand appear amorphous. Loosely grouped classification are seen in the medial right breast anterior depth. Two groups of classifications in the right medial breast are suspicious for malignancy.  Biopsy on 05/22/2022 showed: . Marland Kitchenrognostic indicators significant for: estrogen receptor, 100% positive and progesterone receptor, 10% positive.   Today the patient notes ,this not the first abnormal mammogram. Pt states she had some arthritis in her big toe.   She has a PMHx of.... Depression GERD Hypertension Chronic shoulder pain Kidney Stones Hyperlipemia Insomnia Migraine   Socially... Married 2 children   GYN HISTORY  Menarchal: 14 LMP: 2019 Contraceptive:Birth Control Pills (6years)     HRT: none GP:G2P2  age:9    REVIEW OF SYSTEMS:    Constitutional: Denies fevers, chills or abnormal night sweats Eyes: Denies blurriness of vision, double vision or watery eyes Ears, nose, mouth, throat, and face: Denies mucositis or sore throat Respiratory: Denies cough, dyspnea or wheezes Cardiovascular: Denies palpitation, chest discomfort or lower extremity swelling Gastrointestinal:  Denies nausea, heartburn or change in bowel habits Skin: Denies abnormal skin rashes Lymphatics: Denies new lymphadenopathy or easy bruising Neurological:Denies numbness, tingling or new weaknesses Behavioral/Psych: Anxiety  at times,Mood is stable, no new changes  All other systems were reviewed with the patient and are negative.   MEDICAL HISTORY:  Past Medical History:  Diagnosis Date   Anxiety    Breast cancer ([facility]   Chronic shoulder pain     Depression    GERD (gastroesophageal reflux disease)    History of kidney stones    Hyperlipemia    Hypertension    Insomnia    Lichen sclerosus of vulva    Migraine    Overweight    Rapid palpitations    Vent[facility] ([facility]oxysmal) ([facility]    SURGICAL HISTORY: Past Surgical History:  Procedure Laterality Date   BLADDER SURGERY     CARPAL TUNNEL RELEASE     CATARACT EXTRACTION Bilateral    CHOLECYSTECTOMY     ORIF ANKLE FRACTURE Right 07/22/2018   Procedure: OPEN REDUCTION INTERNAL FIXATION (ORIF) RIGHT BIMALL ANKLE FRACTURE;  Surgeon: AdErle CrockerMD;  [place] Service: Orthopedics;  Laterality: Right;   ORIF TIBIA & FIBULA FRACTURES     TONSILLECTOMY      SOCIAL HISTORY: Social History   Socioeconomic History   Marital status: Married    Spouse name: Not on file   Number of children: 2   Years of education: Not on file   Highest education level: Not on file  Occupational History   Not on file  Tobacco Use   Smoking status: Never   Smokeless tobacco: Never  Substance and Sexual Activity   Alcohol use: Yes    Alcohol/week: 3.0 standard drinks of alcohol    Types: 3 Glasses of wine per week   Drug use: Never  Sexual activity: Not on file  Other Topics Concern   Not on file  Social History Narrative      Lives with husband   Social Determinants of Health   Financial Resource Strain: Not on file  Food Insecurity: Not on file  Transportation Needs: Not on file  Physical Activity: Not on file  Stress: Not on file  Social Connections: Not on file  Intimate Partner Violence: Not on file    FAMILY HISTORY: Family History  Problem Relation Age of Onset   Hypertension Mother    Hypertension Father     ALLERGIES:  is allergic to amitriptyline, aspirin, micardis [telmisartan], hibiclens [chlorhexidine gluconate], loestrin [norethindrone acet-ethinyl est], and prednisone.  MEDICATIONS:  Current Outpatient  Medications  Medication Sig Dispense Refill   empagliflozin (JARDIANCE) 10 MG TABS tablet Take 10 mg by mouth daily.     acetaminophen (TYLENOL) 500 MG tablet Take 1,000 mg by mouth as needed for moderate pain.     aspirin EC 81 MG EC tablet Take 1 tablet (81 mg total) by mouth daily. (Patient not taking: Reported on 11/28/2021) 30 tablet 0   atorvastatin (LIPITOR) 80 MG tablet Take 1 tablet (80 mg total) by mouth daily at 6 PM. 30 tablet 0   diazepam (VALIUM) 5 MG tablet Take 1 tablet (5 mg total) by mouth every 8 (eight) hours as needed (nausea and dizziness). 15 tablet 0   hydrochlorothiazide (HYDRODIURIL) 25 MG tablet Take 25 mg by mouth daily.     meclizine (ANTIVERT) 25 MG tablet Take 2 tablets (50 mg total) by mouth 3 (three) times daily. (Patient not taking: Reported on 11/28/2021) 30 tablet 0   meloxicam (MOBIC) 15 MG tablet      METFORMIN HCL ER PO Take by mouth.     metoprolol tartrate (LOPRESSOR) 50 MG tablet Take 50 mg by mouth 2 (two) times daily.     Multiple Vitamins-Minerals (PRESERVISION AREDS 2+MULTI VIT PO) Take by mouth.     Omega-3 Fatty Acids (FISH OIL) 1000 MG CAPS Take by mouth.     ondansetron (ZOFRAN) 8 MG tablet Take 1 tablet (8 mg total) by mouth every 8 (eight) hours as needed for refractory nausea / vomiting. 15 tablet 0   ramipril (ALTACE) 10 MG capsule Take 10 mg by mouth 2 (two) times daily.     No current facility-administered medications for this visit.   Facility-Administered Medications Ordered in Other Visits  Medication Dose Route Frequency Provider Last Rate Last Admin   ketorolac (TORADOL) 15 MG/ML injection 15 mg  15 mg Intravenous Once Autumn Messing III, MD        PHYSICAL EXAMINATION: ECOG PERFORMANCE STATUS: 0 - Asymptomatic  Vitals:   05/28/22 1214  BP: (!) 115/54  Pulse: 72  Resp: 15  Temp: 98 F (36.7 C)  SpO2: 96%   Filed Weights   05/28/22 1214  Weight: 147 lb 1.6 oz (66.7 kg)     GENERAL:alert, no distress and comfortable SKIN:  skin color normal, no rashes or significant lesions EYES: normal, Conjunctiva are pink and non-injected, sclera clear  NEURO: alert & oriented x 3 with fluent speech NECK:(-)  supple, thyroid normal size, non-tender, without nodularity LYMPH: (-)  no palpable lymphadenopathy in the cervical, axillary  LUNGS:(-)  clear to auscultation and percussion with normal breathing effort HEART: regular rate & rhythm and no murmurs and no lower extremity edema ABDOMEN:(-) abdomen soft, non-tender and normal bowel sounds Musculoskeletal:no cyanosis of digits and no clubbing  NEURO: alert & oriented x 3 with fluent speech, no focal motor/sensory deficits BREAST:  RT breast bruise from biopsy 3 cm   Lt Breast  a little firm No palpable mass, nodules or adenopathy bilaterally. Breast exam benign.  LABORATORY DATA:  I have reviewed the data as listed    Latest Ref Rng & Units 05/28/2022   11:49 AM 12/24/2017    4:34 AM 12/23/2017    5:34 AM  CBC  WBC 4.0 - 10.5 K/uL 7.1  5.4  5.5   Hemoglobin 12.0 - 15.0 g/dL 17.1  14.7  14.7   Hematocrit 36.0 - 46.0 % 48.3  43.2  43.9   Platelets 150 - 400 K/uL 195  194  186        Latest Ref Rng & Units 05/28/2022   11:49 AM 07/19/2018   12:22 PM 12/24/2017    4:34 AM  CMP  Glucose 70 - 99 mg/dL 136  196  152   BUN 8 - 23 mg/dL '15  9  9   '$ Creatinine 0.44 - 1.00 mg/dL 0.80  0.79  0.77   Sodium 135 - 145 mmol/L 139  134  139   Potassium 3.5 - 5.1 mmol/L 3.4  3.7  3.3   Chloride 98 - 111 mmol/L 100  100  107   CO2 22 - 32 mmol/L 34  26  23   Calcium 8.9 - 10.3 mg/dL 10.0  9.3  8.7   Total Protein 6.5 - 8.1 g/dL 7.0   5.9   Total Bilirubin 0.3 - 1.2 mg/dL 1.0   1.2   Alkaline Phos 38 - 126 U/L 80   55   AST 15 - 41 U/L 48   42   ALT 0 - 44 U/L 72   72      RADIOGRAPHIC STUDIES: I have personally reviewed the radiological images as listed and agreed with the findings in the report. No results found.   Orders Placed This Encounter  Procedures   MR BREAST  BILATERAL W WO CONTRAST INC CAD    Extent of dz  [employer] [facility] EPIC ORDER NO COVID PF:04/2022 [facility] DX: Ductal carcinoma in situ (DCIS) of right breast WT:140 HT:5'2 NO NEEDS/NO CLAUS/ NO METAL REMOVED/ LENS IMPLANTS/ NO BULLETS OR BB'S/ NO GLUCOSE MONITOR,  STIMULATOR OR INJECTORS DEFIBRULATOR NO PORT NO BRAIN CLIP / NO PREV SX/ NO BRAIN HEART EYE OR EAR SX NKDA TO IV DYE CONTRAST DP AND PT 05-28-2022 ** PT AWARE OF 75$ NO SHOW FEE**    Standing Status:   Future    Standing Expiration Date:   05/29/2023    Order Specific Question:   If indicated for the ordered procedure, I authorize the administration of contrast media per Radiology protocol    Answer:   Yes    Order Specific Question:   What is the patient's sedation requirement?    Answer:   No Sedation    Order Specific Question:   Does the patient have a pacemaker or implanted devices?    Answer:   No    Order Specific Question:   Radiology Contrast Protocol - do NOT remove file path    Answer:   [email or web address]    Order Specific Question:   Preferred imaging location?    Answer:   [facility] (table limit-550lbs)    All questions were answered. The patient knows to call the clinic with any problems, questions or concerns. The total time spent in the appointment  was 45 minutes.     Truitt Merle, MD 05/28/2022 6:02 PM  I, Audry Riles, am acting as scribe for Truitt Merle, MD.   I have reviewed the above documentation for accuracy and completeness, and I agree with the above.

## 2022-05-27 NOTE — Progress Notes (Signed)
Radiation Oncology         [phone] ________________________________  Name: Meghan Mcdonald        MRN: [id number]  Date of Service: 05/28/2022 DOB: 1955/12/06  CC:Aura Dials, MD  Jovita Kussmaul, MD     REFERRING PHYSICIAN: Jovita Kussmaul, MD   DIAGNOSIS: The encounter diagnosis was Ductal carcinoma in situ (DCIS) of right breast.   HISTORY OF PRESENT ILLNESS: Meghan Mcdonald is a 67 y.o. female seen in the multidisciplinary breast clinic for a new diagnosis of right breast cancer. The patient was noted to have screening detected calcifications in the right breast in two aspects of the upper middle breast. The two groups were noted in the 1:00 access measuring 7 mm each.   A biopsy on 05/14/22 in the 12-1o'clock position of the right breast showed intermediate grade DCIS that was ER/PR positive. She returned on 05/21/22 for a biopsy in the central mid posterior right breast that also showed intermediate grade DCIS with calcifications that was ER/PR positive. She's seen today to discuss treatment recommendations of her cancer.   PREVIOUS RADIATION THERAPY: No   PAST MEDICAL HISTORY:  Past Medical History:  Diagnosis Date   Anxiety    Chronic shoulder pain    Depression    GERD (gastroesophageal reflux disease)    History of kidney stones    Hyperlipemia    Hypertension    Insomnia    Lichen sclerosus of vulva    Migraine    Overweight    Rapid palpitations    Ventricular tachycardia (paroxysmal) (HCC)        PAST SURGICAL HISTORY: Past Surgical History:  Procedure Laterality Date   BLADDER SURGERY     CARPAL TUNNEL RELEASE     CATARACT EXTRACTION Bilateral    CHOLECYSTECTOMY     ORIF ANKLE FRACTURE Right 07/22/2018   Procedure: OPEN REDUCTION INTERNAL FIXATION (ORIF) RIGHT BIMALL ANKLE FRACTURE;  Surgeon: Erle Crocker, MD;  Location: [facility];  Service: Orthopedics;  Laterality: Right;   ORIF TIBIA & FIBULA FRACTURES     TONSILLECTOMY        FAMILY HISTORY:  Family History  Problem Relation Age of Onset   Hypertension Mother    Hypertension Father      SOCIAL HISTORY:  reports that she has never smoked. She has never used smokeless tobacco. She reports current alcohol use. She reports that she does not use drugs. The patient is married and lives in [place]. 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    ALLERGIES: Amitriptyline, Aspirin, Micardis [telmisartan], Hibiclens [chlorhexidine gluconate], Loestrin [norethindrone acet-ethinyl est], and Prednisone   MEDICATIONS:  Current Outpatient Medications  Medication Sig Dispense Refill   acetaminophen (TYLENOL) 500 MG tablet Take 1,000 mg by mouth as needed for moderate pain.     aspirin EC 81 MG EC tablet Take 1 tablet (81 mg total) by mouth daily. (Patient not taking: Reported on 11/28/2021) 30 tablet 0   atorvastatin (LIPITOR) 80 MG tablet Take 1 tablet (80 mg total) by mouth daily at 6 PM. 30 tablet 0   diazepam (VALIUM) 5 MG tablet Take 1 tablet (5 mg total) by mouth every 8 (eight) hours as needed (nausea and dizziness). 15 tablet 0   empagliflozin (JARDIANCE) 10 MG TABS tablet Take 10 mg by mouth daily.     hydrochlorothiazide (HYDRODIURIL) 25 MG tablet Take 25 mg by mouth daily.     meclizine (ANTIVERT) 25 MG tablet Take  2 tablets (50 mg total) by mouth 3 (three) times daily. (Patient not taking: Reported on 11/28/2021) 30 tablet 0   meloxicam (MOBIC) 15 MG tablet      METFORMIN HCL ER PO Take by mouth.     metoprolol tartrate (LOPRESSOR) 50 MG tablet Take 50 mg by mouth 2 (two) times daily.     Multiple Vitamins-Minerals (PRESERVISION AREDS 2+MULTI VIT PO) Take by mouth.     Omega-3 Fatty Acids (FISH OIL) 1000 MG CAPS Take by mouth.     ondansetron (ZOFRAN) 8 MG tablet Take 1 tablet (8 mg total) by mouth every 8 (eight) hours as needed for refractory nausea / vomiting. 15 tablet 0   ramipril (ALTACE) 10 MG capsule Take 10 mg by mouth 2 (two) times  daily.     venlafaxine XR (EFFEXOR-XR) 75 MG 24 hr capsule Take 75 mg by mouth at bedtime.  (Patient not taking: Reported on 11/28/2021)     venlafaxine XR (EFFEXOR-XR) 75 MG 24 hr capsule Take by mouth. (Patient not taking: Reported on 11/28/2021)     No current facility-administered medications for this encounter.     REVIEW OF SYSTEMS: On review of systems, the patient reports that she is doing pretty well after hearing the recommendations of her treatment.      PHYSICAL EXAM:  Wt Readings from Last 3 Encounters:  05/28/22 147 lb 1.6 oz (66.7 kg)  07/22/18 175 lb 7.8 oz (79.6 kg)  07/10/18 175 lb (79.4 kg)   Temp Readings from Last 3 Encounters:  05/28/22 98 F (36.7 C) (Oral)  07/22/18 97.8 F (36.6 C)  07/10/18 98.2 F (36.8 C) (Oral)   BP Readings from Last 3 Encounters:  05/28/22 (!) 115/54  07/22/18 121/67  07/10/18 119/83   Pulse Readings from Last 3 Encounters:  05/28/22 72  07/22/18 72  07/10/18 69    In general this is a well appearing caucasian female in no acute distress. She's alert and oriented x4 and appropriate throughout the examination. Cardiopulmonary assessment is negative for acute distress and she exhibits normal effort. Bilateral breast exam is deferred.    ECOG = 1  0 - Asymptomatic (Fully active, able to carry on all predisease activities without restriction)  1 - Symptomatic but completely ambulatory (Restricted in physically strenuous activity but ambulatory and able to carry out work of a light or sedentary nature. For example, light housework, office work)  2 - Symptomatic, <50% in bed during the day (Ambulatory and capable of all self care but unable to carry out any work activities. Up and about more than 50% of waking hours)  3 - Symptomatic, >50% in bed, but not bedbound (Capable of only limited self-care, confined to bed or chair 50% or more of waking hours)  4 - Bedbound (Completely disabled. Cannot carry on any self-care. Totally  confined to bed or chair)  5 - Death   Eustace Pen MM, Creech RH, Tormey DC, et al. [phone]). "Toxicity and response criteria of the [facility]". [place] Oncol. 5 (6): 649-55    LABORATORY DATA:  Lab Results  Component Value Date   WBC 7.1 05/28/2022   HGB 17.1 (H) 05/28/2022   HCT 48.3 (H) 05/28/2022   MCV 88.5 05/28/2022   PLT 195 05/28/2022   Lab Results  Component Value Date   NA 139 05/28/2022   K 3.4 (L) 05/28/2022   CL 100 05/28/2022   CO2 34 (H) 05/28/2022   Lab Results  Component  Value Date   ALT 72 (H) 05/28/2022   AST 48 (H) 05/28/2022   ALKPHOS 80 05/28/2022   BILITOT 1.0 05/28/2022      RADIOGRAPHY: No results found.     IMPRESSION/PLAN: 1. Intermediate Grade, ER/PR positive DCIS of the right breast. Dr. Lisbeth Renshaw discusses the pathology findings and reviews the nature of noninvasive breast disease. The consensus from the breast conference includes MRI for extent of disease and breast conservation with lumpectomy. Dr. Lisbeth Renshaw would recommend external radiotherapy to the breast  to reduce risks of local recurrence followed by antiestrogen therapy. We discussed the risks, benefits, short, and long term effects of radiotherapy, as well as the curative intent, and the patient is interested in proceeding. Dr. Lisbeth Renshaw discusses the delivery and logistics of radiotherapy and anticipates a course of 4 weeks of radiotherapy to the right breast. We will see her back a few weeks after surgery to discuss the simulation process and anticipate we starting radiotherapy about 4-6 weeks after surgery.  2. Possible genetic predisposition to malignancy. The patient is eligible to meet with genetics to consider possible testing. She will meet with our geneticist today in clinic.   In a visit lasting 60 minutes, greater than 50% of the time was spent face to face reviewing her case, as well as in preparation of, discussing, and coordinating the patient's care.  The  above documentation reflects my direct findings during this shared patient visit. Please see the separate note by Dr. Lisbeth Renshaw on this date for the remainder of the patient's plan of care.    Carola Rhine, [facility]    **Disclaimer: This note was dictated with voice recognition software. Similar sounding words can inadvertently be transcribed and this note may contain transcription errors which may not have been corrected upon publication of note.**

## 2022-05-28 ENCOUNTER — Telehealth: Payer: Self-pay | Admitting: Genetic Counselor

## 2022-05-28 ENCOUNTER — Ambulatory Visit: Payer: Self-pay | Admitting: General Surgery

## 2022-05-28 ENCOUNTER — Ambulatory Visit: Payer: 59 | Admitting: Physical Therapy

## 2022-05-28 ENCOUNTER — Ambulatory Visit
Admission: RE | Admit: 2022-05-28 | Discharge: 2022-05-28 | Disposition: A | Discharge status: Home / Self Care | Payer: 59 | Source: Ambulatory Visit | Attending: Radiation Oncology | Admitting: Radiation Oncology

## 2022-05-28 ENCOUNTER — Inpatient Hospital Stay: Payer: 59

## 2022-05-28 ENCOUNTER — Encounter: Payer: Self-pay | Admitting: Hematology

## 2022-05-28 ENCOUNTER — Encounter: Payer: Self-pay | Admitting: Radiation Oncology

## 2022-05-28 ENCOUNTER — Encounter: Payer: Self-pay | Admitting: General Practice

## 2022-05-28 ENCOUNTER — Other Ambulatory Visit: Payer: Self-pay

## 2022-05-28 ENCOUNTER — Inpatient Hospital Stay: Payer: 59 | Attending: Hematology | Admitting: Hematology

## 2022-05-28 VITALS — BP 115/54 | HR 72 | Temp 98.0°F | Resp 15 | Wt 147.1 lb

## 2022-05-28 DIAGNOSIS — E785 Hyperlipidemia, unspecified: Secondary | ICD-10-CM | POA: Insufficient documentation

## 2022-05-28 DIAGNOSIS — Z79810 Long term (current) use of selective estrogen receptor modulators (SERMs): Secondary | ICD-10-CM | POA: Insufficient documentation

## 2022-05-28 DIAGNOSIS — M25519 Pain in unspecified shoulder: Secondary | ICD-10-CM | POA: Diagnosis not present

## 2022-05-28 DIAGNOSIS — Z79899 Other long term (current) drug therapy: Secondary | ICD-10-CM | POA: Insufficient documentation

## 2022-05-28 DIAGNOSIS — Z7984 Long term (current) use of oral hypoglycemic drugs: Secondary | ICD-10-CM | POA: Insufficient documentation

## 2022-05-28 DIAGNOSIS — I10 Essential (primary) hypertension: Secondary | ICD-10-CM | POA: Insufficient documentation

## 2022-05-28 DIAGNOSIS — D0511 Intraductal carcinoma in situ of right breast: Secondary | ICD-10-CM

## 2022-05-28 DIAGNOSIS — Z170 Estrogen receptor positive status [ER+]: Secondary | ICD-10-CM | POA: Diagnosis not present

## 2022-05-28 DIAGNOSIS — G4700 Insomnia, unspecified: Secondary | ICD-10-CM | POA: Diagnosis not present

## 2022-05-28 DIAGNOSIS — K219 Gastro-esophageal reflux disease without esophagitis: Secondary | ICD-10-CM | POA: Diagnosis not present

## 2022-05-28 DIAGNOSIS — G8929 Other chronic pain: Secondary | ICD-10-CM | POA: Insufficient documentation

## 2022-05-28 DIAGNOSIS — Z87442 Personal history of urinary calculi: Secondary | ICD-10-CM | POA: Diagnosis not present

## 2022-05-28 DIAGNOSIS — Z7982 Long term (current) use of aspirin: Secondary | ICD-10-CM | POA: Insufficient documentation

## 2022-05-28 HISTORY — DX: Ventricular tachycardia, unspecified: I47.20

## 2022-05-28 HISTORY — DX: Anxiety disorder, unspecified: F41.9

## 2022-05-28 HISTORY — DX: Leukoplakia of vulva: N90.4

## 2022-05-28 HISTORY — DX: Other ventricular tachycardia: I47.29

## 2022-05-28 LAB — CMP (CANCER CENTER ONLY)
ALT: 72 U/L — ABNORMAL HIGH (ref 0–44)
AST: 48 U/L — ABNORMAL HIGH (ref 15–41)
Albumin: 4.2 g/dL (ref 3.5–5.0)
Alkaline Phosphatase: 80 U/L (ref 38–126)
Anion gap: 5 (ref 5–15)
BUN: 15 mg/dL (ref 8–23)
CO2: 34 mmol/L — ABNORMAL HIGH (ref 22–32)
Calcium: 10 mg/dL (ref 8.9–10.3)
Chloride: 100 mmol/L (ref 98–111)
Creatinine: 0.8 mg/dL (ref 0.44–1.00)
GFR, Estimated: >60 mL/min (ref >60)
Glucose, Bld: 136 mg/dL — ABNORMAL HIGH (ref 70–99)
Potassium: 3.4 mmol/L — ABNORMAL LOW (ref 3.5–5.1)
Sodium: 139 mmol/L (ref 135–145)
Total Bilirubin: 1 mg/dL (ref 0.3–1.2)
Total Protein: 7 g/dL (ref 6.5–8.1)

## 2022-05-28 LAB — CBC WITH DIFFERENTIAL (CANCER CENTER ONLY)
Abs Immature Granulocytes: 0.02 10*3/uL (ref 0.00–0.07)
Basophils Absolute: 0.1 10*3/uL (ref 0.0–0.1)
Basophils Relative: 1 %
Eosinophils Absolute: 0.3 10*3/uL (ref 0.0–0.5)
Eosinophils Relative: 4 %
HCT: 48.3 % — ABNORMAL HIGH (ref 36.0–46.0)
Hemoglobin: 17.1 g/dL — ABNORMAL HIGH (ref 12.0–15.0)
Immature Granulocytes: 0 %
Lymphocytes Relative: 34 %
Lymphs Abs: 2.4 10*3/uL (ref 0.7–4.0)
MCH: 31.3 pg (ref 26.0–34.0)
MCHC: 35.4 g/dL (ref 30.0–36.0)
MCV: 88.5 fL (ref 80.0–100.0)
Monocytes Absolute: 0.7 10*3/uL (ref 0.1–1.0)
Monocytes Relative: 10 %
Neutro Abs: 3.6 10*3/uL (ref 1.7–7.7)
Neutrophils Relative %: 51 %
Platelet Count: 195 10*3/uL (ref 150–400)
RBC: 5.46 MIL/uL — ABNORMAL HIGH (ref 3.87–5.11)
RDW: 14 % (ref 11.5–15.5)
WBC Count: 7.1 10*3/uL (ref 4.0–10.5)
nRBC: 0 % (ref 0.0–0.2)

## 2022-05-28 LAB — GENETIC SCREENING ORDER

## 2022-05-28 MED ORDER — KETOROLAC TROMETHAMINE 15 MG/ML IJ SOLN: 15.0000 mg | Freq: Once | INTRAMUSCULAR | Status: AC

## 2022-05-28 NOTE — Telephone Encounter (Signed)
Meghan Mcdonald was seen by a genetic counselor during the breast multidisciplinary clinic on 05/28/2022. She reported no known family history of cancer. She does not meet NCCN criteria for genetic testing at this time. She was still offered genetic counseling and testing but declined. We encourage her to contact us if there are any changes to her personal or family history of cancer. If she meets NCCN criteria based on the updated personal/family history, she would be recommended to have genetic counseling and testing.   [name], MS, [facility] Genetic Counselor [name] (P) [phone]

## 2022-05-28 NOTE — Progress Notes (Signed)
[place] Psychosocial Distress Screening Spiritual Care  Met with Meghan Mcdonald and her husband Meghan Mcdonald in [facility] to introduce [facility] team/resources, reviewing distress screen per protocol.  The patient scored a 1 on the Psychosocial Distress Thermometer which indicates mild distress. Also assessed for distress and other psychosocial needs.      05/28/2022    3:56 PM  ONCBCN DISTRESS SCREENING  Screening Type Initial Screening  Distress experienced in past week (1-10) 1  Emotional problem type Nervousness/Anxiety;Adjusting to illness  Information Concerns Type Lack of info about diagnosis;Lack of info about treatment;Lack of info about complementary therapy choices  Referral to support programs Yes    Chaplain and patient discussed common feelings and emotions when being diagnosed with cancer, and the importance of support during treatment.  Chaplain informed patient of the support team and support services at [facility].  Chaplain provided contact information and encouraged patient to call with any questions or concerns.  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 Her husband has a Designer, industrial/product.   Placed an Pharmacist, hospital referral per her request.  Follow up needed: Yes.  [name] welcomes a follow-up call in two weeks.   [place], [place], [facility] Pager [phone] Voicemail [phone]

## 2022-05-30 ENCOUNTER — Encounter: Payer: Self-pay | Admitting: General Surgery

## 2022-06-02 ENCOUNTER — Other Ambulatory Visit: Payer: No Typology Code available for payment source

## 2022-06-04 ENCOUNTER — Other Ambulatory Visit: Payer: Self-pay

## 2022-06-04 ENCOUNTER — Encounter (HOSPITAL_BASED_OUTPATIENT_CLINIC_OR_DEPARTMENT_OTHER): Payer: Self-pay | Admitting: General Surgery

## 2022-06-04 DIAGNOSIS — C50911 Malignant neoplasm of unspecified site of right female breast: Secondary | ICD-10-CM | POA: Diagnosis not present

## 2022-06-05 ENCOUNTER — Encounter: Payer: Self-pay | Admitting: *Deleted

## 2022-06-05 DIAGNOSIS — D0511 Intraductal carcinoma in situ of right breast: Secondary | ICD-10-CM

## 2022-06-09 ENCOUNTER — Ambulatory Visit
Admission: RE | Admit: 2022-06-09 | Discharge: 2022-06-09 | Disposition: A | Discharge status: Home / Self Care | Payer: 59 | Source: Ambulatory Visit | Attending: General Surgery | Admitting: General Surgery

## 2022-06-09 ENCOUNTER — Encounter (HOSPITAL_BASED_OUTPATIENT_CLINIC_OR_DEPARTMENT_OTHER)
Admission: RE | Admit: 2022-06-09 | Discharge: 2022-06-09 | Disposition: A | Discharge status: Home / Self Care | Payer: 59 | Source: Ambulatory Visit | Attending: General Surgery | Admitting: General Surgery

## 2022-06-09 DIAGNOSIS — I10 Essential (primary) hypertension: Secondary | ICD-10-CM | POA: Diagnosis not present

## 2022-06-09 DIAGNOSIS — Z01810 Encounter for preprocedural cardiovascular examination: Secondary | ICD-10-CM | POA: Insufficient documentation

## 2022-06-09 DIAGNOSIS — D0511 Intraductal carcinoma in situ of right breast: Secondary | ICD-10-CM

## 2022-06-09 DIAGNOSIS — N6311 Unspecified lump in the right breast, upper outer quadrant: Secondary | ICD-10-CM | POA: Diagnosis not present

## 2022-06-09 MED ORDER — GADOPICLENOL 0.5 MMOL/ML IV SOLN
7.0000 mL | Freq: Once | INTRAVENOUS | Status: AC | PRN
  Administered 2022-06-09: 7 mL via INTRAVENOUS

## 2022-06-09 NOTE — Progress Notes (Signed)

## 2022-06-10 ENCOUNTER — Encounter: Payer: Self-pay | Admitting: *Deleted

## 2022-06-10 ENCOUNTER — Other Ambulatory Visit: Payer: Self-pay | Admitting: General Surgery

## 2022-06-10 DIAGNOSIS — R928 Other abnormal and inconclusive findings on diagnostic imaging of breast: Secondary | ICD-10-CM

## 2022-06-11 ENCOUNTER — Ambulatory Visit (HOSPITAL_BASED_OUTPATIENT_CLINIC_OR_DEPARTMENT_OTHER): Admission: RE | Admit: 2022-06-11 | Payer: 59 | Source: Home / Self Care | Admitting: General Surgery

## 2022-06-11 ENCOUNTER — Ambulatory Visit
Admission: RE | Admit: 2022-06-11 | Discharge: 2022-06-11 | Disposition: A | Discharge status: Home / Self Care | Payer: 59 | Source: Ambulatory Visit | Attending: General Surgery | Admitting: General Surgery

## 2022-06-11 DIAGNOSIS — R928 Other abnormal and inconclusive findings on diagnostic imaging of breast: Secondary | ICD-10-CM

## 2022-06-11 DIAGNOSIS — I10 Essential (primary) hypertension: Secondary | ICD-10-CM

## 2022-06-11 DIAGNOSIS — N6311 Unspecified lump in the right breast, upper outer quadrant: Secondary | ICD-10-CM

## 2022-06-11 DIAGNOSIS — C50411 Malignant neoplasm of upper-outer quadrant of right female breast: Secondary | ICD-10-CM | POA: Diagnosis not present

## 2022-06-11 DIAGNOSIS — E119 Type 2 diabetes mellitus without complications: Secondary | ICD-10-CM

## 2022-06-11 SURGERY — BREAST LUMPECTOMY WITH RADIOACTIVE SEED LOCALIZATION
Anesthesia: General | Site: Breast | Laterality: Right

## 2022-06-11 MED ORDER — GADOPICLENOL 0.5 MMOL/ML IV SOLN
7.0000 mL | Freq: Once | INTRAVENOUS | Status: AC | PRN
  Administered 2022-06-11: 7 mL via INTRAVENOUS

## 2022-06-12 ENCOUNTER — Ambulatory Visit: Payer: Self-pay | Admitting: General Surgery

## 2022-06-12 ENCOUNTER — Encounter: Payer: Self-pay | Admitting: General Practice

## 2022-06-12 DIAGNOSIS — C50411 Malignant neoplasm of upper-outer quadrant of right female breast: Secondary | ICD-10-CM

## 2022-06-12 MED ORDER — KETOROLAC TROMETHAMINE 15 MG/ML IJ SOLN: 15.0000 mg | Freq: Once | INTRAMUSCULAR | Status: AC

## 2022-06-12 NOTE — Progress Notes (Signed)
[facility] [facility] Spiritual Care Note  Followed up with Meghan Mcdonald by phone. She is using her tools (faith, friends' support, perspective, hobbies like embroidery) to cope, particularly with the challenge of the waiting cycle after a third biopsy. She is eager to learn and activate her updated care plan so that she can be on the other side of surgery. She also has received a call from her Pharmacist, hospital and anticipates another in a few weeks.  We plan to follow up by phone in ca two weeks for another Spiritual Care check-in.   Meghan Mcdonald, [place], [facility] Pager [phone] Voicemail [phone]

## 2022-06-13 ENCOUNTER — Encounter: Payer: Self-pay | Admitting: *Deleted

## 2022-06-19 ENCOUNTER — Other Ambulatory Visit: Payer: 59

## 2022-06-20 ENCOUNTER — Encounter: Payer: Self-pay | Admitting: Plastic Surgery

## 2022-06-20 ENCOUNTER — Ambulatory Visit: Payer: 59 | Admitting: Plastic Surgery

## 2022-06-20 VITALS — BP 117/73 | HR 67 | Ht 62.0 in | Wt 140.0 lb

## 2022-06-20 DIAGNOSIS — D0511 Intraductal carcinoma in situ of right breast: Secondary | ICD-10-CM

## 2022-06-20 DIAGNOSIS — E785 Hyperlipidemia, unspecified: Secondary | ICD-10-CM

## 2022-06-20 DIAGNOSIS — I639 Cerebral infarction, unspecified: Secondary | ICD-10-CM

## 2022-06-20 NOTE — Progress Notes (Signed)
Patient ID: Meghan Mcdonald, female    DOB: [birth year], 67 y.o.   MRN: [id number]   Chief Complaint  Patient presents with   Consult   Breast Cancer    The patient is a 67 year old female here for consultation for breast reconstruction.  She was recently diagnosed with a right breast cancer after routine screening mammogram.  She had 2 areas of calcification in the central and medial right breast that were approximately 7 mm in size.  They were 3 cm apart.  The biopsy came back as ductal carcinoma in situ.  It is estrogen and progesterone positive.  Her past medical history is positive for diabetes, hyperlipidemia, hypertension, gastroesophageal reflux and right breast cancer.  Patient denies history of CVA.  Past surgical history includes a cholecystectomy, carpal tunnel surgery and a right ankle ORIF.  She is 5 feet 2 inches tall and weighs 140 pounds.  Her preoperative bra size is C.  She is planning on undergoing a right mastectomy.  She inquired about nipple sparing.    Review of Systems  Constitutional: Negative.   HENT: Negative.    Eyes: Negative.   Respiratory: Negative.  Negative for chest tightness and shortness of breath.   Cardiovascular: Negative.   Gastrointestinal: Negative.   Endocrine: Negative.   Genitourinary: Negative.   Musculoskeletal: Negative.     Past Medical History:  Diagnosis Date   Anxiety    Breast cancer (HCC)    Chronic shoulder pain    Depression    GERD (gastroesophageal reflux disease)    History of kidney stones    Hyperlipemia    Hypertension    Insomnia    Lichen sclerosus of vulva    Migraine    Overweight    Rapid palpitations    Ventricular tachycardia (paroxysmal) ([place])     Past Surgical History:  Procedure Laterality Date   BLADDER SURGERY     CARPAL TUNNEL RELEASE     CATARACT EXTRACTION Bilateral    CHOLECYSTECTOMY     ORIF ANKLE FRACTURE Right 07/22/2018   Procedure: OPEN REDUCTION INTERNAL FIXATION (ORIF) RIGHT BIMALL  ANKLE FRACTURE;  Surgeon: Erle Crocker, MD;  Location: [facility];  Service: Orthopedics;  Laterality: Right;   ORIF TIBIA & FIBULA FRACTURES     TONSILLECTOMY        Current Outpatient Medications:    acetaminophen (TYLENOL) 500 MG tablet, Take 1,000 mg by mouth as needed for moderate pain., Disp: , Rfl:    atorvastatin (LIPITOR) 80 MG tablet, Take 1 tablet (80 mg total) by mouth daily at 6 PM., Disp: 30 tablet, Rfl: 0   diazepam (VALIUM) 5 MG tablet, Take 1 tablet (5 mg total) by mouth every 8 (eight) hours as needed (nausea and dizziness)., Disp: 15 tablet, Rfl: 0   empagliflozin (JARDIANCE) 10 MG TABS tablet, Take 10 mg by mouth daily., Disp: , Rfl:    hydrochlorothiazide (HYDRODIURIL) 25 MG tablet, Take 25 mg by mouth daily., Disp: , Rfl:    meclizine (ANTIVERT) 25 MG tablet, Take 2 tablets (50 mg total) by mouth 3 (three) times daily., Disp: 30 tablet, Rfl: 0   meloxicam (MOBIC) 15 MG tablet, , Disp: , Rfl:    METFORMIN HCL ER PO, Take by mouth., Disp: , Rfl:    metoprolol tartrate (LOPRESSOR) 50 MG tablet, Take 50 mg by mouth 2 (two) times daily., Disp: , Rfl:    Multiple Vitamins-Minerals (PRESERVISION AREDS 2+MULTI VIT PO), Take by mouth., Disp: ,  Rfl:    Omega-3 Fatty Acids (FISH OIL) 1000 MG CAPS, Take by mouth., Disp: , Rfl:    ondansetron (ZOFRAN) 8 MG tablet, Take 1 tablet (8 mg total) by mouth every 8 (eight) hours as needed for refractory nausea / vomiting., Disp: 15 tablet, Rfl: 0   ramipril (ALTACE) 10 MG capsule, Take 10 mg by mouth 2 (two) times daily., Disp: , Rfl:    Objective:   Vitals:   06/20/22 0814  BP: 117/73  Pulse: 67  SpO2: 92%    Physical Exam Vitals and nursing note reviewed.  Constitutional:      Appearance: Normal appearance.  HENT:     Head: Normocephalic and atraumatic.  Cardiovascular:     Rate and Rhythm: Normal rate.     Pulses: Normal pulses.  Pulmonary:     Effort: Pulmonary effort is normal.  Abdominal:      General: There is no distension.     Palpations: Abdomen is soft.  Musculoskeletal:        General: No swelling or deformity.  Skin:    General: Skin is warm.     Capillary Refill: Capillary refill takes less than 2 seconds.     Coloration: Skin is not jaundiced.     Findings: No bruising.  Neurological:     Mental Status: She is alert and oriented to person, place, and time.  Psychiatric:        Mood and Affect: Mood normal.        Behavior: Behavior normal.        Thought Content: Thought content normal.        Judgment: Judgment normal.     Assessment & Plan:  Ductal carcinoma in situ (DCIS) of right breast  Acute CVA (cerebrovascular accident) ([place])  Dyslipidemia  The options for reconstruction we explained to the patient / family for breast reconstruction.  There are two general categories of reconstruction.  We can reconstruction a breast with implants or use the patient's own tissue.  These were further discussed as listed.  Breast reconstruction is an optional procedure and eligibility depends on the full spectrum of the health of the patient and any co-morbidities.  More than one surgery is often needed to complete the reconstruction process.  The process can take three to twelve months to complete.  The breasts will not be identical due to many factors such as rib differences, shoulder asymmetry and treatments such as radiation.  The goal is to get the breasts to look normal and symmetrical in clothes.  Scars are a part of surgery and may fade some in time but will always be present under clothes.  Surgery may be an option on the non-cancer breast to achieve more symmetry.  No matter which procedure is chosen there is always the risk of complications and even failure of the body to heal.  This could result in no breast.    The options for reconstruction include:  1. Placement of a tissue expander with Acellular dermal matrix. When the expander is the desired size surgery is  performed to remove the expander and place an implant.  In some cases the implant can be placed without an expander.  2. Autologous reconstruction can include using a muscle or tissue from another area of the body to create a breast.  3. Combined procedures (ie. latissismus dorsi flap) can be done with an expander / implant placed under the muscle.   The risks, benefits, scars and recovery time were  discussed for each of the above. Risks include bleeding, infection, hematoma, seroma, scarring, pain, wound healing complications, flap loss, fat necrosis, capsular contracture, need for implant removal, donor site complications, bulge, hernia, umbilical necrosis, need for urgent reoperation, and need for dressing changes.   The procedure the patient selected / that was best for the patient, was then discussed in further detail.  Total time: 45 minutes. This includes time spent with the patient during the visit as well as time spent before and after the visit reviewing the chart, documenting the encounter, making phone calls and reviewing studies.   With the patient's past medical history a free flap is not a great idea.  She is in agreement for implant-based reconstruction with expander and then implant.  She would likely have a better cosmetic result if she does not try to keep the nipple so simple skin sparing mastectomy is recommended.  An additional surgery for fat grafting is a possibility.  Pictures were obtained of the patient and placed in the chart with the patient's or guardian's permission.   [place], DO

## 2022-06-25 ENCOUNTER — Telehealth: Payer: Self-pay | Admitting: *Deleted

## 2022-06-25 NOTE — Telephone Encounter (Signed)
Call received from Meghan Mcdonald who states she is scheduled for mastectomy with Dr. Marlou Starks on 07/31/2022 and he advised her to check with Dr. Marla Roe since [place] 4 [place] after her surgery. She is having immediate right breast reconstruction with placement of tissue expander and Flex HD. She is leaving 08/27/22. Please advise. CB# [phone]. Pt requests for call back after 1 pm if possible.

## 2022-06-26 ENCOUNTER — Encounter: Payer: Self-pay | Admitting: General Practice

## 2022-06-26 NOTE — Progress Notes (Signed)
[place] Spiritual Care Note  Reached Meghan Mcdonald by phone for follow-up support. She reports strong support and enjoyment of spring's unfolding as she sprouts seeds and works in her yard. We talked about the value of "meaningful distraction" in coping with worries and anticipation (in her case, of surgery next month).  Provided pastoral presence, reflective listening, normalization of feelings, and emotional support. We plan to follow up for another Spiritual Care check-in ca a week after surgery.   [place], [place], [facility] Pager [phone] Voicemail [phone]

## 2022-07-01 ENCOUNTER — Encounter: Payer: Self-pay | Admitting: *Deleted

## 2022-07-01 ENCOUNTER — Other Ambulatory Visit: Payer: Self-pay | Admitting: *Deleted

## 2022-07-01 DIAGNOSIS — D0511 Intraductal carcinoma in situ of right breast: Secondary | ICD-10-CM

## 2022-07-02 ENCOUNTER — Ambulatory Visit: Payer: 59 | Admitting: Radiation Oncology

## 2022-07-02 ENCOUNTER — Ambulatory Visit: Payer: 59

## 2022-07-03 ENCOUNTER — Ambulatory Visit (INDEPENDENT_AMBULATORY_CARE_PROVIDER_SITE_OTHER): Payer: 59 | Admitting: Physician Assistant

## 2022-07-03 DIAGNOSIS — D0511 Intraductal carcinoma in situ of right breast: Secondary | ICD-10-CM

## 2022-07-03 MED ORDER — OXYCODONE HCL 5 MG PO TABS: 5.0000 mg | ORAL_TABLET | Freq: Four times a day (QID) | ORAL | 0 refills | Status: AC | PRN

## 2022-07-03 MED ORDER — CEPHALEXIN 500 MG PO CAPS: 500.0000 mg | ORAL_CAPSULE | Freq: Four times a day (QID) | ORAL | 0 refills | Status: AC

## 2022-07-03 MED ORDER — ONDANSETRON 4 MG PO TBDP: 4.0000 mg | ORAL_TABLET | Freq: Three times a day (TID) | ORAL | 0 refills | Status: DC | PRN

## 2022-07-03 NOTE — Progress Notes (Signed)
Patient ID: Meghan Mcdonald, female    DOB: [birth year], 67 y.o.   MRN: [id number]  Chief Complaint  Patient presents with   Pre-op Exam      ICD-10-CM   1. Ductal carcinoma in situ (DCIS) of right breast  D05.11        History of Present Illness: Meghan Mcdonald is a 67 y.o.  female  with a history of right-sided breast cancer.  She presents for preoperative evaluation for upcoming procedure, right-sided mastectomy with lymph node biopsy and immediate reconstruction using tissue expander Flex HD, scheduled for 07/31/2022 with Dr. Marla Roe.  The patient has not had problems with anesthesia.  Previous surgeries without complication.  She denies smoking or using any nicotine-containing products.  She endorses 35 pound weight loss recently well on Jardiance and metformin for her type II DM.  A1c has improved to 6.7%.  She states that she only takes the meloxicam as needed and will make sure she avoids all NSAIDs 5 days prior to surgery.  She will also hold her multivitamins 7 days prior to surgery.  Personal history of breast cancer and family history of DVT in her sister.  Denies any personal history of blood clot, use of anticoagulation, or severe cardiac/pulmonary disease.  There was a history of CVA listed in her chart, but she states that that is inaccurate and that imaging was negative.  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  Summary of Previous Visit: Patient was seen for consult by Dr. Marla Roe on 06/20/2022.  Recently diagnosed with right-sided breast cancer after routine screening mammogram.  Preoperative bra size equals C cup.  Discussed options for reconstruction and decided to proceed with implant-based reconstruction.  Job: Retired Marine scientist.  PMH Significant for: Right-sided breast cancer, DM, HLD, HTN, GERD, Mnire's disease, previous cholecystectomy, right ankle ORIF, carpal tunnel surgery.   Past Medical History: Allergies: Allergies  Allergen Reactions    Amitriptyline Other (See Comments)    oversedation   Aspirin Other (See Comments)    Severe Blood Thinning   Micardis [Telmisartan] Other (See Comments)    unknown   Hibiclens [Chlorhexidine Gluconate] Rash    Skin blisters   Loestrin [Norethindrone Acet-Ethinyl Est] Rash   Prednisone Rash    Current Medications:  Current Outpatient Medications:    cephALEXin (KEFLEX) 500 MG capsule, Take 1 capsule (500 mg total) by mouth 4 (four) times daily for 5 days., Disp: 20 capsule, Rfl: 0   ondansetron (ZOFRAN-ODT) 4 MG disintegrating tablet, Take 1 tablet (4 mg total) by mouth every 8 (eight) hours as needed for nausea or vomiting., Disp: 20 tablet, Rfl: 0   oxyCODONE (ROXICODONE) 5 MG immediate release tablet, Take 1 tablet (5 mg total) by mouth every 6 (six) hours as needed for up to 5 days for severe pain., Disp: 20 tablet, Rfl: 0   acetaminophen (TYLENOL) 500 MG tablet, Take 1,000 mg by mouth as needed for moderate pain., Disp: , Rfl:    atorvastatin (LIPITOR) 80 MG tablet, Take 1 tablet (80 mg total) by mouth daily at 6 PM., Disp: 30 tablet, Rfl: 0   diazepam (VALIUM) 5 MG tablet, Take 1 tablet (5 mg total) by mouth every 8 (eight) hours as needed (nausea and dizziness)., Disp: 15 tablet, Rfl: 0   empagliflozin (JARDIANCE) 10 MG TABS tablet, Take 10 mg by mouth daily., Disp: , Rfl:    hydrochlorothiazide (HYDRODIURIL) 25 MG tablet, Take 25 mg by mouth daily., Disp: , Rfl:  meclizine (ANTIVERT) 25 MG tablet, Take 2 tablets (50 mg total) by mouth 3 (three) times daily., Disp: 30 tablet, Rfl: 0   meloxicam (MOBIC) 15 MG tablet, , Disp: , Rfl:    METFORMIN HCL ER PO, Take by mouth., Disp: , Rfl:    metoprolol tartrate (LOPRESSOR) 50 MG tablet, Take 50 mg by mouth 2 (two) times daily., Disp: , Rfl:    Multiple Vitamins-Minerals (PRESERVISION AREDS 2+MULTI VIT PO), Take by mouth., Disp: , Rfl:    Omega-3 Fatty Acids (FISH OIL) 1000 MG CAPS, Take by mouth., Disp: , Rfl:    ondansetron (ZOFRAN) 8  MG tablet, Take 1 tablet (8 mg total) by mouth every 8 (eight) hours as needed for refractory nausea / vomiting., Disp: 15 tablet, Rfl: 0   ramipril (ALTACE) 10 MG capsule, Take 10 mg by mouth 2 (two) times daily., Disp: , Rfl:   Past Medical Problems: Past Medical History:  Diagnosis Date   Anxiety    Breast cancer ([place])    Chronic shoulder pain    Depression    GERD (gastroesophageal reflux disease)    History of kidney stones    Hyperlipemia    Hypertension    Insomnia    Lichen sclerosus of vulva    Migraine    Overweight    Rapid palpitations    Ventricular tachycardia (paroxysmal) ([facility])     Past Surgical History: Past Surgical History:  Procedure Laterality Date   BLADDER SURGERY     CARPAL TUNNEL RELEASE     CATARACT EXTRACTION Bilateral    CHOLECYSTECTOMY     ORIF ANKLE FRACTURE Right 07/22/2018   Procedure: OPEN REDUCTION INTERNAL FIXATION (ORIF) RIGHT BIMALL ANKLE FRACTURE;  Surgeon: Erle Crocker, MD;  Location: [facility];  Service: Orthopedics;  Laterality: Right;   ORIF TIBIA & FIBULA FRACTURES     TONSILLECTOMY      Social History: Social History   Socioeconomic History   Marital status: Married    Spouse name: Not on file   Number of children: 2   Years of education: Not on file   Highest education level: Not on file  Occupational History   Not on file  Tobacco Use   Smoking status: Never   Smokeless tobacco: Never  Vaping Use   Vaping Use: Never used  Substance and Sexual Activity   Alcohol use: Yes    Alcohol/week: 3.0 standard drinks of alcohol    Types: 3 Glasses of wine per week   Drug use: Never   Sexual activity: Not on file  Other Topics Concern   Not on file  Social History Narrative      Lives with husband   Social Determinants of Health   Financial Resource Strain: Not on file  Food Insecurity: Not on file  Transportation Needs: Not on file  Physical Activity: Not on file  Stress: Not on file   Social Connections: Not on file  Intimate Partner Violence: Not on file    Family History: Family History  Problem Relation Age of Onset   Hypertension Mother    Hypertension Father     Review of Systems: ROS Denies recent chest pain, difficulty breathing, leg swelling, or fevers.  Physical Exam: Vital Signs There were no vitals taken for this visit.  Physical Exam Constitutional:      General: Not in acute distress.    Appearance: Normal appearance. Not ill-appearing.  HENT:     Head: Normocephalic and atraumatic.  Eyes:  Pupils: Pupils are equal, round. Cardiovascular:     Rate and Rhythm: Normal rate.    Pulses: Normal pulses.  Pulmonary:     Effort: No respiratory distress or increased work of breathing.  Speaks in full sentences. Abdominal:     General: Abdomen is flat. No distension.   Musculoskeletal: Normal range of motion. No lower extremity swelling or edema. No varicosities. Skin:    General: Skin is warm and dry.     Findings: No erythema or rash.  Neurological:     Mental Status: Alert and oriented to person, place, and time.  Psychiatric:        Mood and Affect: Mood normal.        Behavior: Behavior normal.    Assessment/Plan: The patient is scheduled for right sided mastectomy with immediate construction using tissue expander and flex HD with Dr. Marla Roe.  Risks, benefits, and alternatives of procedure discussed, questions answered and consent obtained.    Smoking Status: Non-smoker. Last Mammogram: 05/2022; Results: Biopsy-proven DCIS right breast.    Caprini Score: 10; Risk Factors include: Personal history of breast cancer, BMI greater than 25, family history of thrombosis in sister, and length of planned surgery. Recommendation for mechanical and possibly pharmacological prophylaxis.  Will discuss with surgeon.  Encourage early ambulation.   Pictures obtained: 06/20/2022.  Post-op Rx sent to pharmacy: Keflex, oxycodone,  Zofran.  Patient was provided with the General Surgical Risk consent document and Pain Medication Agreement prior to their appointment.  They had adequate time to read through the risk consent documents and Pain Medication Agreement. We also discussed them in person together during this preop appointment. All of their questions were answered to their satisfaction.  Recommended calling if they have any further questions.  Risk consent form and Pain Medication Agreement to be scanned into patient's chart.  The risks that can be encountered with and after placement of a breast expander placement were discussed and include the following but not limited to these: bleeding, infection, delayed healing, anesthesia risks, skin sensation changes, injury to structures including nerves, blood vessels, and muscles which may be temporary or permanent, allergies to tape, suture materials and glues, blood products, topical preparations or injected agents, skin contour irregularities, skin discoloration and swelling, deep vein thrombosis, cardiac and pulmonary complications, pain, which may persist, fluid accumulation, wrinkling of the skin over the expander, changes in nipple or breast sensation, expander leakage or rupture, faulty position of the expander, persistent pain, formation of tight scar tissue around the expander (capsular contracture), possible need for revisional surgery or staged procedures.     Electronically signed by: Krista Blue, PA-C 07/03/2022 3:22 PM

## 2022-07-08 ENCOUNTER — Ambulatory Visit: Payer: 59 | Attending: Hematology | Admitting: Physical Therapy

## 2022-07-08 ENCOUNTER — Encounter: Payer: Self-pay | Admitting: Physical Therapy

## 2022-07-08 ENCOUNTER — Other Ambulatory Visit: Payer: Self-pay

## 2022-07-08 DIAGNOSIS — D0511 Intraductal carcinoma in situ of right breast: Secondary | ICD-10-CM

## 2022-07-08 DIAGNOSIS — R293 Abnormal posture: Secondary | ICD-10-CM | POA: Diagnosis not present

## 2022-07-08 NOTE — Therapy (Signed)
OUTPATIENT PHYSICAL THERAPY BREAST CANCER BASELINE EVALUATION   Patient Name: Meghan Mcdonald MRN: [id number] DOB:[birth year], 67 y.o., female Today's Date: 07/08/2022  END OF SESSION:  PT End of Session - 07/08/22 1000     Visit Number 1    Number of Visits 3    Date for PT Re-Evaluation 09/02/22    PT Start Time 0907    PT Stop Time 0957    PT Time Calculation (min) 50 min    Activity Tolerance Patient tolerated treatment well    Behavior During Therapy WFL for tasks assessed/performed             Past Medical History:  Diagnosis Date   Anxiety    Breast cancer ([place])    Chronic shoulder pain    Depression    GERD (gastroesophageal reflux disease)    History of kidney stones    Hyperlipemia    Hypertension    Insomnia    Lichen sclerosus of vulva    Migraine    Overweight    Rapid palpitations    Ventricular tachycardia (paroxysmal) ([place])    Past Surgical History:  Procedure Laterality Date   BLADDER SURGERY     CARPAL TUNNEL RELEASE     CATARACT EXTRACTION Bilateral    CHOLECYSTECTOMY     ORIF ANKLE FRACTURE Right 07/22/2018   Procedure: OPEN REDUCTION INTERNAL FIXATION (ORIF) RIGHT BIMALL ANKLE FRACTURE;  Surgeon: Erle Crocker, MD;  Location: [facility];  Service: Orthopedics;  Laterality: Right;   ORIF TIBIA & FIBULA FRACTURES     TONSILLECTOMY     Patient Active Problem List   Diagnosis Date Noted   Ductal carcinoma in situ (DCIS) of right breast 05/26/2022   Acute CVA (cerebrovascular accident) ([place]) 12/23/2017   Severe vertigo 12/22/2017   Vertigo 12/22/2017   Dyslipidemia 12/22/2017   Essential hypertension 12/22/2017   Nausea & vomiting 12/22/2017    PCP: Ammie Dalton PA-C  REFERRING PROVIDER: Truitt Merle, MD  REFERRING DIAG: D05.11 (ICD-10-CM) - Ductal carcinoma in situ (DCIS) of right breast   THERAPY DIAG:  Abnormal posture  Ductal carcinoma in situ (DCIS) of right breast  Rationale for Evaluation and Treatment:  Rehabilitation  ONSET DATE: 05/14/22  SUBJECTIVE:                                                                                                                                                                                           SUBJECTIVE STATEMENT: Patient reports she is here today to be seen by her medical team for her newly diagnosed right breast cancer.   PERTINENT HISTORY:  Patient was diagnosed  on 05/14/22. She had a total of 3 biopsies: 1 on 05/14/22: ER+ PR+ DCIS, 05/21/22: DCIS 2/5 mm ER/PR+, 06/11/22: DCIS grade 2, ER+, PR- Ki67: 20%, Hx of diabetes, hypertension  PATIENT GOALS:   reduce lymphedema risk and learn post op HEP.   PAIN:  Are you having pain? No  PRECAUTIONS: Active CA Other: R ankle plates and screws 2841  HAND DOMINANCE: right  WEIGHT BEARING RESTRICTIONS: No  FALLS:  Has patient fallen in last 6 months?  Pt reports "probably" she reports she falls a lot and this is normal for her, she trips over the dog, fell in a hole gardening, reports she gets up and is fine  LIVING ENVIRONMENT: Patient lives with: husband Lives in: House/apartment Has following equipment at home: Grab bars  OCCUPATION: retired  Psychologist, clinical: works outside a lot  PRIOR LEVEL OF FUNCTION: Independent   OBJECTIVE:  COGNITION: Overall cognitive status: Within functional limits for tasks assessed    POSTURE:  Forward head and rounded shoulders posture  UPPER EXTREMITY AROM/PROM:  A/PROM RIGHT   eval   Shoulder extension 66  Shoulder flexion 165  Shoulder abduction 179  Shoulder internal rotation 62  Shoulder external rotation 85    (Blank rows = not tested)  A/PROM LEFT   eval  Shoulder extension 66  Shoulder flexion 162  Shoulder abduction 180  Shoulder internal rotation 55  Shoulder external rotation 83    (Blank rows = not tested)  CERVICAL AROM: All within normal limits:    Percent limited  Flexion WFL  Extension WFL  Right lateral flexion WFL  Left  lateral flexion WFL  Right rotation WFL  Left rotation WFL    UPPER EXTREMITY STRENGTH: 4+/5  LYMPHEDEMA ASSESSMENTS:   LANDMARK RIGHT   eval  10 cm proximal to olecranon process 26.7  Olecranon process 24.5  10 cm proximal to ulnar styloid process 21.7  Just proximal to ulnar styloid process 15.2  Across hand at thumb web space 19.7  At base of 2nd digit 6.4  (Blank rows = not tested)  LANDMARK LEFT   eval  10 cm proximal to olecranon process 25.3  Olecranon process 24.3  10 cm proximal to ulnar styloid process 21.1  Just proximal to ulnar styloid process 15.4  Across hand at thumb web space 18.5  At base of 2nd digit 6.2  (Blank rows = not tested)  L-DEX LYMPHEDEMA SCREENING:  The patient was assessed using the L-Dex machine today to produce a lymphedema index baseline score. The patient will be reassessed on a regular basis (typically every 3 months) to obtain new L-Dex scores. If the score is > 6.5 points away from his/her baseline score indicating onset of subclinical lymphedema, it will be recommended to wear a compression garment for 4 weeks, 12 hours per day and then be reassessed. If the score continues to be > 6.5 points from baseline at reassessment, we will initiate lymphedema treatment. Assessing in this manner has a 95% rate of preventing clinically significant lymphedema.    QUICK DASH SURVEY:  Meghan Mcdonald - 07/08/22 0001     Open a tight or new jar Moderate difficulty    Do heavy household chores (wash walls, wash floors) No difficulty    Carry a shopping bag or briefcase No difficulty    Wash your back No difficulty    Use a knife to cut food No difficulty    Recreational activities in which you take some force or impact through your  arm, shoulder, or hand (golf, hammering, tennis) No difficulty    During the past week, to what extent has your arm, shoulder or hand problem interfered with your normal social activities with family, friends, neighbors, or  groups? Not at all    During the past week, to what extent has your arm, shoulder or hand problem limited your work or other regular daily activities Not at all    Arm, shoulder, or hand pain. None    Tingling (pins and needles) in your arm, shoulder, or hand None    Difficulty Sleeping No difficulty    DASH Score 4.55 %              PATIENT EDUCATION:  Education details: Lymphedema risk reduction and post op shoulder/posture HEP Person educated: Patient Education method: Explanation, Demonstration, Handout Education comprehension: Patient verbalized understanding and returned demonstration  HOME EXERCISE PROGRAM: Patient was instructed today in a home exercise program today for post op shoulder range of motion. These included active assist shoulder flexion in sitting, scapular retraction, wall walking with shoulder abduction, and hands behind head external rotation.  She was encouraged to do these twice a day, holding 3 seconds and repeating 5 times when permitted by her physician.   ASSESSMENT:  CLINICAL IMPRESSION: Pt reports to PT with recently diagnosed R breast cancer. She has had 3 different biopsies all of which have shown DCIS. She will be undergoing a R mastectomy and SLNB on 07/31/22. She plans on flying out of the country for vacation 3.5 weeks after surgery. Will schedule another appointment prior to her post op follow up to discuss lymphedema risk reduction practices and to measure her for a compression sleeve to wear for her flight. She will also benefit from L-Dex screens every 3 months for 2 years to detect subclinical lymphedema.  Pt will benefit from skilled therapeutic intervention to improve on the following deficits: Decreased knowledge of precautions, impaired UE functional use, pain, decreased ROM, postural dysfunction.   PT treatment/interventions: ADL/self-care home management, pt/family education, therapeutic exercise, orthotic fit  REHAB POTENTIAL:  Good  CLINICAL DECISION MAKING: Stable/uncomplicated  EVALUATION COMPLEXITY: Low   GOALS: Goals reviewed with patient? YES  LONG TERM GOALS: (STG=LTG)    Name Target Date Goal status  1 Pt will be able to verbalize understanding of pertinent lymphedema risk reduction practices relevant to her dx specifically related to skin care.  Baseline:  No knowledge 07/08/2022 Achieved at eval  2 Pt will be able to return demo and/or verbalize understanding of the post op HEP related to regaining shoulder ROM. Baseline:  No knowledge 07/08/2022 Achieved at eval  3 Pt will be able to verbalize understanding of the importance of attending the post op After Breast CA Class for further lymphedema risk reduction education and therapeutic exercise.  Baseline:  No knowledge 07/08/2022 Achieved at eval  4 Pt will demo she has regained full shoulder ROM and function post operatively compared to baselines.  Baseline: See objective measurements taken today. 09/02/22 NEW  5 Pt will be able to independently verbalize lymphedema risk reduction practices.  09/02/22 NEW    PLAN:  PT FREQUENCY/DURATION: EVAL and 1 follow up appointment to discuss lymphedema risk reduction and measure for compression sleeve and 1 post op follow up.   PLAN FOR NEXT SESSION: review ABC class packet and lymphedema risk reduction info, measure for sleeve and gauntlet for upcoming flight   Patient will follow up at outpatient cancer rehab 3-4 weeks following surgery.  If the patient requires physical therapy at that time, a specific plan will be dictated and sent to the referring physician for approval. The patient was educated today on appropriate basic range of motion exercises to begin post operatively and the importance of attending the After Breast Cancer class following surgery.  Patient was educated today on lymphedema risk reduction practices as it pertains to recommendations that will benefit the patient immediately following  surgery.  She verbalized good understanding.    Physical Therapy Information for After Breast Cancer Surgery/Treatment:  Lymphedema is a swelling condition that you may be at risk for in your arm if you have lymph nodes removed from the armpit area.  After a sentinel node biopsy, the risk is approximately 5-9% and is higher after an axillary node dissection.  There is treatment available for this condition and it is not life-threatening.  Contact your physician or physical therapist with concerns. You may begin the 4 shoulder/posture exercises (see additional sheet) when permitted by your physician (typically a week after surgery).  If you have drains, you may need to wait until those are removed before beginning range of motion exercises.  A general recommendation is to not lift your arms above shoulder height until drains are removed.  These exercises should be done to your tolerance and gently.  This is not a "no pain/no gain" type of recovery so listen to your body and stretch into the range of motion that you can tolerate, stopping if you have pain.  If you are having immediate reconstruction, ask your plastic surgeon about doing exercises as he or she may want you to wait. We encourage you to attend the free one time ABC (After Breast Cancer) class offered by [facility].  You will learn information related to lymphedema risk, prevention and treatment and additional exercises to regain mobility following surgery.  You can call [phone] for more information.  This is offered the 1st and 3rd Monday of each month.  You only attend the class one time. While undergoing any medical procedure or treatment, try to avoid blood pressure being taken or needle sticks from occurring on the arm on the side of cancer.   This recommendation begins after surgery and continues for the rest of your life.  This may help reduce your risk of getting lymphedema (swelling in your arm). An excellent  resource for those seeking information on lymphedema is the National Lymphedema Network's web site. It can be accessed at [facility] [address].org If you notice swelling in your hand, arm or breast at any time following surgery (even if it is many years from now), please contact your doctor or physical therapist to discuss this.  Lymphedema can be treated at any time but it is easier for you if it is treated early on.  If you feel like your shoulder motion is not returning to normal in a reasonable amount of time, please contact your surgeon or physical therapist.  [facility] [phone]. [address], [place] [address]  ABC CLASS After Breast Cancer Class  After Breast Cancer Class is a specially designed exercise class to assist you in a safe recover after having breast cancer surgery.  In this class you will learn how to get back to full function whether your drains were just removed or if you had surgery a month ago.  This one-time class is held the 1st and 3rd Monday of every month from 11:00 a.m. until  12:00 noon virtually.  This class is FREE and space is limited. For more information or to register for the next available class, call [phone].  Class Goals  Understand specific stretches to improve the flexibility of you chest and shoulder. Learn ways to safely strengthen your upper body and improve your posture. Understand the warning signs of infection and why you may be at risk for an arm infection. Learn about Lymphedema and prevention.  ** You do not attend this class until after surgery.  Drains must be removed to participate  Patient was instructed today in a home exercise program today for post op shoulder range of motion. These included active assist shoulder flexion in sitting, scapular retraction, wall walking with shoulder abduction, and hands behind head external rotation.  She was encouraged to do these twice a day, holding 3  seconds and repeating 5 times when permitted by her physician.    [employer], PT 07/08/2022, 10:08 AM

## 2022-07-11 ENCOUNTER — Inpatient Hospital Stay
Admission: RE | Admit: 2022-07-11 | Discharge: 2022-07-11 | Disposition: A | Discharge status: Home / Self Care | Payer: Self-pay | Source: Ambulatory Visit | Attending: Radiation Oncology | Admitting: Radiation Oncology

## 2022-07-11 ENCOUNTER — Telehealth: Payer: Self-pay | Admitting: *Deleted

## 2022-07-11 ENCOUNTER — Other Ambulatory Visit: Payer: Self-pay | Admitting: Radiation Oncology

## 2022-07-11 DIAGNOSIS — D0511 Intraductal carcinoma in situ of right breast: Secondary | ICD-10-CM

## 2022-07-11 NOTE — Telephone Encounter (Signed)
CPT [id number], [id number], [id number] approved [id number] 06/24/22 - 12/25/22

## 2022-07-15 ENCOUNTER — Ambulatory Visit: Payer: 59 | Admitting: Physical Therapy

## 2022-07-15 DIAGNOSIS — R293 Abnormal posture: Secondary | ICD-10-CM | POA: Diagnosis not present

## 2022-07-15 DIAGNOSIS — D0511 Intraductal carcinoma in situ of right breast: Secondary | ICD-10-CM

## 2022-07-15 NOTE — Therapy (Signed)
OUTPATIENT PHYSICAL THERAPY BREAST CANCER BASELINE TREATMENT   Patient Name: Meghan Mcdonald MRN: [id number] DOB:02-Aug-1955, 67 y.o., female Today's Date: 07/15/2022  END OF SESSION:  PT End of Session - 07/15/22 1245     Visit Number 2    Number of Visits 3    Date for PT Re-Evaluation 09/02/22    PT Start Time 1207    PT Stop Time 1240    PT Time Calculation (min) 33 min    Activity Tolerance Patient tolerated treatment well    Behavior During Therapy WFL for tasks assessed/performed              Past Medical History:  Diagnosis Date   Anxiety    Breast cancer ([place])    Chronic shoulder pain    Depression    GERD (gastroesophageal reflux disease)    History of kidney stones    Hyperlipemia    Hypertension    Insomnia    Lichen sclerosus of vulva    Migraine    Overweight    Rapid palpitations    Ventricular tachycardia (paroxysmal) ([place])    Past Surgical History:  Procedure Laterality Date   BLADDER SURGERY     CARPAL TUNNEL RELEASE     CATARACT EXTRACTION Bilateral    CHOLECYSTECTOMY     ORIF ANKLE FRACTURE Right 07/22/2018   Procedure: OPEN REDUCTION INTERNAL FIXATION (ORIF) RIGHT BIMALL ANKLE FRACTURE;  Surgeon: Erle Crocker, MD;  Location: [facility];  Service: Orthopedics;  Laterality: Right;   ORIF TIBIA & FIBULA FRACTURES     TONSILLECTOMY     Patient Active Problem List   Diagnosis Date Noted   Ductal carcinoma in situ (DCIS) of right breast 05/26/2022   Acute CVA (cerebrovascular accident) ([place]) 12/23/2017   Severe vertigo 12/22/2017   Vertigo 12/22/2017   Dyslipidemia 12/22/2017   Essential hypertension 12/22/2017   Nausea & vomiting 12/22/2017    PCP: Ammie Dalton PA-C  REFERRING PROVIDER: Truitt Merle, MD  REFERRING DIAG: D05.11 (ICD-10-CM) - Ductal carcinoma in situ (DCIS) of right breast   THERAPY DIAG:  Abnormal posture  Ductal carcinoma in situ (DCIS) of right breast  Rationale for Evaluation and  Treatment: Rehabilitation  ONSET DATE: 05/14/22  SUBJECTIVE:                                                                                                                                                                                           SUBJECTIVE STATEMENT: I am flying to [place] about 3.5 weeks after my surgery.  PERTINENT HISTORY:  Patient was diagnosed on 05/14/22. She had a total of 3 biopsies:  1 on 05/14/22: ER+ PR+ DCIS, 05/21/22: DCIS 2/5 mm ER/PR+, 06/11/22: DCIS grade 2, ER+, PR- Ki67: 20%, Hx of diabetes, hypertension  PATIENT GOALS:   reduce lymphedema risk and learn post op HEP.   PAIN:  Are you having pain? No  PRECAUTIONS: Active CA Other: R ankle plates and screws [id number]  HAND DOMINANCE: right  WEIGHT BEARING RESTRICTIONS: No  FALLS:  Has patient fallen in last 6 months?  Pt reports "probably" she reports she falls a lot and this is normal for her, she trips over the dog, fell in a hole gardening, reports she gets up and is fine  LIVING ENVIRONMENT: Patient lives with: husband Lives in: House/apartment Has following equipment at home: Grab bars  OCCUPATION: retired  Psychologist, clinical: works outside a lot  PRIOR LEVEL OF FUNCTION: Independent   OBJECTIVE:  COGNITION: Overall cognitive status: Within functional limits for tasks assessed    POSTURE:  Forward head and rounded shoulders posture  UPPER EXTREMITY AROM/PROM:  A/PROM RIGHT   eval   Shoulder extension 66  Shoulder flexion 165  Shoulder abduction 179  Shoulder internal rotation 62  Shoulder external rotation 85    (Blank rows = not tested)  A/PROM LEFT   eval  Shoulder extension 66  Shoulder flexion 162  Shoulder abduction 180  Shoulder internal rotation 55  Shoulder external rotation 83    (Blank rows = not tested)  CERVICAL AROM: All within normal limits:    Percent limited  Flexion WFL  Extension WFL  Right lateral flexion WFL  Left lateral flexion WFL  Right rotation WFL   Left rotation WFL    UPPER EXTREMITY STRENGTH: 4+/5  LYMPHEDEMA ASSESSMENTS:   LANDMARK RIGHT   eval  10 cm proximal to olecranon process 26.7  Olecranon process 24.5  10 cm proximal to ulnar styloid process 21.7  Just proximal to ulnar styloid process 15.2  Across hand at thumb web space 19.7  At base of 2nd digit 6.4  (Blank rows = not tested)  LANDMARK LEFT   eval  10 cm proximal to olecranon process 25.3  Olecranon process 24.3  10 cm proximal to ulnar styloid process 21.1  Just proximal to ulnar styloid process 15.4  Across hand at thumb web space 18.5  At base of 2nd digit 6.2  (Blank rows = not tested)  L-DEX LYMPHEDEMA SCREENING:  The patient was assessed using the L-Dex machine today to produce a lymphedema index baseline score. The patient will be reassessed on a regular basis (typically every 3 months) to obtain new L-Dex scores. If the score is > 6.5 points away from his/her baseline score indicating onset of subclinical lymphedema, it will be recommended to wear a compression garment for 4 weeks, 12 hours per day and then be reassessed. If the score continues to be > 6.5 points from baseline at reassessment, we will initiate lymphedema treatment. Assessing in this manner has a 95% rate of preventing clinically significant lymphedema.    QUICK DASH SURVEY:  TREATMENT PERFORMED: 07/15/22: measured pt for a prophylactic sleeve and gauntlet and issued info on how to obtain - Sigvaris Secure S1 and small gauntlet- educated pt if it is too short to order the S2 Discussed all info given at [facility] class including anatomy and physiology of the lymphatic system, lymphedema risk reduction, how to check for lymphedema, how the flight can impact swelling, taper schedule for compression for flight   PATIENT EDUCATION:  Education details: Lymphedema risk reduction, ABC class info,  how to wear compression sleeve for several days before trip, during flight and for several days  after, how to obtain compression garments Person educated: Patient Education method: Explanation, handout Education comprehension: Patient verbalized understanding   HOME EXERCISE PROGRAM: Patient was instructed today in a home exercise program today for post op shoulder range of motion. These included active assist shoulder flexion in sitting, scapular retraction, wall walking with shoulder abduction, and hands behind head external rotation.  She was encouraged to do these twice a day, holding 3 seconds and repeating 5 times when permitted by her physician.   ASSESSMENT:  CLINICAL IMPRESSION: Pt returns to PT to be educated in anatomy and physiology of the lymphatic system, ways to reduce risk of lymphedema and how to taper on/off compression for flight soon after her mastectomy. Measured pt for a prophylactic sleeve and gauntlet and issued info on how to obtain. Will see pt again post op right before her trip to [place].   Pt will benefit from skilled therapeutic intervention to improve on the following deficits: Decreased knowledge of precautions, impaired UE functional use, pain, decreased ROM, postural dysfunction.   PT treatment/interventions: ADL/self-care home management, pt/family education, therapeutic exercise, orthotic fit  REHAB POTENTIAL: Good  CLINICAL DECISION MAKING: Stable/uncomplicated  EVALUATION COMPLEXITY: Low   GOALS: Goals reviewed with patient? YES  LONG TERM GOALS: (STG=LTG)    Name Target Date Goal status  1 Pt will be able to verbalize understanding of pertinent lymphedema risk reduction practices relevant to her dx specifically related to skin care.  Baseline:  No knowledge 07/08/2022 Achieved at eval  2 Pt will be able to return demo and/or verbalize understanding of the post op HEP related to regaining shoulder ROM. Baseline:  No knowledge 07/08/2022 Achieved at eval  3 Pt will be able to verbalize understanding of the importance of attending the post  op After Breast CA Class for further lymphedema risk reduction education and therapeutic exercise.  Baseline:  No knowledge 07/08/2022 Achieved at eval  4 Pt will demo she has regained full shoulder ROM and function post operatively compared to baselines.  Baseline: See objective measurements taken today. 09/02/22 NEW  5 Pt will be able to independently verbalize lymphedema risk reduction practices.  09/02/22 NEW    PLAN:  PT FREQUENCY/DURATION: EVAL and 1 follow up appointment to discuss lymphedema risk reduction and measure for compression sleeve and 1 post op follow up.   PLAN FOR NEXT SESSION: reassess post op    Physical Therapy Information for After Breast Cancer Surgery/Treatment:  Lymphedema is a swelling condition that you may be at risk for in your arm if you have lymph nodes removed from the armpit area.  After a sentinel node biopsy, the risk is approximately 5-9% and is higher after an axillary node dissection.  There is treatment available for this condition and it is not life-threatening.  Contact your physician or physical therapist with concerns. You may begin the 4 shoulder/posture exercises (see additional sheet) when permitted by your physician (typically a week after surgery).  If you have drains, you may need to wait until those are removed before beginning range of motion exercises.  A general recommendation is to not lift your arms above shoulder height until drains are removed.  These exercises should be done to your tolerance and gently.  This is not a "no pain/no gain" type of recovery so listen to your body and stretch into the range of motion that you can tolerate, stopping if  you have pain.  If you are having immediate reconstruction, ask your plastic surgeon about doing exercises as he or she may want you to wait. We encourage you to attend the free one time ABC (After Breast Cancer) class offered by [facility].  You will learn information  related to lymphedema risk, prevention and treatment and additional exercises to regain mobility following surgery.  You can call [phone] for more information.  This is offered the 1st and 3rd Monday of each month.  You only attend the class one time. While undergoing any medical procedure or treatment, try to avoid blood pressure being taken or needle sticks from occurring on the arm on the side of cancer.   This recommendation begins after surgery and continues for the rest of your life.  This may help reduce your risk of getting lymphedema (swelling in your arm). An excellent resource for those seeking information on lymphedema is the National Lymphedema Network's web site. It can be accessed at [place].org If you notice swelling in your hand, arm or breast at any time following surgery (even if it is many years from now), please contact your doctor or physical therapist to discuss this.  Lymphedema can be treated at any time but it is easier for you if it is treated early on.  If you feel like your shoulder motion is not returning to normal in a reasonable amount of time, please contact your surgeon or physical therapist.  [facility] [phone]. [address], [place] [address]  ABC CLASS After Breast Cancer Class  After Breast Cancer Class is a specially designed exercise class to assist you in a safe recover after having breast cancer surgery.  In this class you will learn how to get back to full function whether your drains were just removed or if you had surgery a month ago.  This one-time class is held the 1st and 3rd Monday of every month from 11:00 a.m. until 12:00 noon virtually.  This class is FREE and space is limited. For more information or to register for the next available class, call [phone].  Class Goals  Understand specific stretches to improve the flexibility of you chest and shoulder. Learn ways to safely  strengthen your upper body and improve your posture. Understand the warning signs of infection and why you may be at risk for an arm infection. Learn about Lymphedema and prevention.  ** You do not attend this class until after surgery.  Drains must be removed to participate  Patient was instructed today in a home exercise program today for post op shoulder range of motion. These included active assist shoulder flexion in sitting, scapular retraction, wall walking with shoulder abduction, and hands behind head external rotation.  She was encouraged to do these twice a day, holding 3 seconds and repeating 5 times when permitted by her physician.    [facility], PT 07/15/2022, 12:49 PM

## 2022-07-22 DIAGNOSIS — E119 Type 2 diabetes mellitus without complications: Secondary | ICD-10-CM | POA: Diagnosis not present

## 2022-07-22 DIAGNOSIS — Z880 Allergy status to penicillin: Secondary | ICD-10-CM | POA: Diagnosis not present

## 2022-07-22 DIAGNOSIS — E785 Hyperlipidemia, unspecified: Secondary | ICD-10-CM | POA: Diagnosis not present

## 2022-07-22 DIAGNOSIS — I10 Essential (primary) hypertension: Secondary | ICD-10-CM | POA: Diagnosis not present

## 2022-07-22 DIAGNOSIS — Z8249 Family history of ischemic heart disease and other diseases of the circulatory system: Secondary | ICD-10-CM | POA: Diagnosis not present

## 2022-07-22 DIAGNOSIS — L900 Lichen sclerosus et atrophicus: Secondary | ICD-10-CM | POA: Diagnosis not present

## 2022-07-22 DIAGNOSIS — C50919 Malignant neoplasm of unspecified site of unspecified female breast: Secondary | ICD-10-CM | POA: Diagnosis not present

## 2022-07-22 DIAGNOSIS — Z825 Family history of asthma and other chronic lower respiratory diseases: Secondary | ICD-10-CM | POA: Diagnosis not present

## 2022-07-22 DIAGNOSIS — Z7984 Long term (current) use of oral hypoglycemic drugs: Secondary | ICD-10-CM | POA: Diagnosis not present

## 2022-07-22 DIAGNOSIS — Z833 Family history of diabetes mellitus: Secondary | ICD-10-CM | POA: Diagnosis not present

## 2022-07-22 DIAGNOSIS — I471 Supraventricular tachycardia, unspecified: Secondary | ICD-10-CM | POA: Diagnosis not present

## 2022-07-22 DIAGNOSIS — Z888 Allergy status to other drugs, medicaments and biological substances status: Secondary | ICD-10-CM | POA: Diagnosis not present

## 2022-07-25 ENCOUNTER — Other Ambulatory Visit: Payer: Self-pay

## 2022-07-25 ENCOUNTER — Encounter (HOSPITAL_BASED_OUTPATIENT_CLINIC_OR_DEPARTMENT_OTHER): Payer: Self-pay | Admitting: General Surgery

## 2022-07-28 ENCOUNTER — Encounter (HOSPITAL_BASED_OUTPATIENT_CLINIC_OR_DEPARTMENT_OTHER)
Admission: RE | Admit: 2022-07-28 | Discharge: 2022-07-28 | Disposition: A | Discharge status: Home / Self Care | Payer: 59 | Source: Ambulatory Visit | Attending: General Surgery | Admitting: General Surgery

## 2022-07-28 DIAGNOSIS — Z01818 Encounter for other preprocedural examination: Secondary | ICD-10-CM | POA: Diagnosis not present

## 2022-07-28 LAB — BASIC METABOLIC PANEL
Anion gap: 12 (ref 5–15)
BUN: 11 mg/dL (ref 8–23)
CO2: 25 mmol/L (ref 22–32)
Calcium: 9.3 mg/dL (ref 8.9–10.3)
Chloride: 100 mmol/L (ref 98–111)
Creatinine, Ser: 0.79 mg/dL (ref 0.44–1.00)
GFR, Estimated: >60 mL/min (ref >60)
Glucose, Bld: 102 mg/dL — ABNORMAL HIGH (ref 70–99)
Potassium: 3.4 mmol/L — ABNORMAL LOW (ref 3.5–5.1)
Sodium: 137 mmol/L (ref 135–145)

## 2022-07-28 NOTE — Progress Notes (Signed)

## 2022-07-31 ENCOUNTER — Encounter (HOSPITAL_BASED_OUTPATIENT_CLINIC_OR_DEPARTMENT_OTHER): Payer: Self-pay | Admitting: General Surgery

## 2022-07-31 ENCOUNTER — Other Ambulatory Visit: Payer: Self-pay

## 2022-07-31 ENCOUNTER — Ambulatory Visit (HOSPITAL_BASED_OUTPATIENT_CLINIC_OR_DEPARTMENT_OTHER): Payer: 59 | Admitting: Anesthesiology

## 2022-07-31 ENCOUNTER — Observation Stay (HOSPITAL_BASED_OUTPATIENT_CLINIC_OR_DEPARTMENT_OTHER)
Admission: RE | Admit: 2022-07-31 | Discharge: 2022-08-01 | Disposition: A | Discharge status: Home / Self Care | Payer: 59 | Attending: Plastic Surgery | Admitting: Plastic Surgery

## 2022-07-31 ENCOUNTER — Telehealth: Payer: Self-pay | Admitting: *Deleted

## 2022-07-31 ENCOUNTER — Encounter (HOSPITAL_BASED_OUTPATIENT_CLINIC_OR_DEPARTMENT_OTHER)
Admission: RE | Disposition: A | Discharge status: Home / Self Care | Payer: Self-pay | Source: Home / Self Care | Attending: Plastic Surgery

## 2022-07-31 ENCOUNTER — Ambulatory Visit (HOSPITAL_COMMUNITY): Admission: RE | Admit: 2022-07-31 | Payer: 59 | Source: Ambulatory Visit | Admitting: General Surgery

## 2022-07-31 ENCOUNTER — Observation Stay (HOSPITAL_COMMUNITY)
Admission: RE | Admit: 2022-07-31 | Discharge: 2022-07-31 | Disposition: A | Discharge status: Home / Self Care | Payer: 59 | Source: Home / Self Care | Attending: General Surgery | Admitting: General Surgery

## 2022-07-31 DIAGNOSIS — Z79899 Other long term (current) drug therapy: Secondary | ICD-10-CM | POA: Insufficient documentation

## 2022-07-31 DIAGNOSIS — C50911 Malignant neoplasm of unspecified site of right female breast: Secondary | ICD-10-CM | POA: Diagnosis not present

## 2022-07-31 DIAGNOSIS — Z7984 Long term (current) use of oral hypoglycemic drugs: Secondary | ICD-10-CM | POA: Diagnosis not present

## 2022-07-31 DIAGNOSIS — Z9889 Other specified postprocedural states: Secondary | ICD-10-CM

## 2022-07-31 DIAGNOSIS — Z421 Encounter for breast reconstruction following mastectomy: Secondary | ICD-10-CM | POA: Diagnosis not present

## 2022-07-31 DIAGNOSIS — I10 Essential (primary) hypertension: Secondary | ICD-10-CM | POA: Insufficient documentation

## 2022-07-31 DIAGNOSIS — E119 Type 2 diabetes mellitus without complications: Secondary | ICD-10-CM

## 2022-07-31 DIAGNOSIS — C50919 Malignant neoplasm of unspecified site of unspecified female breast: Secondary | ICD-10-CM | POA: Diagnosis present

## 2022-07-31 DIAGNOSIS — C50411 Malignant neoplasm of upper-outer quadrant of right female breast: Secondary | ICD-10-CM

## 2022-07-31 DIAGNOSIS — D0511 Intraductal carcinoma in situ of right breast: Secondary | ICD-10-CM | POA: Diagnosis not present

## 2022-07-31 DIAGNOSIS — C50111 Malignant neoplasm of central portion of right female breast: Secondary | ICD-10-CM | POA: Diagnosis not present

## 2022-07-31 HISTORY — DX: Type 2 diabetes mellitus without complications: E11.9

## 2022-07-31 HISTORY — PX: BREAST RECONSTRUCTION WITH PLACEMENT OF TISSUE EXPANDER AND FLEX HD (ACELLULAR HYDRATED DERMIS): SHX6295

## 2022-07-31 HISTORY — PX: MASTECTOMY W/ SENTINEL NODE BIOPSY: SHX2001

## 2022-07-31 LAB — GLUCOSE, CAPILLARY
Glucose-Capillary: 116 mg/dL — ABNORMAL HIGH (ref 70–99)
Glucose-Capillary: 135 mg/dL — ABNORMAL HIGH (ref 70–99)

## 2022-07-31 SURGERY — MASTECTOMY WITH SENTINEL LYMPH NODE BIOPSY
Anesthesia: General | Site: Breast | Laterality: Right

## 2022-07-31 MED ORDER — FENTANYL CITRATE (PF) 100 MCG/2ML IJ SOLN
INTRAMUSCULAR | Status: AC
  Filled 2022-07-31: qty 2

## 2022-07-31 MED ORDER — LIDOCAINE HCL (CARDIAC) PF 100 MG/5ML IV SOSY
PREFILLED_SYRINGE | INTRAVENOUS | Status: DC | PRN
  Administered 2022-07-31: 50 mg via INTRAVENOUS

## 2022-07-31 MED ORDER — METHOCARBAMOL 500 MG PO TABS
500.0000 mg | ORAL_TABLET | Freq: Three times a day (TID) | ORAL | Status: DC | PRN
  Administered 2022-08-01: 500 mg via ORAL
  Filled 2022-07-31: qty 1

## 2022-07-31 MED ORDER — TECHNETIUM TC 99M TILMANOCEPT KIT
1.0000 | PACK | Freq: Once | INTRAVENOUS | Status: AC | PRN
  Administered 2022-07-31: 1 via INTRADERMAL

## 2022-07-31 MED ORDER — SODIUM CHLORIDE 0.9 % IV SOLN
INTRAVENOUS | Status: AC
  Filled 2022-07-31: qty 10

## 2022-07-31 MED ORDER — OXYCODONE HCL 5 MG/5ML PO SOLN: 5.0000 mg | Freq: Once | ORAL | Status: DC | PRN

## 2022-07-31 MED ORDER — FENTANYL CITRATE (PF) 100 MCG/2ML IJ SOLN
100.0000 ug | Freq: Once | INTRAMUSCULAR | Status: AC
  Administered 2022-07-31: 50 ug via INTRAVENOUS

## 2022-07-31 MED ORDER — CEFAZOLIN SODIUM-DEXTROSE 2-4 GM/100ML-% IV SOLN: 2.0000 g | INTRAVENOUS | Status: DC

## 2022-07-31 MED ORDER — SENNA 8.6 MG PO TABS: 1.0000 | ORAL_TABLET | Freq: Two times a day (BID) | ORAL | Status: DC

## 2022-07-31 MED ORDER — ONDANSETRON 4 MG PO TBDP
4.0000 mg | ORAL_TABLET | Freq: Four times a day (QID) | ORAL | Status: DC | PRN
  Administered 2022-07-31: 4 mg via ORAL
  Filled 2022-07-31: qty 1

## 2022-07-31 MED ORDER — MIDAZOLAM HCL 2 MG/2ML IJ SOLN
2.0000 mg | Freq: Once | INTRAMUSCULAR | Status: AC
  Administered 2022-07-31: 1 mg via INTRAVENOUS

## 2022-07-31 MED ORDER — SODIUM CHLORIDE 0.9 % IV SOLN
INTRAVENOUS | Status: DC | PRN
  Administered 2022-07-31: 500 mL

## 2022-07-31 MED ORDER — DEXTROSE 5 % IV SOLN
0.5000 g | Freq: Three times a day (TID) | INTRAVENOUS | Status: DC
Start: 2022-07-31 — End: 2022-07-31

## 2022-07-31 MED ORDER — MORPHINE SULFATE (PF) 4 MG/ML IV SOLN: 2.0000 mg | INTRAVENOUS | Status: DC | PRN

## 2022-07-31 MED ORDER — MIDAZOLAM HCL 2 MG/2ML IJ SOLN
INTRAMUSCULAR | Status: AC
  Filled 2022-07-31: qty 2

## 2022-07-31 MED ORDER — OXYCODONE HCL 5 MG PO TABS
5.0000 mg | ORAL_TABLET | Freq: Once | ORAL | Status: DC | PRN
  Filled 2022-07-31: qty 1

## 2022-07-31 MED ORDER — CEFAZOLIN SODIUM-DEXTROSE 2-4 GM/100ML-% IV SOLN
INTRAVENOUS | Status: AC
  Filled 2022-07-31: qty 100

## 2022-07-31 MED ORDER — ONDANSETRON HCL 4 MG/2ML IJ SOLN: 4.0000 mg | Freq: Four times a day (QID) | INTRAMUSCULAR | Status: DC | PRN

## 2022-07-31 MED ORDER — POLYETHYLENE GLYCOL 3350 17 G PO PACK: 17.0000 g | PACK | Freq: Every day | ORAL | Status: DC | PRN

## 2022-07-31 MED ORDER — PROPOFOL 10 MG/ML IV BOLUS
INTRAVENOUS | Status: DC | PRN
  Administered 2022-07-31: 200 mg via INTRAVENOUS

## 2022-07-31 MED ORDER — CEFAZOLIN SODIUM-DEXTROSE 1-4 GM/50ML-% IV SOLN
1.0000 g | Freq: Two times a day (BID) | INTRAVENOUS | Status: DC
  Administered 2022-07-31: 1 g via INTRAVENOUS
  Filled 2022-07-31: qty 50

## 2022-07-31 MED ORDER — GABAPENTIN 100 MG PO CAPS
ORAL_CAPSULE | ORAL | Status: AC
  Filled 2022-07-31: qty 1

## 2022-07-31 MED ORDER — LACTATED RINGERS IV SOLN: INTRAVENOUS | Status: DC

## 2022-07-31 MED ORDER — CEFAZOLIN SODIUM-DEXTROSE 2-4 GM/100ML-% IV SOLN
2.0000 g | INTRAVENOUS | Status: AC
  Administered 2022-07-31: 2 g via INTRAVENOUS

## 2022-07-31 MED ORDER — KCL IN DEXTROSE-NACL 20-5-0.45 MEQ/L-%-% IV SOLN
INTRAVENOUS | Status: DC
  Filled 2022-07-31: qty 1000

## 2022-07-31 MED ORDER — FENTANYL CITRATE (PF) 100 MCG/2ML IJ SOLN
INTRAMUSCULAR | Status: DC | PRN
  Administered 2022-07-31 (×2): 25 ug via INTRAVENOUS

## 2022-07-31 MED ORDER — EPHEDRINE SULFATE (PRESSORS) 50 MG/ML IJ SOLN
INTRAMUSCULAR | Status: DC | PRN
  Administered 2022-07-31 (×2): 10 mg via INTRAVENOUS

## 2022-07-31 MED ORDER — GABAPENTIN 100 MG PO CAPS
100.0000 mg | ORAL_CAPSULE | ORAL | Status: AC
  Administered 2022-07-31: 100 mg via ORAL

## 2022-07-31 MED ORDER — CEFAZOLIN SODIUM-DEXTROSE 1-4 GM/50ML-% IV SOLN: 1.0000 g | Freq: Two times a day (BID) | INTRAVENOUS | Status: DC

## 2022-07-31 MED ORDER — FENTANYL CITRATE (PF) 100 MCG/2ML IJ SOLN
25.0000 ug | INTRAMUSCULAR | Status: DC | PRN
  Administered 2022-07-31 (×2): 50 ug via INTRAVENOUS

## 2022-07-31 MED ORDER — ACETAMINOPHEN 500 MG PO TABS
1000.0000 mg | ORAL_TABLET | ORAL | Status: AC
  Administered 2022-07-31: 1000 mg via ORAL

## 2022-07-31 MED ORDER — HYDROCODONE-ACETAMINOPHEN 5-325 MG PO TABS: 1.0000 | ORAL_TABLET | ORAL | Status: DC | PRN

## 2022-07-31 MED ORDER — MELATONIN 3 MG PO TABS
3.0000 mg | ORAL_TABLET | Freq: Every evening | ORAL | Status: DC | PRN
  Filled 2022-07-31: qty 1

## 2022-07-31 MED ORDER — ACETAMINOPHEN 500 MG PO TABS
ORAL_TABLET | ORAL | Status: AC
  Filled 2022-07-31: qty 2

## 2022-07-31 MED ORDER — ONDANSETRON HCL 4 MG/2ML IJ SOLN
INTRAMUSCULAR | Status: DC | PRN
  Administered 2022-07-31: 4 mg via INTRAVENOUS

## 2022-07-31 MED ORDER — ACETAMINOPHEN 325 MG PO TABS
325.0000 mg | ORAL_TABLET | Freq: Four times a day (QID) | ORAL | Status: DC
  Administered 2022-07-31 – 2022-08-01 (×3): 325 mg via ORAL
  Filled 2022-07-31 (×3): qty 1

## 2022-07-31 MED ORDER — DIPHENHYDRAMINE HCL 50 MG/ML IJ SOLN: 12.5000 mg | Freq: Four times a day (QID) | INTRAMUSCULAR | Status: DC | PRN

## 2022-07-31 MED ORDER — DIAZEPAM 2 MG PO TABS
2.0000 mg | ORAL_TABLET | Freq: Two times a day (BID) | ORAL | Status: DC | PRN
  Administered 2022-07-31: 2 mg via ORAL
  Filled 2022-07-31: qty 1

## 2022-07-31 MED ORDER — BUPIVACAINE HCL (PF) 0.5 % IJ SOLN
INTRAMUSCULAR | Status: DC | PRN
  Administered 2022-07-31: 25 mL via PERINEURAL

## 2022-07-31 MED ORDER — METHOCARBAMOL 1000 MG/10ML IJ SOLN
500.0000 mg | Freq: Three times a day (TID) | INTRAVENOUS | Status: DC | PRN
  Filled 2022-07-31: qty 5

## 2022-07-31 MED ORDER — DIPHENHYDRAMINE HCL 12.5 MG/5ML PO ELIX: 12.5000 mg | ORAL_SOLUTION | Freq: Four times a day (QID) | ORAL | Status: DC | PRN

## 2022-07-31 SURGICAL SUPPLY — 84 items
ADH SKN CLS APL DERMABOND .7 (GAUZE/BANDAGES/DRESSINGS) ×1
APL PRP STRL LF DISP 70% ISPRP (MISCELLANEOUS) ×1
APPLIER CLIP 9.375 MED OPEN (MISCELLANEOUS) ×1
APR CLP MED 9.3 20 MLT OPN (MISCELLANEOUS) ×1
BAG DECANTER FOR FLEXI CONT (MISCELLANEOUS) ×2 IMPLANT
BINDER BREAST LRG (GAUZE/BANDAGES/DRESSINGS) IMPLANT
BINDER BREAST MEDIUM (GAUZE/BANDAGES/DRESSINGS) IMPLANT
BINDER BREAST XLRG (GAUZE/BANDAGES/DRESSINGS) IMPLANT
BINDER BREAST XXLRG (GAUZE/BANDAGES/DRESSINGS) IMPLANT
BIOPATCH RED 1 DISK 7.0 (GAUZE/BANDAGES/DRESSINGS) ×2 IMPLANT
BLADE HEX COATED 2.75 (ELECTRODE) ×2 IMPLANT
BLADE SURG 10 STRL SS (BLADE) ×2 IMPLANT
BLADE SURG 15 STRL LF DISP TIS (BLADE) ×2 IMPLANT
BLADE SURG 15 STRL SS (BLADE) ×1
BNDG GAUZE DERMACEA FLUFF 4 (GAUZE/BANDAGES/DRESSINGS) ×4 IMPLANT
BNDG GZE DERMACEA 4 6PLY (GAUZE/BANDAGES/DRESSINGS) ×2
CANISTER SUCT 1200ML W/VALVE (MISCELLANEOUS) ×2 IMPLANT
CHLORAPREP W/TINT 26 (MISCELLANEOUS) ×2 IMPLANT
CLIP APPLIE 9.375 MED OPEN (MISCELLANEOUS) ×2 IMPLANT
COVER BACK TABLE 60X90IN (DRAPES) ×2 IMPLANT
COVER MAYO STAND STRL (DRAPES) ×2 IMPLANT
COVER PROBE CYLINDRICAL 5X96 (MISCELLANEOUS) ×2 IMPLANT
DERMABOND ADVANCED .7 DNX12 (GAUZE/BANDAGES/DRESSINGS) ×2 IMPLANT
DEVICE DSSCT PLSMBLD 3.0S LGHT (MISCELLANEOUS) ×2 IMPLANT
DRAIN CHANNEL 19F RND (DRAIN) ×2 IMPLANT
DRAPE LAPAROSCOPIC ABDOMINAL (DRAPES) ×2 IMPLANT
DRAPE UTILITY XL STRL (DRAPES) ×2 IMPLANT
DRSG OPSITE POSTOP 4X6 (GAUZE/BANDAGES/DRESSINGS) IMPLANT
DRSG TEGADERM 2-3/8X2-3/4 SM (GAUZE/BANDAGES/DRESSINGS) ×2 IMPLANT
ELECT BLADE 4.0 EZ CLEAN MEGAD (MISCELLANEOUS) ×1
ELECT COATED BLADE 2.86 ST (ELECTRODE) IMPLANT
ELECT REM PT RETURN 9FT ADLT (ELECTROSURGICAL) ×1
ELECTRODE BLDE 4.0 EZ CLN MEGD (MISCELLANEOUS) ×2 IMPLANT
ELECTRODE REM PT RTRN 9FT ADLT (ELECTROSURGICAL) ×2 IMPLANT
EVACUATOR SILICONE 100CC (DRAIN) ×2 IMPLANT
FUNNEL KELLER 2 DISP (MISCELLANEOUS) IMPLANT
GAUZE PAD ABD 8X10 STRL (GAUZE/BANDAGES/DRESSINGS) ×4 IMPLANT
GAUZE SPONGE 4X4 12PLY STRL LF (GAUZE/BANDAGES/DRESSINGS) ×2 IMPLANT
GLOVE BIO SURGEON STRL SZ 6.5 (GLOVE) ×4 IMPLANT
GLOVE BIO SURGEON STRL SZ7.5 (GLOVE) ×2 IMPLANT
GOWN STRL REUS W/ TWL LRG LVL3 (GOWN DISPOSABLE) ×6 IMPLANT
GOWN STRL REUS W/TWL LRG LVL3 (GOWN DISPOSABLE) ×3
IMPL EXPANDER BREAST 535CC (Breast) IMPLANT
IMPLANT EXPANDER BREAST 535CC (Breast) ×1 IMPLANT
IV NS 1000ML (IV SOLUTION)
IV NS 1000ML BAXH (IV SOLUTION) IMPLANT
IV NS 500ML (IV SOLUTION) ×1
IV NS 500ML BAXH (IV SOLUTION) ×2 IMPLANT
KIT FILL ASEPTIC TRANSFER (MISCELLANEOUS) IMPLANT
NDL HYPO 25X1 1.5 SAFETY (NEEDLE) ×2 IMPLANT
NEEDLE HYPO 25X1 1.5 SAFETY (NEEDLE) ×1 IMPLANT
NS IRRIG 1000ML POUR BTL (IV SOLUTION) ×2 IMPLANT
PACK BASIN DAY SURGERY FS (CUSTOM PROCEDURE TRAY) ×2 IMPLANT
PAD FOAM SILICONE BACKED (GAUZE/BANDAGES/DRESSINGS) IMPLANT
PENCIL SMOKE EVACUATOR (MISCELLANEOUS) ×2 IMPLANT
PIN SAFETY STERILE (MISCELLANEOUS) ×2 IMPLANT
PLASMABLADE 3.0S W/LIGHT (MISCELLANEOUS) ×1
SLEEVE SCD COMPRESS KNEE MED (STOCKING) ×2 IMPLANT
SPIKE FLUID TRANSFER (MISCELLANEOUS) IMPLANT
SPONGE T-LAP 18X18 ~~LOC~~+RFID (SPONGE) ×4 IMPLANT
STRIP SUTURE WOUND CLOSURE 1/2 (MISCELLANEOUS) IMPLANT
SUT ETHILON 2 0 FS 18 (SUTURE) ×2 IMPLANT
SUT MNCRL AB 4-0 PS2 18 (SUTURE) ×2 IMPLANT
SUT MON AB 3-0 SH 27 (SUTURE)
SUT MON AB 3-0 SH27 (SUTURE) ×2 IMPLANT
SUT MON AB 5-0 PS2 18 (SUTURE) IMPLANT
SUT PDS 3-0 CT2 (SUTURE) ×1
SUT PDS AB 2-0 CT2 27 (SUTURE) IMPLANT
SUT PDS II 3-0 CT2 27 ABS (SUTURE) IMPLANT
SUT SILK 2 0 SH (SUTURE) IMPLANT
SUT SILK 3 0 PS 1 (SUTURE) ×2 IMPLANT
SUT VIC AB 3-0 SH 27 (SUTURE)
SUT VIC AB 3-0 SH 27X BRD (SUTURE) IMPLANT
SUT VICRYL 3-0 CR8 SH (SUTURE) ×2 IMPLANT
SYR BULB IRRIG 60ML STRL (SYRINGE) ×2 IMPLANT
SYR CONTROL 10ML LL (SYRINGE) ×2 IMPLANT
TISSUE FLEXHD PERF PLIAB 8X16 (Tissue) IMPLANT
TOWEL GREEN STERILE FF (TOWEL DISPOSABLE) ×4 IMPLANT
TRACER MAGTRACE VIAL (MISCELLANEOUS) IMPLANT
TRAY DSU PREP LF (CUSTOM PROCEDURE TRAY) ×2 IMPLANT
TRAY FOLEY W/BAG SLVR 14FR LF (SET/KITS/TRAYS/PACK) IMPLANT
TUBE CONNECTING 20X1/4 (TUBING) ×2 IMPLANT
UNDERPAD 30X36 HEAVY ABSORB (UNDERPADS AND DIAPERS) ×4 IMPLANT
YANKAUER SUCT BULB TIP NO VENT (SUCTIONS) ×2 IMPLANT

## 2022-07-31 NOTE — H&P (Signed)
REFERRING PHYSICIAN: Malachy Mood, MD PROVIDER: Lindell Noe, MD MRN: [id number] DOB: Oct 20, 1955 Subjective  Chief Complaint: Breast Cancer  History of Present Illness: Meghan Mcdonald is a 67 y.o. female who is seen today as an office consultation for evaluation of Breast Cancer  We are asked to see the patient in consultation by Dr. Mosetta Putt to evaluate her for a new right breast cancer. The patient is a 67 year old white female who recently went for a routine screening mammogram. At that time she was found to have 2 small areas of calcification in the central and medial right breast each measuring 7 mm. The 2 areas of calcification were separated by 3 cm. Each was biopsied and came back as ductal carcinoma in situ that was ER and PR positive. She is otherwise in good health and does not smoke.  Review of Systems: A complete review of systems was obtained from the patient. I have reviewed this information and discussed as appropriate with the patient. See HPI as well for other ROS.  ROS  Medical History: Past Medical History: Diagnosis Date GERD (gastroesophageal reflux disease) History of cancer Hyperlipidemia Hypertension  Patient Active Problem List Diagnosis Ductal carcinoma in situ (DCIS) of right breast  Past Surgical History: Procedure Laterality Date ORIF ANKLE FRACTURE Right 07/22/2018 CHOLECYSTECTOMY ENDOSCOPIC CARPAL TUNNEL RELEASE   Allergies Allergen Reactions Chlorhexidine Gluconate Rash Skin blisters Amitriptyline Other (See Comments) oversedation Aspirin Other (See Comments) Severe Blood Thinning Telmisartan Other (See Comments) unknown Norethindrone-E.Estradiol-Iron Rash Prednisone Rash  Current Outpatient Medications on File Prior to Visit Medication Sig Dispense Refill atorvastatin (LIPITOR) 80 MG tablet Take 80 mg by mouth once daily hydroCHLOROthiazide (HYDRODIURIL) 25 MG tablet Take 25 mg by mouth every morning metFORMIN (GLUCOPHAGE-XR) 500 MG  XR tablet Take 1,000 mg by mouth daily with dinner metoprolol tartrate (LOPRESSOR) 50 MG tablet Take 50 mg by mouth 2 (two) times daily with meals ramipriL (ALTACE) 10 MG capsule Take 10 mg by mouth 2 (two) times daily venlafaxine (EFFEXOR-XR) 75 MG XR capsule Take 75 mg by mouth once daily With food  No current facility-administered medications on file prior to visit.  Family History Problem Relation Age of Onset High blood pressure (Hypertension) Mother High blood pressure (Hypertension) Father   Social History  Tobacco Use Smoking Status Never Smokeless Tobacco Never   Social History  Socioeconomic History Marital status: Married Tobacco Use Smoking status: Never Smokeless tobacco: Never  Objective: There were no vitals filed for this visit. There is no height or weight on file to calculate BMI.  Physical Exam  The exam could not be performed today as this was an audiovisual meeting on the computer  Labs, Imaging and Diagnostic Testing:  Assessment and Plan:  Diagnoses and all orders for this visit:  Ductal carcinoma in situ (DCIS) of right breast   The patient appears to have 2 small 7 mm areas of ductal carcinoma in situ in the right breast centrally and medially. I have discussed with her in detail the different options for treatment and at this point she favors breast conservation which I feel is very reasonable. She would need to radioactive seeds to localize the areas. I do think it would be wise to evaluate her further with MRI to make sure this is not part of a larger area prior to surgery. If the MRI looks similar then we can move forward with surgical scheduling. I have discussed with her in detail the risks and benefits of the operation as well as some  of the technical aspects including the use of radioactive seeds for localization and she understands and wishes to proceed. She will also meet with medical and radiation oncology to discuss adjuvant therapy.  She does have a trip to [place] scheduled for May 9.  MRI showed third area of cancer. She has elected for right mastectomy with sentinel node biopsy and reconstruction

## 2022-07-31 NOTE — Anesthesia Postprocedure Evaluation (Signed)
Anesthesia Post Note  Patient: Meghan Mcdonald  Procedure(s) Performed: RIGHT MASTECTOMY WITH SENTINEL LYMPH NODE BIOPSY (Right: Breast) IMMEDIATE RIGHT BREAST RECONSTRUCTION WITH PLACEMENT OF TISSUE EXPANDER AND FLEX HD (ACELLULAR HYDRATED DERMIS) (Right: Breast)     Patient location during evaluation: PACU Anesthesia Type: General Level of consciousness: awake and alert Pain management: pain level controlled Vital Signs Assessment: post-procedure vital signs reviewed and stable Respiratory status: spontaneous breathing, nonlabored ventilation, respiratory function stable and patient connected to nasal cannula oxygen Cardiovascular status: blood pressure returned to baseline and stable Postop Assessment: no apparent nausea or vomiting Anesthetic complications: no  No notable events documented.  Last Vitals:  Vitals:   07/31/22 1230 07/31/22 1245  BP: (!) 144/70   Pulse: 78 80  Resp: 15 20  Temp:    SpO2: 99% 99%    Last Pain:  Vitals:   07/31/22 1245  TempSrc:   PainSc: 4                  Kennieth Rad

## 2022-07-31 NOTE — Interval H&P Note (Signed)
History and Physical Interval Note:  07/31/2022 7:49 AM  Meghan Mcdonald  has presented today for surgery, with the diagnosis of RIGHT BREAST CANCER.  The various methods of treatment have been discussed with the patient and family. After consideration of risks, benefits and other options for treatment, the patient has consented to  Procedure(s): RIGHT MASTECTOMY WITH SENTINEL LYMPH NODE BIOPSY (Right) IMMEDIATE RIGHT BREAST RECONSTRUCTION WITH PLACEMENT OF TISSUE EXPANDER AND FLEX HD (ACELLULAR HYDRATED DERMIS) (Right) as a surgical intervention.  The patient's history has been reviewed, patient examined, no change in status, stable for surgery.  I have reviewed the patient's chart and labs.  Questions were answered to the patient's satisfaction.     [name]

## 2022-07-31 NOTE — Op Note (Signed)
Op report    DATE OF OPERATION:  07/31/2022  LOCATION: [facility]  SURGICAL DIVISION: Plastic Surgery  PREOPERATIVE DIAGNOSES:  1. Right Breast ductal carcinoma.    POSTOPERATIVE DIAGNOSES:  Same as preoperative diagnosis  PROCEDURE:  1. Right immediate breast reconstruction with placement of Acellular Dermal Matrix and tissue expanders.  SURGEON: Foster Simpson, DO  ASSISTANT: Evelena Leyden, PA  ANESTHESIA:  General.   COMPLICATIONS: None.   IMPLANTS: Right - Mentor 535 cc. Ref #[id number]. 150 cc of injectable saline placed in the expander. Acellular Dermal Matrix 8 x 16 cm  INDICATIONS FOR PROCEDURE:  The patient, Meghan Mcdonald, is a 67 y.o. female born on 1956/02/06, is here for  immediate first stage breast reconstruction with placement of a right tissue expander and Acellular dermal matrix. MRN: [id number]  CONSENT:  Informed consent was obtained directly from the patient. Risks, benefits and alternatives were fully discussed. Specific risks including but not limited to bleeding, infection, hematoma, seroma, scarring, pain, implant infection, implant extrusion, capsular contracture, asymmetry, wound healing problems, and need for further surgery were all discussed. The patient did have an ample opportunity to have her questions answered to her satisfaction.   DESCRIPTION OF PROCEDURE:  The patient was taken to the operating room by the general surgery team. SCDs were placed and IV antibiotics were given. The patient's chest was prepped and draped in a sterile fashion. A time out was performed and the implants to be used were identified.  A right mastectomy was performed.  Once the general surgery team had completed their portion of the case the patient was rendered to the plastic and reconstructive surgery team.  Right:  The pectoralis major muscle was lifted from the chest wall with release of the lateral edge and lateral inframammary fold.  The  pocket was irrigated with antibiotic solution and hemostasis was achieved with electrocautery.  The ADM was then prepared according to the manufacture guidelines and slits placed to help with postoperative fluid management.  The ADM was then sutured to the inferior and lateral edge of the inframammary fold with 2-0 PDS starting with an interrupted stitch and then a running stitch.  The lateral portion was sutured to with interrupted sutures after the expander was placed.  The expander was prepared according to the manufacture guidelines, the air evacuated and then it was placed under the ADM and pectoralis major muscle.  The inferior and lateral tabs were used to secure the expander to the chest wall with 2-0 PDS.  The drain was placed at the inframammary fold over the ADM and secured to the skin with 3-0 Silk.    The deep layers were closed with 3-0 Monocryl followed by 4-0 Monocryl.  The skin was closed with 5-0 Monocryl and then dermabond was applied.  The ABDs and breast binder were placed.  The patient tolerated the procedure well and there were no complications.  The patient was allowed to wake from anesthesia and taken to the recovery room in satisfactory condition.   The advanced practice practitioner (APP) assisted throughout the case.  The APP was essential in retraction and counter traction when needed to make the case progress smoothly.  This retraction and assistance made it possible to see the tissue plans for the procedure.  The assistance was needed for blood control, tissue re-approximation and assisted with closure of the incision site.

## 2022-07-31 NOTE — Discharge Instructions (Addendum)
INSTRUCTIONS FOR AFTER BREAST SURGERY   You will likely have some questions about what to expect following your operation.  The following information will help you and your family understand what to expect when you are discharged from the hospital.  It is important to follow these guidelines to help ensure a smooth recovery and reduce complication.  Postoperative instructions include information on: diet, wound care, medications and physical activity.  AFTER SURGERY Expect to go home after the procedure.  In some cases, you may need to spend one night in the hospital for observation.  DIET Breast surgery does not require a specific diet.  However, the healthier you eat the better your body will heal. It is important to increasing your protein intake.  This means limiting the foods with sugar and carbohydrates.  Focus on vegetables and some meat.  If you have liposuction during your procedure be sure to drink water.  If your urine is bright yellow, then it is concentrated, and you need to drink more water.  As a general rule after surgery, you should have 8 ounces of water every hour while awake.  If you find you are persistently nauseated or unable to take in liquids let us know.  NO TOBACCO USE or EXPOSURE.  This will slow your healing process and lead to a wound.  WOUND CARE Leave the binder on for 3 days . Use fragrance free soap like Dial, Dove or [place].   After 3 days you can remove the binder to shower. Once dry apply binder or sports bra. If you have liposuction you will have a soft and spongy dressing (Lipofoam) that helps prevent creases in your skin.  Remove before you shower and then replace it.  It is also available on [employer]. If you have steri-strips / tape directly attached to your skin leave them in place. It is OK to get these wet.   No baths, pools or hot tubs for four weeks. We close your incision to leave the smallest and best-looking scar. No ointment or creams on your incisions  for four weeks.  No Neosporin (Too many skin reactions).  A few weeks after surgery you can use Mederma and start massaging the scar. We ask you to wear your binder or sports bra for the first 6 weeks around the clock, including while sleeping. This provides added comfort and helps reduce the fluid accumulation at the surgery site. NO Ice or heating pads to the operative site.  You have a very high risk of a BURN before you feel the temperature change.  ACTIVITY No heavy lifting until cleared by the doctor.  This usually means no more than a half-gallon of milk.  It is OK to walk and climb stairs. Moving your legs is very important to decrease your risk of a blood clot.  It will also help keep you from getting deconditioned.  Every 1 to 2 hours get up and walk for 5 minutes. This will help with a quicker recovery back to normal.  Let pain be your guide so you don't do too much.  This time is for you to recover.  You will be more comfortable if you sleep and rest with your head elevated either with a few pillows under you or in a recliner.  No stomach sleeping for a three months.  WORK Everyone returns to work at different times. As a rough guide, most people take at least 1 - 2 weeks off prior to returning to work. If  you need documentation for your job, give the forms to the front staff at the clinic.  DRIVING Arrange for someone to bring you home from the hospital after your surgery.  You may be able to drive a few days after surgery but not while taking any narcotics or valium.  BOWEL MOVEMENTS Constipation can occur after anesthesia and while taking pain medication.  It is important to stay ahead for your comfort.  We recommend taking Milk of Magnesia (2 tablespoons; twice a day) while taking the pain pills.  MEDICATIONS You may be prescribed should start after surgery At your preoperative visit for you history and physical you may have been given the following medications: An antibiotic: Start  this medication when you get home and take according to the instructions on the bottle. Zofran 4 mg:  This is to treat nausea and vomiting.  You can take this every 6 hours as needed and only if needed. Valium 2 mg for breast cancer patients: This is for muscle tightness if you have an implant or expander. This will help relax your muscle which also helps with pain control.  This can be taken every 12 hours as needed. Don't drive after taking this medication. Norco (hydrocodone/acetaminophen) 5/325 mg:  This is only to be used after you have taken the Motrin or the Tylenol. Every 8 hours as needed.   Over the counter Medication to take: Ibuprofen (Motrin) 600 mg:  Take this every 6 hours.  If you have additional pain then take 500 mg of the Tylenol every 8 hours.  Only take the Norco after you have tried these two. MiraLAX or Milk of Magnesia: Take this according to the bottle if you take the [place] Call your surgeon's office if any of the following occur: Fever 101 degrees F or greater Excessive bleeding or fluid from the incision site. Pain that increases over time without aid from the medications Redness, warmth, or pus draining from incision sites Persistent nausea or inability to take in liquids Severe misshapen area that underwent the operation.  Here are some resources for breast cancer patients:  Plastic surgery website: https://www.plasticsurgery.org/for-medical-professionals/education-and-resources/publications/breast-reconstruction-magazine Breast Reconstruction Awareness Campaign:  [employer]nz Plastic surgery Implant information:  https://www.plasticsurgery.org/patient-safety/breast-implant-safety  About my Ragen-Pratt Bulb Drain  What is a Pieratt-Pratt bulb? A Scobey-Pratt is a soft, round device used to collect drainage. It is connected to a long, thin drainage catheter, which is held in place by one or two small stiches near your  surgical incision site. When the bulb is squeezed, it forms a vacuum, forcing the drainage to empty into the bulb.  Emptying the Rendall-Pratt bulb- To empty the bulb: 1. Release the plug on the top of the bulb. 2. Pour the bulb's contents into a measuring container which your nurse will provide. 3. Record the time emptied and amount of drainage. Empty the drain(s) as often as your     doctor or nurse recommends.  Date                  Time                    Amount (Drain 1)                 Amount (Drain 2)  _____________________________________________________________________  _____________________________________________________________________  _____________________________________________________________________  _____________________________________________________________________  _____________________________________________________________________  _____________________________________________________________________  _____________________________________________________________________  _____________________________________________________________________  Squeezing the Rampy-Pratt Bulb- To squeeze the bulb: 1. Make sure the plug at the top of  the bulb is open. 2. Squeeze the bulb tightly in your fist. You will hear air squeezing from the bulb. 3. Replace the plug while the bulb is squeezed. 4. Use a safety pin to attach the bulb to your clothing. This will keep the catheter from     pulling at the bulb insertion site.  When to call your doctor- Call your doctor if: Drain site becomes red, swollen or hot. You have a fever greater than 101 degrees F. There is oozing at the drain site. Drain falls out (apply a guaze bandage over the drain hole and secure it with tape). Drainage increases daily not related to activity patterns. (You will usually have more drainage when you are active than when you are resting.) Drainage has a bad odor.

## 2022-07-31 NOTE — Anesthesia Preprocedure Evaluation (Signed)
Anesthesia Evaluation  Patient identified by MRN, date of birth, ID band Patient awake    Reviewed: Allergy & Precautions, H&P , NPO status , Patient's Chart, lab work & pertinent test results  Airway Mallampati: II   Neck ROM: full    Dental   Pulmonary neg pulmonary ROS   breath sounds clear to auscultation       Cardiovascular hypertension,  Rhythm:regular Rate:Normal     Neuro/Psych  Headaches PSYCHIATRIC DISORDERS Anxiety Depression    CVA    GI/Hepatic ,GERD  ,,  Endo/Other  diabetes, Type 2    Renal/GU      Musculoskeletal   Abdominal   Peds  Hematology   Anesthesia Other Findings   Reproductive/Obstetrics Breast CA                             Anesthesia Physical Anesthesia Plan  ASA: 2  Anesthesia Plan: General   Post-op Pain Management: Regional block*   Induction: Intravenous  PONV Risk Score and Plan: 3 and Ondansetron, Dexamethasone, Midazolam and Treatment may vary due to age or medical condition  Airway Management Planned: LMA  Additional Equipment:   Intra-op Plan:   Post-operative Plan: Extubation in OR  Informed Consent: I have reviewed the patients History and Physical, chart, labs and discussed the procedure including the risks, benefits and alternatives for the proposed anesthesia with the patient or authorized representative who has indicated his/her understanding and acceptance.     Dental advisory given  Plan Discussed with: CRNA, Anesthesiologist and Surgeon  Anesthesia Plan Comments:        Anesthesia Quick Evaluation

## 2022-07-31 NOTE — Anesthesia Procedure Notes (Addendum)
Anesthesia Regional Block: Pectoralis block   Pre-Anesthetic Checklist: , timeout performed,  Correct Patient, Correct Site, Correct Laterality,  Correct Procedure, Correct Position, site marked,  Risks and benefits discussed,  Surgical consent,  Pre-op evaluation,  At surgeon's request and post-op pain management  Laterality: Right  Prep: Betadine       Needles:  Injection technique: Single-shot  Needle Type: Echogenic Needle     Needle Length: 9cm  Needle Gauge: 21     Additional Needles:   Narrative:  Start time: 07/31/2022 7:52 AM End time: 07/31/2022 7:59 AM Injection made incrementally with aspirations every 5 mL.  Performed by: Personally  Anesthesiologist: Achille Rich, MD  Additional Notes: Pt tolerated the procedure well.

## 2022-07-31 NOTE — Transfer of Care (Signed)
Immediate Anesthesia Transfer of Care Note  Patient: Meghan Mcdonald  Procedure(s) Performed: RIGHT MASTECTOMY WITH SENTINEL LYMPH NODE BIOPSY (Right: Breast) IMMEDIATE RIGHT BREAST RECONSTRUCTION WITH PLACEMENT OF TISSUE EXPANDER AND FLEX HD (ACELLULAR HYDRATED DERMIS) (Right: Breast)  Patient Location: PACU  Anesthesia Type:GA combined with regional for post-op pain  Level of Consciousness: awake, alert , oriented, drowsy, and patient cooperative  Airway & Oxygen Therapy: Patient Spontanous Breathing and Patient connected to face mask oxygen  Post-op Assessment: Report given to RN and Post -op Vital signs reviewed and stable  Post vital signs: Reviewed and stable  Last Vitals:  Vitals Value Taken Time  BP    Temp    Pulse    Resp    SpO2      Last Pain:  Vitals:   07/31/22 0701  TempSrc: Oral  PainSc: 0-No pain      Patients Stated Pain Goal: 3 (07/31/22 0701)  Complications: No notable events documented.

## 2022-07-31 NOTE — Op Note (Signed)
07/31/2022  10:05 AM  PATIENT:  Meghan Mcdonald  67 y.o. female  PRE-OPERATIVE DIAGNOSIS:  RIGHT BREAST CANCER  POST-OPERATIVE DIAGNOSIS:  RIGHT BREAST CANCER  PROCEDURE:  Procedure(s): RIGHT MASTECTOMY WITH DEEP RIGHT AXILLARY SENTINEL LYMPH NODE BIOPSY (Right)  SURGEON:  Surgeon(s) and Role: Panel 1:    * Griselda Miner, MD - Primary  PHYSICIAN ASSISTANT:   ASSISTANTS: none   ANESTHESIA:   general  EBL:  20 mL   BLOOD ADMINISTERED:none  DRAINS: none   LOCAL MEDICATIONS USED:  NONE  SPECIMEN:  Source of Specimen:  right mastectomy and sentinel nodes x 2  DISPOSITION OF SPECIMEN:  PATHOLOGY  COUNTS:  YES  TOURNIQUET:  * No tourniquets in log *  DICTATION: .Dragon Dictation  After informed consent was obtained the patient was brought to the operating room and placed in the supine position on the operating table.  After adequate induction of general anesthesia the patient's bilateral chest, breast, and axillary areas were prepped with Betadine and draped in usual sterile manner.  An appropriate timeout was performed.  Earlier in the day the patient underwent injection of 1 mCi of technetium sulfur colloid in the subareolar position on the right.  An elliptical incision was then made around the nipple and areola complex in order to spare as much skin as possible with a 10 blade knife.  The incision was carried through the skin and subcutaneous tissue sharply with the PlasmaBlade.  Breast hooks were used to elevate the skin flaps anteriorly towards the ceiling.  Thin skin flaps were then created by dissecting between the breast tissue in the subcutaneous fat and skin.  This dissection was carried circumferentially all the way to the muscle of the chest wall.  Next the breast was removed from the pectoralis muscle with the pectoralis fascia.  Once this dissection was accomplished then the entire right breast was removed from the patient.  It was marked with a stitch on the lateral skin  for orientation.  The breast was sent to pathology for further evaluation.  The neoprobe was set to technetium and an area of radioactivity was readily identified in the deep right axillary space.  Dissection was carried into the deep right axillary space under the direction of the neoprobe.  I was able to identify 1 hot lymph node and 1 palpable lymph node.  Each of these was excised sharply with the PlasmaBlade and the surrounding small vessels and lymphatics were controlled with clips.  Ex vivo counts on the node with radioactivity was 1500.  These were sent as sentinel nodes numbers 1 and 2.  No other hot or palpable nodes were identified in the right axilla.  Hemostasis was achieved using the PlasmaBlade.  The wound was irrigated with copious amounts of saline and packed with a moistened lap sponge.  The patient tolerated this portion of the procedure well.  All needle sponge and instrument counts were correct.  At this point the operation was turned over to Dr. Ulice Bold for the reconstruction.  Her portion will be dictated separately.  PLAN OF CARE: Admit for overnight observation  PATIENT DISPOSITION:  PACU - hemodynamically stable.   Delay start of Pharmacological VTE agent (>24hrs) due to surgical blood loss or risk of bleeding: not applicable

## 2022-07-31 NOTE — Progress Notes (Signed)
Nuc med injections completed. Patient tolerated well.   

## 2022-07-31 NOTE — Anesthesia Procedure Notes (Signed)
Procedure Name: LMA Insertion Date/Time: 07/31/2022 9:03 AM  Performed by: Ronnette Hila, CRNAPre-anesthesia Checklist: Patient identified, Emergency Drugs available, Suction available and Patient being monitored Patient Re-evaluated:Patient Re-evaluated prior to induction Oxygen Delivery Method: Circle System Utilized Preoxygenation: Pre-oxygenation with 100% oxygen Induction Type: IV induction Ventilation: Mask ventilation without difficulty LMA: LMA inserted LMA Size: 4.0 Number of attempts: 1 Airway Equipment and Method: bite block Placement Confirmation: positive ETCO2 Tube secured with: Tape Dental Injury: Teeth and Oropharynx as per pre-operative assessment

## 2022-07-31 NOTE — Progress Notes (Signed)
Assisted Dr. Hodierne with right, pectoralis, ultrasound guided block. Side rails up, monitors on throughout procedure. See vital signs in flow sheet. Tolerated Procedure well. 

## 2022-08-01 DIAGNOSIS — D0511 Intraductal carcinoma in situ of right breast: Secondary | ICD-10-CM | POA: Diagnosis not present

## 2022-08-01 DIAGNOSIS — Z7984 Long term (current) use of oral hypoglycemic drugs: Secondary | ICD-10-CM | POA: Diagnosis not present

## 2022-08-01 DIAGNOSIS — I10 Essential (primary) hypertension: Secondary | ICD-10-CM | POA: Diagnosis not present

## 2022-08-01 DIAGNOSIS — Z79899 Other long term (current) drug therapy: Secondary | ICD-10-CM | POA: Diagnosis not present

## 2022-08-01 MED ORDER — METHOCARBAMOL 500 MG PO TABS: 500.0000 mg | ORAL_TABLET | Freq: Two times a day (BID) | ORAL | 0 refills | Status: DC | PRN

## 2022-08-01 NOTE — Discharge Summary (Signed)
Physician Discharge Summary  Patient ID: Meghan Mcdonald MRN: [id number] DOB/AGE: 65-Jan-1957 65 y.o.  Admit date: 07/31/2022 Discharge date: 08/01/2022  Admission Diagnoses:  Discharge Diagnoses:  Principal Problem:   Breast cancer Active Problems:   S/P breast reconstruction   Discharged Condition: Patient is doing well, accompanied by husband at bedside.  States that she has been experiencing periodic muscle spasms which were treated effectively overnight with Robaxin.  She is ambulatory, voiding.  Tolerating p.o. intake without difficulty.  She feels prepared for discharge home.  Emphasized the importance of ambulation.  She understands that she is at an elevated risk for DVT postoperatively, but after discussing with surgeons will proceed with emphasis on early ambulation rather than need for pharmacologic prophylaxis.  Output has been approximately 120 cc from her right-sided drain overnight.  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  Hospital Course: Admitted for observation following right-sided mastectomy with sentinel lymph node biopsy and immediate reconstruction using tissue expander and Flex HD.  Consults: None  Significant Diagnostic Studies: None  Treatments: surgery: right-sided mastectomy with sentinel lymph node biopsy and immediate reconstruction using tissue expander and Flex HD.  Discharge Exam: Blood pressure (!) 140/82, pulse 81, temperature 98.4 F (36.9 C), resp. rate 16, height 5\' 2"  (1.575 m), weight 66.2 kg, SpO2 97 %. General appearance: alert, cooperative, and no distress Chest wall: Dressings remain firmly in place, no drainage noted.  Flaps appear healthy and viable.  Expander appropriately placed.  Drain intact and functional, normal-appearing dark red/purple output in bulb.  No bright red blood.  No seroma or hematoma. Extremities: No lower extremity swelling or edema.  Disposition: Discharge disposition: 01-Home or Self  Care       Discharge Instructions     Diet - low sodium heart healthy   Complete by: As directed    Increase activity slowly   Complete by: As directed       Allergies as of 08/01/2022       Reactions   Hibiclens [chlorhexidine Gluconate] Rash   Skin blisters   Prednisone Rash   Patient states turns red all over    Aspirin Other (See Comments)   Severe Blood Thinning   Micardis [telmisartan] Other (See Comments)   unknown   Amitriptyline Other (See Comments)   oversedation   Loestrin [norethindrone Acet-ethinyl Est] Rash        Medication List     TAKE these medications    acetaminophen 500 MG tablet Commonly known as: TYLENOL Take 1,000 mg by mouth as needed for moderate pain.   atorvastatin 80 MG tablet Commonly known as: LIPITOR Take 1 tablet (80 mg total) by mouth daily at 6 PM.   diazepam 5 MG tablet Commonly known as: VALIUM Take 1 tablet (5 mg total) by mouth every 8 (eight) hours as needed (nausea and dizziness).   FAMOTIDINE PO Take by mouth.   Fish Oil 1000 MG Caps Take by mouth.   hydrochlorothiazide 25 MG tablet Commonly known as: HYDRODIURIL Take 25 mg by mouth daily.   Jardiance 10 MG Tabs tablet Generic drug: empagliflozin Take 10 mg by mouth daily.   meclizine 25 MG tablet Commonly known as: ANTIVERT Take 2 tablets (50 mg total) by mouth 3 (three) times daily.   meloxicam 15 MG tablet Commonly known as: MOBIC   METFORMIN HCL ER PO Take by mouth.   methocarbamol 500 MG tablet Commonly known as: ROBAXIN Take 1 tablet (500 mg total) by mouth 2 (  two) times daily as needed for muscle spasms.   metoprolol tartrate 50 MG tablet Commonly known as: LOPRESSOR Take 50 mg by mouth 2 (two) times daily.   ondansetron 4 MG disintegrating tablet Commonly known as: ZOFRAN-ODT Take 1 tablet (4 mg total) by mouth every 8 (eight) hours as needed for nausea or vomiting.   PRESERVISION AREDS 2+MULTI VIT PO Take by mouth.   ramipril 10  MG capsule Commonly known as: ALTACE Take 10 mg by mouth 2 (two) times daily.        Follow-up Information     Dillingham, Alena Bills, DO Follow up in 10 day(s).   Specialty: Plastic Surgery Contact information: [address] [place] [address] [phone]                 [facility] [address] [place], [place] [address] [phone]  Signed: Evelena Leyden 08/01/2022, 8:18 AM

## 2022-08-01 NOTE — Telephone Encounter (Signed)
Meghan Mcdonald, [facility] called regarding post-op antibiotic order. Dr.Dillingham notified

## 2022-08-01 NOTE — Progress Notes (Signed)
1 Day Post-Op   Subjective/Chief Complaint: Complains of soreness   Objective: Vital signs in last 24 hours: Temp:  [97.1 F (36.2 C)-98.4 F (36.9 C)] 98.4 F (36.9 C) (04/12 0730) Pulse Rate:  [58-89] 81 (04/12 0730) Resp:  [7-20] 16 (04/12 0730) BP: (112-144)/(58-87) 140/82 (04/12 0730) SpO2:  [94 %-100 %] 97 % (04/12 0730)    Intake/Output from previous day: 04/11 0701 - 04/12 0700 In: 2160 [P.O.:660; I.V.:1500] Out: 155 [Drains:125; Blood:30] Intake/Output this shift: No intake/output data recorded.  General appearance: alert and cooperative Resp: clear to auscultation bilaterally Chest wall: skin flaps look good Cardio: regular rate and rhythm GI: soft, non-tender; bowel sounds normal; no masses,  no organomegaly  Lab Results:  No results for input(s): "WBC", "HGB", "HCT", "PLT" in the last 72 hours. BMET No results for input(s): "NA", "K", "CL", "CO2", "GLUCOSE", "BUN", "CREATININE", "CALCIUM" in the last 72 hours. PT/INR No results for input(s): "LABPROT", "INR" in the last 72 hours. ABG No results for input(s): "PHART", "HCO3" in the last 72 hours.  Invalid input(s): "PCO2", "PO2"  Studies/Results: NM Sentinel Node Inj-No Rpt (Breast)  Result Date: 07/31/2022 Sulfur Colloid was injected by the Nuclear Medicine Technologist for sentinel lymph node localization.    Anti-infectives: Anti-infectives (From admission, onward)    Start     Dose/Rate Route Frequency Ordered Stop   07/31/22 2000  ceFAZolin (ANCEF) IVPB 1 g/50 mL premix  Status:  Discontinued        1 g 100 mL/hr over 30 Minutes Intravenous Every 12 hours 07/31/22 1705 07/31/22 1717   07/31/22 2000  ceFAZolin (ANCEF) IVPB 1 g/50 mL premix        1 g 100 mL/hr over 30 Minutes Intravenous Every 12 hours 07/31/22 1717 08/01/22 1959   07/31/22 1400  ceFAZolin (ANCEF) 0.5 g in dextrose 5 % 100 mL IVPB  Status:  Discontinued        0.5 g 210 mL/hr over 30 Minutes Intravenous Every 8 hours 07/31/22  1049 07/31/22 1705   07/31/22 1048  ceFAZolin 1 g / gentamicin 80 mg in NS 500 mL surgical irrigation  Status:  Discontinued          As needed 07/31/22 1049 07/31/22 1101   07/31/22 0645  ceFAZolin (ANCEF) IVPB 2g/100 mL premix        2 g 200 mL/hr over 30 Minutes Intravenous On call to O.R. 07/31/22 3903 07/31/22 0847   07/31/22 0645  ceFAZolin (ANCEF) IVPB 2g/100 mL premix  Status:  Discontinued        2 g 200 mL/hr over 30 Minutes Intravenous On call to O.R. 07/31/22 0642 07/31/22 0805       Assessment/Plan: s/p Procedure(s): RIGHT MASTECTOMY WITH SENTINEL LYMPH NODE BIOPSY (Right) IMMEDIATE RIGHT BREAST RECONSTRUCTION WITH PLACEMENT OF TISSUE EXPANDER AND FLEX HD (ACELLULAR HYDRATED DERMIS) (Right) Advance diet Discharge  LOS: 0 days    Meghan Mcdonald 08/01/2022

## 2022-08-04 ENCOUNTER — Encounter (HOSPITAL_BASED_OUTPATIENT_CLINIC_OR_DEPARTMENT_OTHER): Payer: Self-pay | Admitting: General Surgery

## 2022-08-05 LAB — SURGICAL PATHOLOGY

## 2022-08-07 ENCOUNTER — Encounter: Payer: Self-pay | Admitting: General Practice

## 2022-08-07 NOTE — Progress Notes (Signed)
[facility] Spiritual Care Note  Reached Ms Doxtater by phone for post-op check-in. She is both looking forward to having her drain out and slightly dreading its removal. Beginning to be able to do meaningful tasks again (such as planting vegetables) brings her enjoyment.  Ms Faddis is aware of ongoing Spiritual Care availability in case needs arise in the future and knows to call anytime.   [address] [place], [place], [facility] Pager [phone] Voicemail [phone]

## 2022-08-07 NOTE — Progress Notes (Signed)
Patient is a 67 year old female here for follow-up after immediate right breast reconstruction with placement tissue expander Flex HD with Dr. Ulice Bold in conjunction with right mastectomy with Dr. Carolynne Edouard on 07/31/2022.  Intraoperatively she had a 535 cc Mentor ultrahigh tissue expander placed in the right breast.  150 cc of injectable saline was placed.  Patient reports that overall she is doing well today.  She does report that she took an oxycodone prior to today's appointment due to concern over pain with drain removal.  She reports she is here with her husband as her driver.  She is not having any infectious symptoms after surgery.  She reports JP drain output has been minimal.  Chaperone present on exam On exam right breast incision appears intact, she does have ecchymosis extending over the majority of the mastectomy site.  Mastectomy flaps are viable.  She does have sanguinous drainage in her JP drain bulb.  She does have some subcutaneous blood noted with palpation on exam, I am able to express 50 cc of dark sanguinous hematoma.  She does also have a blister that is approximately 1 x 3 mm just inferior to the mastectomy incision and central on the breast.  There is no erythema or cellulitic changes noted.  A/P:  50 cc of dark sanguinous hematoma was expressed from the right breast pocket.  Patient tolerated this well.  There is no signs of infection or concern on exam.  We discussed the need to keep the drain in place for another week at minimum due to the hematoma present.  All of her questions were answered to her content.  I do not see any signs of infection or concern on exam at this time.  Recommend monitoring the blistering noted and placing Vaseline and gauze over this area.  We placed injectable saline in the Expander using a sterile technique: Right: 30 cc for a total of 180 / 535 cc  Recommend 1 week follow-up

## 2022-08-08 ENCOUNTER — Encounter: Payer: Self-pay | Admitting: Surgical

## 2022-08-08 ENCOUNTER — Ambulatory Visit (INDEPENDENT_AMBULATORY_CARE_PROVIDER_SITE_OTHER): Payer: 59 | Admitting: Surgical

## 2022-08-08 VITALS — BP 150/92 | HR 65 | Ht 62.0 in | Wt 140.0 lb

## 2022-08-08 DIAGNOSIS — Z9889 Other specified postprocedural states: Secondary | ICD-10-CM

## 2022-08-08 DIAGNOSIS — D0511 Intraductal carcinoma in situ of right breast: Secondary | ICD-10-CM

## 2022-08-11 ENCOUNTER — Encounter: Payer: Self-pay | Admitting: *Deleted

## 2022-08-11 ENCOUNTER — Telehealth: Payer: Self-pay | Admitting: *Deleted

## 2022-08-11 DIAGNOSIS — C50911 Malignant neoplasm of unspecified site of right female breast: Secondary | ICD-10-CM | POA: Insufficient documentation

## 2022-08-11 NOTE — Telephone Encounter (Signed)
Ordered oncotype per Dr. Feng. Sent requisition to pathology. 

## 2022-08-14 ENCOUNTER — Ambulatory Visit (INDEPENDENT_AMBULATORY_CARE_PROVIDER_SITE_OTHER): Payer: 59 | Admitting: Surgical

## 2022-08-14 DIAGNOSIS — D0511 Intraductal carcinoma in situ of right breast: Secondary | ICD-10-CM

## 2022-08-14 NOTE — Progress Notes (Signed)
Patient is a 67 year old here for follow-up on her right breast reconstruction.  She underwent mastectomy with Dr. Carolynne Edouard followed by immediate breast reconstruction placement tissue expander Flex HD with Dr. Ulice Bold on 07/31/2022.  Patient is here with her husband today.  She was last seen in the office on 08/08/2022, at that time she had a hematoma of the right breast.  50 cc of dark sanguinous fluid was drained from the JP drain.  She reports that she has continued to notice dark blood draining from the JP drain, she reports that it has not improved.  She reports that she has been massaging the area herself as well.  She reports increased drainage after warm showers.  She reports that output has been approximately 20 to 40 cc per 24 hours.  This a.m. she drained 40 cc of dark sanguinous fluid from the drain.  Yesterday she had 38 cc total.  She reports she was very active yesterday and reports the increased drainage yesterday may be related to that.  Of note, she is planning to travel to [place] on May 8.  She is not having any infectious symptoms, she is otherwise feeling well.  She reports she tolerated her last fill well, reports some slight increased pain after the expansion.  Chaperone present on exam On exam, she does have mild incisional dehiscence noted of the medial right mastectomy incision.  The area of separation is approximately 1.3 cm wide.  With palpation there is minimal spreading of the dehiscence, approximately 1 mm.  It is approximately 1 mm or less in depth.  Inferior to this she does have blistering and a superficial wound present.  There is also ecchymosis of the majority of the central mastectomy flaps.  I do not appreciate any subcutaneous fluid collection with palpation.  She has minimal tenderness with palpation.  There is no erythema or cellulitic changes noted.  A/P:  Discussed with patient I would recommend leaving the drain in place for another few days to continue to  monitor the output given high output this a.m.  I do not see any signs of infection on exam.  I did discuss with her that the blistering and wound of her right breast/incisional dehiscence needs to be closely monitored.  I would like her to apply Vaseline and gauze to this 1-2 times per day.  I reported to the patient that I would discuss her case with Dr. Ulice Bold to see if any additional intervention will be necessary prior to her trip to [place] on May 8.  She reports she will be there for about 10 days.  No additional saline was placed in the expander today. She currently has a total of 180cc/535cc in the right tissue expander.  I would like her to follow-up early next week for possible removal of the right JP drain.  Pictures were obtained of the patient and placed in the chart with the patient's or guardian's permission.

## 2022-08-19 ENCOUNTER — Ambulatory Visit (INDEPENDENT_AMBULATORY_CARE_PROVIDER_SITE_OTHER): Payer: 59 | Admitting: Physician Assistant

## 2022-08-19 VITALS — BP 131/76 | HR 58

## 2022-08-19 DIAGNOSIS — D0511 Intraductal carcinoma in situ of right breast: Secondary | ICD-10-CM

## 2022-08-19 NOTE — Progress Notes (Cosign Needed Addendum)
This is a 67 year old female seen in our office for follow-up evaluation status post right-sided breast reconstruction.  She underwent mastectomy with Dr. Carolynne Edouard followed by immediate breast reconstruction with placement of tissue expander and Flex HD with Dr. Ulice Bold on 07/31/2022.  She was last seen in the office on 08/14/2022.  At that time she continued to have dark serosanguineous output out of her drain, she notes this has continued.  She denies any fever.  Chaperone present.  On exam approx. 1.5 x 3 x.25 cm wound noted along the right incision located centrally with some surrounding irritation, palpable fluid along the right lateral lower breast.  JP drain with minimal dark serosanguineous output.  No overlying redness or warmth to touch to the breast, mastectomy flaps are viable.   From a wound standpoint it appears the patient's wounds have improved slightly, she has some tissue growth around the previous dehiscence with no opening, she continues to have some irritation but no overt signs of infection.  The patient's drain had very minimal output today at bedside, reviewing her output chart it appears she has had approximately 65 cc of output over the last 24 hours.  On exam she does have some palpable fluid along the right lower breast, I tried to express this up into the medial breast over the port for attempted drainage, I was to accomplish this, the patient does have severe difficulty with laying secondary to vertigo which makes this even more complicated.  I was able to massage the breast and milked the drain which did increase the drain output.  I do not feel comfortable removing the drain at this time.    No additional injectable saline was placed, she has a total of 180 cc / 535 cc in the right expander  I would like to see the patient back in our office on Friday for repeat evaluation and assessment of the drain.  The patient is planning on going to [place] on May 8, I advised that she  still has ongoing issues that would make me reluctant recommend travel.  Dr. Ulice Bold had the opportunity to speak and evaluate the patient, she was unable to express any drainage over the port site, she agrees we will have the patient back in the office on Friday and likely in on Monday.  The patient was given strict return precautions.  She verbalized understanding and agreement to today's plan.

## 2022-08-20 ENCOUNTER — Telehealth: Payer: Self-pay

## 2022-08-20 DIAGNOSIS — Z170 Estrogen receptor positive status [ER+]: Secondary | ICD-10-CM | POA: Diagnosis not present

## 2022-08-20 DIAGNOSIS — C50911 Malignant neoplasm of unspecified site of right female breast: Secondary | ICD-10-CM | POA: Diagnosis not present

## 2022-08-20 NOTE — Telephone Encounter (Addendum)
Faxed [employer] order for Medi-honey to be applied every other day.  Received fax confirmation

## 2022-08-21 ENCOUNTER — Ambulatory Visit: Payer: 59 | Admitting: Physical Therapy

## 2022-08-21 NOTE — Progress Notes (Addendum)
Patient is a 67 year old female here for follow-up on her right breast reconstruction.  She underwent mastectomy with Dr. Carolynne Edouard followed by immediate breast reconstruction placement tissue expander by Dr. Ulice Bold on 07/31/2022.  She developed a wound postoperatively along the mastectomy incision and inferior mastectomy flap.  At her last appointment she was having approximately 65 cc of output over the 24 hours.  There was an attempt for needle aspiration over the right expander port.  This was unsuccessful.  Patient was evaluated by Dr. Ulice Bold at that time, recommended follow-up today and Monday, 08/25/2022.  Of note she was scheduled to travel to [place] on 08/27/2022.  Patient reports today that she started using the Medihoney/manuka honey on the right breast wound and reports that she has noticed significant redness and irritation in this area.  She has since stopped and reports that it is slowly improving and becoming less irritated/red.  She reports that she overall feels well, reports no infectious symptoms.  She is eating and drinking normally.  She reports that JP drain output has still been elevated, reports that she notices increased drainage after warm showers.  She reports that this a.m. she had 30 cc of output prior to her appointment.  She reports the output continues to be dark sanguinous in color.  Patient and her husband report that they are going to likely cancel their [place] trip.  They would like a letter stating that she is undergoing medical treatment and will not be able to travel for these reasons.  Patient and her husband request to see Dr. Ulice Bold today, they would like to discuss the plan with her and discussed next steps.  Chaperone present on exam On exam right breast JP drain is in place with dark sanguinous fluid noted in the bulb, approximately 2 to 3 cc. On exam of the right mastectomy/right breast reconstruction site, NAC is surgically absent.  She has a wound of  the right medial surgical site, there is surrounding irritation and erythema noted.  I do not appreciate any active drainage from the wound at this time.  She also has some developing thickened eschar of this area.  Along the mastectomy incision she has some mild exudate present.  I do not appreciate any cellulitic changes.  The erythema present seems to be more irritative.  Mild superior breast wall ecchymosis is noted, resolving.    A/P:  Patient is a 67 year old female here for follow-up on her right breast reconstruction after mastectomy on 07/31/2022. She is just over 3 weeks postop.  Postoperatively she did develop a right breast wound/blistering along the inferior mastectomy flap near the mid to medial breast.  She initially was applying Vaseline to this area, subsequently switched to Medihoney/manuka honey after her last appointment in the clinic on 08/19/2022.  At that time she was evaluated by Dr. Ulice Bold.    After starting the [facility] honey patient noticed irritation to the right breast, she has since stopped this and it is continuing to slowly improve.  I do not feel as if it appears infective in nature.   Patient is requesting to be seen by Dr. Ulice Bold today.  During time of appointment, Dr. Ulice Bold was in a procedure.  Offered patient to wait or follow-up later today for reevaluation with Dr. Ulice Bold.  Patient was agreeable to this, she reports that she will plan to follow-up in 1 to 2 hours for reevaluation.  She would like to discuss specific recommendations with Dr. Ulice Bold.  At this time, recommend  Vaseline and gauze to the right breast wound.  Discussed with patient this will help soften up the developing eschar of the right breast.  We discussed that surgical intervention may be necessary including excision of the right breast wound.  We discussed that if the expander becomes exposed then she will need to return to the operating room for additional surgical intervention.     Pictures were obtained of the patient and placed in the chart with the patient's or guardian's permission.

## 2022-08-22 ENCOUNTER — Ambulatory Visit (INDEPENDENT_AMBULATORY_CARE_PROVIDER_SITE_OTHER): Payer: 59 | Admitting: Surgical

## 2022-08-22 ENCOUNTER — Encounter: Payer: Self-pay | Admitting: Plastic Surgery

## 2022-08-22 DIAGNOSIS — D0511 Intraductal carcinoma in situ of right breast: Secondary | ICD-10-CM

## 2022-08-25 ENCOUNTER — Encounter: Payer: Self-pay | Admitting: Plastic Surgery

## 2022-08-25 ENCOUNTER — Ambulatory Visit (INDEPENDENT_AMBULATORY_CARE_PROVIDER_SITE_OTHER): Payer: 59 | Admitting: Plastic Surgery

## 2022-08-25 VITALS — BP 106/69 | HR 60 | Ht 62.0 in | Wt 145.0 lb

## 2022-08-25 DIAGNOSIS — Z9889 Other specified postprocedural states: Secondary | ICD-10-CM

## 2022-08-25 NOTE — Progress Notes (Signed)
   Subjective:    Patient ID: Meghan Mcdonald, female    DOB: 07-12-1955, 67 y.o.   MRN: [id number]  The patient is a 67 year old female here for follow-up after undergoing a right mastectomy.  She had right-sided breast cancer that was found after routine screening.  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  She does have diabetes, hyperlipidemia, hypertension and gastric reflux.  She had her surgery on April 11.  She has a Mentor 535 cc expander in place.  She had 150 cc placed at the time of surgery and then a total of 180 cc by April 30.  We took 50 cc out at her last appointment May 3.  She now has 130 cc in the expander.  She has some skin breakdown at the center portion of the breast.     Review of Systems  Constitutional:  Positive for activity change.  HENT: Negative.    Eyes: Negative.   Respiratory: Negative.    Cardiovascular: Negative.   Gastrointestinal: Negative.   Endocrine: Negative.   Genitourinary: Negative.   Musculoskeletal: Negative.        Objective:   Physical Exam Cardiovascular:     Rate and Rhythm: Normal rate.     Pulses: Normal pulses.  Musculoskeletal:        General: Tenderness present.  Skin:    General: Skin is warm.     Capillary Refill: Capillary refill takes less than 2 seconds.     Findings: Bruising and erythema present.  Neurological:     Mental Status: She is alert and oriented to person, place, and time.  Psychiatric:        Mood and Affect: Mood normal.        Behavior: Behavior normal.        Thought Content: Thought content normal.        Judgment: Judgment normal.       Assessment & Plan:   No diagnosis found.   Pictures were obtained of the patient and placed in the chart with the patient's or guardian's permission.

## 2022-08-26 ENCOUNTER — Inpatient Hospital Stay: Payer: 59 | Attending: Hematology | Admitting: Hematology

## 2022-08-26 ENCOUNTER — Other Ambulatory Visit: Payer: Self-pay

## 2022-08-26 ENCOUNTER — Encounter: Payer: Self-pay | Admitting: Hematology

## 2022-08-26 VITALS — BP 123/67 | HR 62 | Temp 97.9°F | Resp 17 | Wt 145.0 lb

## 2022-08-26 DIAGNOSIS — Z87442 Personal history of urinary calculi: Secondary | ICD-10-CM | POA: Diagnosis not present

## 2022-08-26 DIAGNOSIS — E785 Hyperlipidemia, unspecified: Secondary | ICD-10-CM | POA: Diagnosis not present

## 2022-08-26 DIAGNOSIS — Z170 Estrogen receptor positive status [ER+]: Secondary | ICD-10-CM | POA: Insufficient documentation

## 2022-08-26 DIAGNOSIS — K219 Gastro-esophageal reflux disease without esophagitis: Secondary | ICD-10-CM | POA: Diagnosis not present

## 2022-08-26 DIAGNOSIS — E119 Type 2 diabetes mellitus without complications: Secondary | ICD-10-CM | POA: Diagnosis not present

## 2022-08-26 DIAGNOSIS — D0511 Intraductal carcinoma in situ of right breast: Secondary | ICD-10-CM | POA: Diagnosis not present

## 2022-08-26 DIAGNOSIS — Z79811 Long term (current) use of aromatase inhibitors: Secondary | ICD-10-CM | POA: Diagnosis not present

## 2022-08-26 DIAGNOSIS — C50411 Malignant neoplasm of upper-outer quadrant of right female breast: Secondary | ICD-10-CM

## 2022-08-26 DIAGNOSIS — I10 Essential (primary) hypertension: Secondary | ICD-10-CM | POA: Insufficient documentation

## 2022-08-26 DIAGNOSIS — Z7984 Long term (current) use of oral hypoglycemic drugs: Secondary | ICD-10-CM | POA: Insufficient documentation

## 2022-08-26 DIAGNOSIS — Z79899 Other long term (current) drug therapy: Secondary | ICD-10-CM | POA: Diagnosis not present

## 2022-08-26 MED ORDER — ANASTROZOLE 1 MG PO TABS: 1.0000 mg | ORAL_TABLET | Freq: Every day | ORAL | 3 refills | Status: DC

## 2022-08-26 NOTE — Assessment & Plan Note (Signed)
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 -She underwent a right mastectomy on July 31, 2022.  I reviewed her surgical pathology findings with her in detail, it showed 6 mm invasive ductal carcinoma, grade 2, and DCIS.  Surgical margins were negative, lymph nodes were negative. -We discussed her Oncotype results, which showed recurrence score of 21.  This is considered low risk disease, adjuvant chemotherapy is not recommended. -She does not need postmastectomy -I discussed her risk of recurrence, and recommended her to consider adjuvant antiestrogen therapy.  I discussed tamoxifen and AI, and recommended her to try AI first. -The potential benefit and side effects, which includes but not limited to, hot flash, skin and vaginal dryness, metabolic changes ( increased blood glucose, cholesterol, weight, etc.), slightly in increased risk of cardiovascular disease, cataracts, muscular and joint discomfort, osteopenia and osteoporosis, etc, were discussed with her in great details. She is interested, and she will start next month, after she recovers better from surgery. -We discussed cancer surveillance, and what to watch at home.

## 2022-08-26 NOTE — Progress Notes (Signed)
[facility]   [phone] [phone]   Clinic Follow up Note   Patient Care Team: Verl Blalock as PCP - General (Family Medicine) Griselda Miner, MD as Consulting Physician (General Surgery) Malachy Mood, MD as Consulting Physician (Hematology) Dorothy Puffer, MD as Consulting Physician (Radiation Oncology) Pershing Proud, RN as Oncology Nurse Navigator Donnelly Angelica, RN as Oncology Nurse Navigator  Date of Service:  08/26/2022  CHIEF COMPLAINT: f/u of Ductal carcinoma in situ (DCIS) of right breast   CURRENT THERAPY:  Tamoxifen starting in 09/2022   ASSESSMENT:  Meghan Mcdonald is a 67 y.o. 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 -She underwent a right mastectomy on July 31, 2022.  I reviewed her surgical pathology findings with her in detail, it showed 6 mm invasive ductal carcinoma, grade 2, and DCIS.  Surgical margins were negative, lymph nodes were negative. -We discussed her Oncotype results, which showed recurrence score of 21.  This is considered low risk disease, adjuvant chemotherapy is not recommended. -She does not need postmastectomy -I discussed her risk of recurrence, and recommended her to consider adjuvant antiestrogen therapy.  I discussed tamoxifen and AI, and recommended her to try AI first. -The potential benefit and side effects, which includes but not limited to, hot flash, skin and vaginal dryness, metabolic changes ( increased blood glucose, cholesterol, weight, etc.), slightly in increased risk of cardiovascular disease, cataracts, muscular and joint discomfort, osteopenia and osteoporosis, etc, were discussed with her in great details. She is interested, and she will start next month, after she recovers better from surgery. -We  discussed cancer surveillance, and what to watch at home.    PLAN: -Recommend adjuvant Antiestrogen therapy -No chemo/ radiation needed -Order Bone Density for baseline in 09/2022 -Discuss different antiestrogen therapy and its side effects -Pt agree to start anastrozole for 5 years in 09/2022, she will start next month -lab and f/u in 3 months for Survivorship w/ NP Lacie  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, estrogen receptor positive (HCC)  05/14/2022 Cancer Staging   Staging form: Breast, AJCC 8th Edition - Clinical stage from 05/14/2022: Stage 0 (cTis (DCIS), cN0, cM0, G2, ER+, PR+, HER2: Not Assessed) - Signed by Malachy Mood, MD on 05/27/2022 Stage prefix: Initial diagnosis Histologic grading system: 3 grade system   05/26/2022 Initial Diagnosis   Ductal carcinoma in situ (DCIS) of right breast   07/31/2022 Cancer Staging   Staging form: Breast, AJCC 8th Edition - Pathologic stage from 07/31/2022: Stage IA (pT1b, pN0, cM0, G2, ER+, PR+, HER2-) - Signed by Malachy Mood, MD on 08/26/2022 Stage prefix: Initial diagnosis Histologic grading system: 3 grade system      INTERVAL HISTORY: Meghan Mcdonald is here for a follow up of Ductal carcinoma in situ (DCIS) of right breast . She was last seen by me on 05/28/2022. She presents to the clinic alone. Pt had a RT Breast  mastectomy  on 4/11 it was change from a Lumpectomy, and she had a tissue expander and Flex HD.     All other systems were reviewed with the patient and are negative.  MEDICAL HISTORY:  Past Medical History:  Diagnosis Date    Anxiety    Breast cancer (HCC)    Chronic shoulder pain    Depression    Diabetes mellitus without complication (HCC)    GERD (gastroesophageal reflux disease)    History of kidney stones    Hyperlipemia    Hypertension    Insomnia    Lichen sclerosus of vulva    Migraine    Overweight    Rapid palpitations    Ventricular tachycardia (paroxysmal) (HCC)     SURGICAL HISTORY: Past Surgical History:  Procedure Laterality Date   BLADDER SURGERY     BREAST RECONSTRUCTION WITH PLACEMENT OF TISSUE EXPANDER AND FLEX HD (ACELLULAR HYDRATED DERMIS) Right 07/31/2022   Procedure: IMMEDIATE RIGHT BREAST RECONSTRUCTION WITH PLACEMENT OF TISSUE EXPANDER AND FLEX HD (ACELLULAR HYDRATED DERMIS);  Surgeon: Peggye Form, DO;  Location: [facility];  Service: Plastics;  Laterality: Right;   CARPAL TUNNEL RELEASE     CATARACT EXTRACTION Bilateral    CHOLECYSTECTOMY     MASTECTOMY W/ SENTINEL NODE BIOPSY Right 07/31/2022   Procedure: RIGHT MASTECTOMY WITH SENTINEL LYMPH NODE BIOPSY;  Surgeon: Griselda Miner, MD;  Location: [facility];  Service: General;  Laterality: Right;   ORIF ANKLE FRACTURE Right 07/22/2018   Procedure: OPEN REDUCTION INTERNAL FIXATION (ORIF) RIGHT BIMALL ANKLE FRACTURE;  Surgeon: Terance Hart, MD;  Location: [facility];  Service: Orthopedics;  Laterality: Right;   ORIF TIBIA & FIBULA FRACTURES     TONSILLECTOMY      I have reviewed the social history and family history with the patient and they are unchanged from previous note.  ALLERGIES:  is allergic to hibiclens [chlorhexidine gluconate], prednisone, aspirin, micardis [telmisartan], amitriptyline, and loestrin [norethindrone acet-ethinyl est].  MEDICATIONS:  Current Outpatient Medications  Medication Sig Dispense Refill   anastrozole (ARIMIDEX) 1 MG tablet Take 1 tablet (1 mg total) by mouth daily. 30 tablet 3   acetaminophen (TYLENOL) 500 MG tablet Take 1,000  mg by mouth as needed for moderate pain.     atorvastatin (LIPITOR) 80 MG tablet Take 1 tablet (80 mg total) by mouth daily at 6 PM. 30 tablet 0   diazepam (VALIUM) 5 MG tablet Take 1 tablet (5 mg total) by mouth every 8 (eight) hours as needed (nausea and dizziness). 15 tablet 0   empagliflozin (JARDIANCE) 10 MG TABS tablet Take 10 mg by mouth daily.     FAMOTIDINE PO Take by mouth.     hydrochlorothiazide (HYDRODIURIL) 25 MG tablet Take 25 mg by mouth daily.     meclizine (ANTIVERT) 25 MG tablet Take 2 tablets (50 mg total) by mouth 3 (three) times daily. 30 tablet 0   meloxicam (MOBIC) 15 MG tablet      METFORMIN HCL ER PO Take by mouth.     methocarbamol (ROBAXIN) 500 MG tablet Take 1 tablet (500 mg total) by mouth 2 (two) times daily as needed for muscle spasms. 20 tablet 0   metoprolol tartrate (LOPRESSOR) 50 MG tablet Take 50 mg by mouth 2 (two) times daily.     Multiple Vitamins-Minerals (PRESERVISION AREDS 2+MULTI VIT PO) Take by mouth.     Omega-3 Fatty Acids (FISH OIL) 1000 MG CAPS Take by mouth.  ondansetron (ZOFRAN-ODT) 4 MG disintegrating tablet Take 1 tablet (4 mg total) by mouth every 8 (eight) hours as needed for nausea or vomiting. 20 tablet 0   ramipril (ALTACE) 10 MG capsule Take 10 mg by mouth 2 (two) times daily.     No current facility-administered medications for this visit.    PHYSICAL EXAMINATION: ECOG PERFORMANCE STATUS: 1 - Symptomatic but completely ambulatory  Vitals:   08/26/22 1212  BP: 123/67  Pulse: 62  Resp: 17  Temp: 97.9 F (36.6 C)  SpO2: 98%   Wt Readings from Last 3 Encounters:  08/26/22 145 lb (65.8 kg)  08/25/22 145 lb (65.8 kg)  08/08/22 140 lb (63.5 kg)     GENERAL:alert, no distress and comfortable SKIN: skin color normal, no rashes or significant lesions EYES: normal, Conjunctiva are pink and non-injected, sclera clear  NEURO: alert & oriented x 3 with fluent speech LABORATORY DATA:  I have reviewed the data as listed     Latest Ref Rng & Units 05/28/2022   11:49 AM 12/24/2017    4:34 AM 12/23/2017    5:34 AM  CBC  WBC 4.0 - 10.5 K/uL 7.1  5.4  5.5   Hemoglobin 12.0 - 15.0 g/dL 16.1  09.6  04.5   Hematocrit 36.0 - 46.0 % 48.3  43.2  43.9   Platelets 150 - 400 K/uL 195  194  186         Latest Ref Rng & Units 07/28/2022    2:30 PM 05/28/2022   11:49 AM 07/19/2018   12:22 PM  CMP  Glucose 70 - 99 mg/dL 409  811  914   BUN 8 - 23 mg/dL 11  15  9    Creatinine 0.44 - 1.00 mg/dL 7.82  9.56  2.13   Sodium 135 - 145 mmol/L 137  139  134   Potassium 3.5 - 5.1 mmol/L 3.4  3.4  3.7   Chloride 98 - 111 mmol/L 100  100  100   CO2 22 - 32 mmol/L 25  34  26   Calcium 8.9 - 10.3 mg/dL 9.3  08.6  9.3   Total Protein 6.5 - 8.1 g/dL  7.0    Total Bilirubin 0.3 - 1.2 mg/dL  1.0    Alkaline Phos 38 - 126 U/L  80    AST 15 - 41 U/L  48    ALT 0 - 44 U/L  72        RADIOGRAPHIC STUDIES: I have personally reviewed the radiological images as listed and agreed with the findings in the report. No results found.    Orders Placed This Encounter  Procedures   DG Bone Density    Standing Status:   Future    Standing Expiration Date:   08/26/2023    Order Specific Question:   Reason for Exam (SYMPTOM  OR DIAGNOSIS REQUIRED)    Answer:   screen for Baseline    Order Specific Question:   Preferred imaging location?    Answer:   External    Order Specific Question:   Release to patient    Answer:   Immediate   All questions were answered. The patient knows to call the clinic with any problems, questions or concerns. No barriers to learning was detected. The total time spent in the appointment was 30 minutes.     Malachy Mood, MD 08/26/2022   Carolin Coy, CMA, am acting as scribe for Malachy Mood, MD.   I have  reviewed the above documentation for accuracy and completeness, and I agree with the above.

## 2022-08-27 NOTE — Addendum Note (Signed)
Encounter addended by: Pamelia Hoit on: 08/27/2022 7:11 AM  Actions taken: Imaging Exam ended

## 2022-08-27 NOTE — Addendum Note (Signed)
Encounter addended by: Nneoma Harral C on: 08/27/2022 7:11 AM  Actions taken: Imaging Exam ended

## 2022-08-28 ENCOUNTER — Encounter: Payer: Self-pay | Admitting: *Deleted

## 2022-08-28 DIAGNOSIS — Z170 Estrogen receptor positive status [ER+]: Secondary | ICD-10-CM

## 2022-08-29 ENCOUNTER — Ambulatory Visit (INDEPENDENT_AMBULATORY_CARE_PROVIDER_SITE_OTHER): Payer: 59 | Admitting: Surgical

## 2022-08-29 ENCOUNTER — Encounter: Payer: Self-pay | Admitting: Surgical

## 2022-08-29 VITALS — BP 148/85 | HR 66

## 2022-08-29 DIAGNOSIS — C50411 Malignant neoplasm of upper-outer quadrant of right female breast: Secondary | ICD-10-CM

## 2022-08-29 DIAGNOSIS — Z9889 Other specified postprocedural states: Secondary | ICD-10-CM

## 2022-08-29 NOTE — Progress Notes (Signed)
Patient is a 67 year old female here for follow-up on her right breast reconstruction.  She underwent a right mastectomy with immediate right breast reconstruction placement tissue expander and Flex HD.  Postoperatively she did develop some skin breakdown at the center portion of the breast along the inferior mastectomy flap.  At her last appointment she had fluid removed from her expander to decrease tension on the incision.  She reports today that she has been doing Vaseline to the right breast wound.   She is here with her husband.  Chaperone present on exam On exam right mastectomy wound is noted, thickened exudate is noted along the entirety of the wound with some developing eschar medially.  There is no subcutaneous fluid collection noted.  I do not appreciate any erythema or cellulitic changes.  She has some mild tenderness noted with palpation.  Mild incisional dehiscence noted along the superior aspect of the wound.  A/P:  Recommend continuing with Vaseline and gauze 1-2 times daily to right breast wound.  Discussed with patient that I do not feel comfortable filling the expander more at this time due to the wound and the mild incisional dehiscence noted.  Discussed with patient that we will plan to see her back in 2 weeks for reevaluation, we may be able to fill at that time, however this is dependent on how the wound continues to progress.  We discussed that once we can begin expanding, we would expand until she is comfortable with the size of the breast.  We discussed options for left breast lift for symmetry in the future as well.  All of her questions and her husband's questions were answered to their content.  Pictures were obtained of the patient and placed in the chart with the patient's or guardian's permission.

## 2022-09-04 ENCOUNTER — Encounter: Payer: Self-pay | Admitting: Hematology

## 2022-09-04 ENCOUNTER — Ambulatory Visit: Payer: Self-pay | Admitting: Physical Therapy

## 2022-09-08 NOTE — Therapy (Signed)
OUTPATIENT PHYSICAL THERAPY BREAST CANCER POST OP FOLLOW UP   Patient Name: Meghan Mcdonald MRN: [id number] DOB:21-Sep-1955, 67 y.o., female Today's Date: 09/09/2022  END OF SESSION:  PT End of Session - 09/09/22 0804     Visit Number 3    Number of Visits 11    Date for PT Re-Evaluation 10/07/22    PT Start Time 0803    PT Stop Time 0848    PT Time Calculation (min) 45 min    Activity Tolerance Patient tolerated treatment well    Behavior During Therapy WFL for tasks assessed/performed             Past Medical History:  Diagnosis Date   Anxiety    Breast cancer (HCC)    Chronic shoulder pain    Depression    Diabetes mellitus without complication (HCC)    GERD (gastroesophageal reflux disease)    History of kidney stones    Hyperlipemia    Hypertension    Insomnia    Lichen sclerosus of vulva    Migraine    Overweight    Rapid palpitations    Ventricular tachycardia (paroxysmal) (HCC)    Past Surgical History:  Procedure Laterality Date   BLADDER SURGERY     BREAST RECONSTRUCTION WITH PLACEMENT OF TISSUE EXPANDER AND FLEX HD (ACELLULAR HYDRATED DERMIS) Right 07/31/2022   Procedure: IMMEDIATE RIGHT BREAST RECONSTRUCTION WITH PLACEMENT OF TISSUE EXPANDER AND FLEX HD (ACELLULAR HYDRATED DERMIS);  Surgeon: Peggye Form, DO;  Location: [facility];  Service: Plastics;  Laterality: Right;   CARPAL TUNNEL RELEASE     CATARACT EXTRACTION Bilateral    CHOLECYSTECTOMY     MASTECTOMY W/ SENTINEL NODE BIOPSY Right 07/31/2022   Procedure: RIGHT MASTECTOMY WITH SENTINEL LYMPH NODE BIOPSY;  Surgeon: Griselda Miner, MD;  Location: [facility];  Service: General;  Laterality: Right;   ORIF ANKLE FRACTURE Right 07/22/2018   Procedure: OPEN REDUCTION INTERNAL FIXATION (ORIF) RIGHT BIMALL ANKLE FRACTURE;  Surgeon: Terance Hart, MD;  Location: [facility];  Service: Orthopedics;  Laterality: Right;   ORIF TIBIA & FIBULA FRACTURES      TONSILLECTOMY     Patient Active Problem List   Diagnosis Date Noted   Malignant neoplasm of right breast in female, estrogen receptor positive (HCC) 08/11/2022   Breast cancer (HCC) 07/31/2022   S/P breast reconstruction 07/31/2022   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, estrogen receptor positive (HCC) 05/26/2022   Acute CVA (cerebrovascular accident) (HCC) 12/23/2017   Severe vertigo 12/22/2017   Vertigo 12/22/2017   Dyslipidemia 12/22/2017   Essential hypertension 12/22/2017   Nausea & vomiting 12/22/2017    PCP: Tera Helper, PA-C  REFERRING PROVIDER: Malachy Mood, MD   REFERRING DIAG: D05.11 (ICD-10-CM) - Ductal carcinoma in situ (DCIS) of right breast   THERAPY DIAG:  Abnormal posture  Stiffness of right shoulder, not elsewhere classified  Aftercare following surgery for neoplasm  Dizziness and giddiness  Ductal carcinoma in situ (DCIS) of right breast  Rationale for Evaluation and Treatment: Rehabilitation  ONSET DATE: 05/14/22  SUBJECTIVE:  SUBJECTIVE STATEMENT: I am having trouble healing on the R side. There is an area where the skin broke down. It is healing but slowly. I got my drain out 2 weeks ago (May 6th). I thought I was having some swelling in my armpit but it seems to be gone.  I have tightness in my armpit when I raise my R arm.   PERTINENT HISTORY:  Patient was diagnosed on 05/14/22 with R breast cancer. She had a total of 3 biopsies: 1 on 05/14/22: ER+ PR+ DCIS, 05/21/22: DCIS 2/5 mm ER/PR+, 06/11/22: DCIS grade 2, ER+, PR- Ki67: 20%, Hx of diabetes, hypertension. 07/31/22- R mastectomy SLNB 0/2. Had placement of expanders  PATIENT GOALS:  Reassess how my recovery is going related to arm function, pain, and swelling.  PAIN:  Are you having  pain? No just soreness   PRECAUTIONS: Recent Surgery, right UE Lymphedema risk, pins and plates in R ankle  ACTIVITY LEVEL / LEISURE: pt has been planting the garden and pulling weeds   OBJECTIVE:   PATIENT SURVEYS:  QUICK DASH:  Quick Dash - 09/09/22 0001     Open a tight or new jar Mild difficulty    Do heavy household chores (wash walls, wash floors) Mild difficulty    Carry a shopping bag or briefcase No difficulty    Wash your back No difficulty    Use a knife to cut food No difficulty    Recreational activities in which you take some force or impact through your arm, shoulder, or hand (golf, hammering, tennis) Mild difficulty    During the past week, to what extent has your arm, shoulder or hand problem interfered with your normal social activities with family, friends, neighbors, or groups? Not at all    During the past week, to what extent has your arm, shoulder or hand problem limited your work or other regular daily activities Slightly    Arm, shoulder, or hand pain. Mild    Tingling (pins and needles) in your arm, shoulder, or hand None    Difficulty Sleeping No difficulty    DASH Score 11.36 %              OBSERVATIONS: Quarter size area in middle of incision is still healing with yellow exudate - covered with a bandage, area of redness towards medial breast that pt reports is itchy - she does report that this area has gotten smaller since yesterday  POSTURE:  Forward head and rounded shoulders posture   UPPER EXTREMITY AROM/PROM:   A/PROM RIGHT   eval   RIGHT 09/09/22  Shoulder extension 66 72  Shoulder flexion 165 155  Shoulder abduction 179 127  Shoulder internal rotation 62 65  Shoulder external rotation 85 80                          (Blank rows = not tested)   A/PROM LEFT   eval  Shoulder extension 66  Shoulder flexion 162  Shoulder abduction 180  Shoulder internal rotation 55  Shoulder external rotation 83                          (Blank  rows = not tested)   CERVICAL AROM: All within normal limits:      Percent limited  Flexion WFL  Extension WFL  Right lateral flexion WFL  Left lateral flexion WFL  Right rotation [facility]  Left rotation [facility]  UPPER EXTREMITY STRENGTH: 4+/5   LYMPHEDEMA ASSESSMENTS:    LANDMARK RIGHT   eval RIGHT  09/09/22  10 cm proximal to olecranon process 26.7 25.5  Olecranon process 24.5 24  10  cm proximal to ulnar styloid process 21.7 20.9  Just proximal to ulnar styloid process 15.2 15  Across hand at thumb web space 19.7 18.4  At base of 2nd digit 6.4 6.2  (Blank rows = not tested)   LANDMARK LEFT   eval  10 cm proximal to olecranon process 25.3  Olecranon process 24.3  10 cm proximal to ulnar styloid process 21.1  Just proximal to ulnar styloid process 15.4  Across hand at thumb web space 18.5  At base of 2nd digit 6.2  (Blank rows = not tested)    Surgery type/Date: 07/31/22 R mastectomy and SLNB Number of lymph nodes removed: 0/2 Current/past treatment (chemo, radiation, hormone therapy): pt will begin anastrozole in June - will not need chemo or radiation Other symptoms:  Heaviness/tightness Yes Pain No Pitting edema No Infections No Decreased scar mobility: still healing Stemmer sign No  PATIENT EDUCATION:  Education details: post op exercises Person educated: Patient Education method: Explanation and Handouts Education comprehension: verbalized understanding  HOME EXERCISE PROGRAM: Reviewed previously given post op HEP.   ASSESSMENT:  CLINICAL IMPRESSION: Pt returns to PT after undergoing a R mastectomy and SLNB with immediate expander placement on 07/31/22. She has had difficulty healing. Her incision opened in the medial portion and it is still healing with a quarter size opening. She has decreased R shoulder ROM compared to baseline with R shoulder abduction significantly decreased. She reports her vertigo has also returned. She has a history of BPPV. Pt  would benefit from skilled PT services to improve R shoulder ROM and decrease symptoms of vertigo.   Pt will benefit from skilled therapeutic intervention to improve on the following deficits: Decreased knowledge of precautions, impaired UE functional use, pain, decreased ROM, postural dysfunction, increased dizziness  PT treatment/interventions: ADL/Self care home management, Therapeutic exercises, Therapeutic activity, Patient/Family education, Self Care, Joint mobilization, Canalith repositioning, Orthotic/Fit training, Manual lymph drainage, Compression bandaging, scar mobilization, Taping, Vasopneumatic device, Manual therapy, and Re-evaluation   GOALS: Goals reviewed with patient? Yes  LONG TERM GOALS:  (STG=LTG)  GOALS Name Target Date  Goal status  1 Pt will demonstrate she has regained full shoulder ROM and function post operatively compared to baselines.  Baseline: 10/07/22 NEW  2 Pt will demonstrate 170 degrees of R shoulder abduction to allow her to reach out to the side.  10/07/22 NEW  3 Pt will demonstrate 165 degrees of R shoulder flexion to allow her to reach overhead.  10/07/22 NEW  4 Pt will report she has had a 75% improvement in her vertigo symptoms to allow her to look upwards without getting dizzy.  10/07/22 NEW  5 Pt will be independent in a home exercise program for continued stretching and strengthening.  10/07/22 NEW     PLAN:  PT FREQUENCY/DURATION: 2x/wk for 4 wks  PLAN FOR NEXT SESSION: canalith repositioning, R UE ROM, begin pulleys and ball   [facility]  [address], [address]  [place] [address]  [phone]  After Breast Cancer Class It is recommended you attend the ABC class to be educated on lymphedema risk reduction. This class is free of charge and lasts for 1 hour. It is a 1-time class. You will need to download the TEAMS app either on your phone or  computer. We will send you a link the night before or the morning of the  class. You should be able to click on that link to join the class. This is not a confidential class. You don't have to turn your camera on, but other participants may be able to see your email address.  Scar massage You can begin gentle scar massage to you incision sites. Gently place one hand on the incision and move the skin (without sliding on the skin) in various directions. Do this for a few minutes and then you can gently massage either coconut oil or vitamin E cream into the scars.  Compression garment You should continue wearing your compression bra until you feel like you no longer have swelling.  Home exercise Program Continue doing the exercises you were given until you feel like you can do them without feeling any tightness at the end.   Walking Program Studies show that 30 minutes of walking per day (fast enough to elevate your heart rate) can significantly reduce the risk of a cancer recurrence. If you can't walk due to other medical reasons, we encourage you to find another activity you could do (like a stationary bike or water exercise).  Posture After breast cancer surgery, people frequently sit with rounded shoulders posture because it puts their incisions on slack and feels better. If you sit like this and scar tissue forms in that position, you can become very tight and have pain sitting or standing with good posture. Try to be aware of your posture and sit and stand up tall to heal properly.  Follow up PT: It is recommended you return every 3 months for the first 3 years following surgery to be assessed on the SOZO machine for an L-Dex score. This helps prevent clinically significant lymphedema in 95% of patients. These follow up screens are 10 minute appointments that you are not billed for.  [facility] [place], PT 09/09/2022, 9:03 AM

## 2022-09-09 ENCOUNTER — Ambulatory Visit: Payer: 59 | Attending: Hematology | Admitting: Physical Therapy

## 2022-09-09 ENCOUNTER — Encounter: Payer: Self-pay | Admitting: Physical Therapy

## 2022-09-09 DIAGNOSIS — Z483 Aftercare following surgery for neoplasm: Secondary | ICD-10-CM | POA: Insufficient documentation

## 2022-09-09 DIAGNOSIS — N951 Menopausal and female climacteric states: Secondary | ICD-10-CM | POA: Diagnosis not present

## 2022-09-09 DIAGNOSIS — M25611 Stiffness of right shoulder, not elsewhere classified: Secondary | ICD-10-CM | POA: Diagnosis not present

## 2022-09-09 DIAGNOSIS — R293 Abnormal posture: Secondary | ICD-10-CM | POA: Insufficient documentation

## 2022-09-09 DIAGNOSIS — Z853 Personal history of malignant neoplasm of breast: Secondary | ICD-10-CM | POA: Diagnosis not present

## 2022-09-09 DIAGNOSIS — R42 Dizziness and giddiness: Secondary | ICD-10-CM | POA: Diagnosis not present

## 2022-09-09 DIAGNOSIS — D0511 Intraductal carcinoma in situ of right breast: Secondary | ICD-10-CM | POA: Diagnosis not present

## 2022-09-09 DIAGNOSIS — E349 Endocrine disorder, unspecified: Secondary | ICD-10-CM | POA: Diagnosis not present

## 2022-09-11 ENCOUNTER — Encounter: Payer: Self-pay | Admitting: Hematology

## 2022-09-12 ENCOUNTER — Ambulatory Visit (INDEPENDENT_AMBULATORY_CARE_PROVIDER_SITE_OTHER): Payer: 59 | Admitting: Surgical

## 2022-09-12 VITALS — BP 136/81 | HR 64

## 2022-09-12 DIAGNOSIS — D0511 Intraductal carcinoma in situ of right breast: Secondary | ICD-10-CM

## 2022-09-12 DIAGNOSIS — Z9889 Other specified postprocedural states: Secondary | ICD-10-CM

## 2022-09-12 DIAGNOSIS — Z170 Estrogen receptor positive status [ER+]: Secondary | ICD-10-CM

## 2022-09-12 MED ORDER — SULFAMETHOXAZOLE-TRIMETHOPRIM 800-160 MG PO TABS: 1.0000 | ORAL_TABLET | Freq: Two times a day (BID) | ORAL | 0 refills | Status: AC

## 2022-09-12 NOTE — Progress Notes (Signed)
Patient is a 67 year old female here for follow-up on her right breast reconstruction.  She underwent right mastectomy with immediate breast reconstruction placement tissue expander and Flex HD with Dr. Ulice Bold.  Postoperatively she did develop skin breakdown and skin necrosis along the inferior mastectomy flap.  She reports today that she is overall doing well.  She is not having any infectious symptoms.  She has been using Vaseline and Vashe to the right breast wound.  She had a 535 cc expander placed, she had 150 cc placed at the time of surgery then a total of 180 by April 30.  50 cc was removed at her appointment on May 3 and now has 130 cc in the expander.  Chaperone present on exam On exam patient has a right breast wound which has been present since surgery.  She has surrounding erythema, skin thickening and some potential cellulitic changes noted inferior and medially.  There is a lot of firmness of the right breast.  She does not have any tenderness, but reports it is not over the entire breast.  The wound has improved in size, 100% of the wound bed is fibrinous exudate.  A/P:  Patient with erythema and mild cellulitic changes of the right inferior medial breast, 30 cc of dark yellow/serous fluid was did from the right breast.  I also removed 30 cc from the expander due to the tightness of the right breast incision.  I would like her to start Bactrim today and stay on this throughout the weekend, would like her to come for close follow-up next week on Tuesday to reevaluate the area.  We did discuss that she may need to return to the operating room for washout of the right breast, removal or replacement of the tissue expander.  We will plan to evaluate this on Tuesday and discuss if any improvement is noticed on the antibiotics.  Pictures were obtained of the patient and placed in the chart with the patient's or guardian's permission.  She is not having any infectious symptoms at this  time, however she was recommended to notify our office over the weekend if she starts developing any infectious symptoms.  We placed injectable saline in the Expander using a sterile technique: Right: -30 cc for a total of 100 / 535 cc

## 2022-09-16 ENCOUNTER — Encounter: Payer: 59 | Admitting: Surgical

## 2022-09-17 ENCOUNTER — Ambulatory Visit: Payer: 59 | Admitting: Rehabilitation

## 2022-09-17 ENCOUNTER — Encounter: Payer: Self-pay | Admitting: Surgical

## 2022-09-17 ENCOUNTER — Ambulatory Visit (INDEPENDENT_AMBULATORY_CARE_PROVIDER_SITE_OTHER): Payer: 59 | Admitting: Surgical

## 2022-09-17 ENCOUNTER — Encounter: Payer: Self-pay | Admitting: Rehabilitation

## 2022-09-17 VITALS — BP 105/68 | HR 72

## 2022-09-17 DIAGNOSIS — D0511 Intraductal carcinoma in situ of right breast: Secondary | ICD-10-CM | POA: Diagnosis not present

## 2022-09-17 DIAGNOSIS — E785 Hyperlipidemia, unspecified: Secondary | ICD-10-CM | POA: Diagnosis not present

## 2022-09-17 DIAGNOSIS — Z170 Estrogen receptor positive status [ER+]: Secondary | ICD-10-CM | POA: Diagnosis not present

## 2022-09-17 DIAGNOSIS — R42 Dizziness and giddiness: Secondary | ICD-10-CM | POA: Diagnosis not present

## 2022-09-17 DIAGNOSIS — Z7984 Long term (current) use of oral hypoglycemic drugs: Secondary | ICD-10-CM | POA: Diagnosis not present

## 2022-09-17 DIAGNOSIS — M25611 Stiffness of right shoulder, not elsewhere classified: Secondary | ICD-10-CM

## 2022-09-17 DIAGNOSIS — Z87442 Personal history of urinary calculi: Secondary | ICD-10-CM | POA: Diagnosis not present

## 2022-09-17 DIAGNOSIS — R293 Abnormal posture: Secondary | ICD-10-CM

## 2022-09-17 DIAGNOSIS — I10 Essential (primary) hypertension: Secondary | ICD-10-CM | POA: Diagnosis not present

## 2022-09-17 DIAGNOSIS — Z79811 Long term (current) use of aromatase inhibitors: Secondary | ICD-10-CM | POA: Diagnosis not present

## 2022-09-17 DIAGNOSIS — E119 Type 2 diabetes mellitus without complications: Secondary | ICD-10-CM | POA: Diagnosis not present

## 2022-09-17 DIAGNOSIS — Z483 Aftercare following surgery for neoplasm: Secondary | ICD-10-CM

## 2022-09-17 DIAGNOSIS — K219 Gastro-esophageal reflux disease without esophagitis: Secondary | ICD-10-CM | POA: Diagnosis not present

## 2022-09-17 DIAGNOSIS — Z9889 Other specified postprocedural states: Secondary | ICD-10-CM

## 2022-09-17 DIAGNOSIS — Z79899 Other long term (current) drug therapy: Secondary | ICD-10-CM | POA: Diagnosis not present

## 2022-09-17 MED ORDER — SULFAMETHOXAZOLE-TRIMETHOPRIM 800-160 MG PO TABS: 1.0000 | ORAL_TABLET | Freq: Two times a day (BID) | ORAL | 0 refills | Status: AC

## 2022-09-17 NOTE — Progress Notes (Signed)
Patient is a 67 year old female here for follow-up on her right breast reconstruction.  She underwent right mastectomy with immediate breast reconstruction and placement of tissue expander and Flex HD with Dr. Ulice Bold on 07/31/2022.  Postoperatively she did develop some skin breakdown and skin necrosis along the inferior mastectomy flap.  She was last seen in the office on 09/12/2022, at that time she had some increased redness and firmness to the right breast, fluid was removed from the expander and serosanguineous fluid was aspirated from the right breast.  She was placed on Bactrim for total of 7 days.  She presents today and reports that she is overall continuing to feel well, she does report that her appetite has not been, but reports this has been occurring since surgery.  She reports she is still continuing to eat and drink fluids despite not being hungry.  She is not having any infectious symptoms.   Chaperone present on exam On exam right breast wound is present, the entire wound is fibrinous exudate.  She does have erythema extending inferior and medially.  She does have a recurrent subcutaneous fluid collection noted palpation.  There is no active drainage from the right breast wound.  There is no foul odor is noted on exam.  She has minimal tenderness with palpation.   A/P:  Patient was evaluated by myself and Dr. Ulice Bold.  30 cc of cloudy serosanguineous fluid was aspirated from the right breast.  50 cc of fluid was removed from the right breast tissue expander.  Patient tolerated this well, sterile technique was used.  The fluid aspirated from the right breast will be sent for culture today.  We placed injectable saline in the Expander using a sterile technique: Right: -50 cc for a total of 50 / 535 cc  We discussed returning to the operating room for evaluation of the right breast, removal of right breast tissue expander, possible replacement, possible removal of right breast Flex  HD, excision of right breast wound and possible primary closure.  We discussed the risks of surgery, all of her questions were answered to her content.  She reports that she does not have any history of cardiac or pulmonary disease.  She reports she is not taking any blood thinners.  She tolerated the last surgery well without any complications.

## 2022-09-17 NOTE — H&P (View-Only) (Signed)
Patient is a 66-year-old female here for follow-up on her right breast reconstruction.  She underwent right mastectomy with immediate breast reconstruction and placement of tissue expander and Flex HD with Dr. Dillingham on 07/31/2022.  Postoperatively she did develop some skin breakdown and skin necrosis along the inferior mastectomy flap.  She was last seen in the office on 09/12/2022, at that time she had some increased redness and firmness to the right breast, fluid was removed from the expander and serosanguineous fluid was aspirated from the right breast.  She was placed on Bactrim for total of 7 days.  She presents today and reports that she is overall continuing to feel well, she does report that her appetite has not been, but reports this has been occurring since surgery.  She reports she is still continuing to eat and drink fluids despite not being hungry.  She is not having any infectious symptoms.   Chaperone present on exam On exam right breast wound is present, the entire wound is fibrinous exudate.  She does have erythema extending inferior and medially.  She does have a recurrent subcutaneous fluid collection noted palpation.  There is no active drainage from the right breast wound.  There is no foul odor is noted on exam.  She has minimal tenderness with palpation.   A/P:  Patient was evaluated by myself and Dr. Dillingham.  30 cc of cloudy serosanguineous fluid was aspirated from the right breast.  50 cc of fluid was removed from the right breast tissue expander.  Patient tolerated this well, sterile technique was used.  The fluid aspirated from the right breast will be sent for culture today.  We placed injectable saline in the Expander using a sterile technique: Right: -50 cc for a total of 50 / 535 cc  We discussed returning to the operating room for evaluation of the right breast, removal of right breast tissue expander, possible replacement, possible removal of right breast Flex  HD, excision of right breast wound and possible primary closure.  We discussed the risks of surgery, all of her questions were answered to her content.  She reports that she does not have any history of cardiac or pulmonary disease.  She reports she is not taking any blood thinners.  She tolerated the last surgery well without any complications.   

## 2022-09-17 NOTE — Therapy (Signed)
OUTPATIENT PHYSICAL THERAPY BREAST CANCER POST OP FOLLOW UP   Patient Name: Meghan Mcdonald MRN: [id number] DOB:1955/12/11, 67 y.o., female Today's Date: 09/17/2022  END OF SESSION:  PT End of Session - 09/17/22 1151     Visit Number 4    Number of Visits 11    Date for PT Re-Evaluation 10/07/22    PT Start Time 1200    PT Stop Time 1234    PT Time Calculation (min) 34 min    Activity Tolerance Patient tolerated treatment well    Behavior During Therapy WFL for tasks assessed/performed             Past Medical History:  Diagnosis Date   Anxiety    Breast cancer (HCC)    Chronic shoulder pain    Depression    Diabetes mellitus without complication (HCC)    GERD (gastroesophageal reflux disease)    History of kidney stones    Hyperlipemia    Hypertension    Insomnia    Lichen sclerosus of vulva    Migraine    Overweight    Rapid palpitations    Ventricular tachycardia (paroxysmal) (HCC)    Past Surgical History:  Procedure Laterality Date   BLADDER SURGERY     BREAST RECONSTRUCTION WITH PLACEMENT OF TISSUE EXPANDER AND FLEX HD (ACELLULAR HYDRATED DERMIS) Right 07/31/2022   Procedure: IMMEDIATE RIGHT BREAST RECONSTRUCTION WITH PLACEMENT OF TISSUE EXPANDER AND FLEX HD (ACELLULAR HYDRATED DERMIS);  Surgeon: Peggye Form, DO;  Location: [facility];  Service: Plastics;  Laterality: Right;   CARPAL TUNNEL RELEASE     CATARACT EXTRACTION Bilateral    CHOLECYSTECTOMY     MASTECTOMY W/ SENTINEL NODE BIOPSY Right 07/31/2022   Procedure: RIGHT MASTECTOMY WITH SENTINEL LYMPH NODE BIOPSY;  Surgeon: Griselda Miner, MD;  Location: [facility];  Service: General;  Laterality: Right;   ORIF ANKLE FRACTURE Right 07/22/2018   Procedure: OPEN REDUCTION INTERNAL FIXATION (ORIF) RIGHT BIMALL ANKLE FRACTURE;  Surgeon: Terance Hart, MD;  Location: [facility];  Service: Orthopedics;  Laterality: Right;   ORIF TIBIA & FIBULA FRACTURES      TONSILLECTOMY     Patient Active Problem List   Diagnosis Date Noted   Malignant neoplasm of right breast in female, estrogen receptor positive (HCC) 08/11/2022   Breast cancer (HCC) 07/31/2022   S/P breast reconstruction 07/31/2022   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, estrogen receptor positive (HCC) 05/26/2022   Acute CVA (cerebrovascular accident) (HCC) 12/23/2017   Severe vertigo 12/22/2017   Vertigo 12/22/2017   Dyslipidemia 12/22/2017   Essential hypertension 12/22/2017   Nausea & vomiting 12/22/2017    PCP: Tera Helper, PA-C  REFERRING PROVIDER: Malachy Mood, MD   REFERRING DIAG: D05.11 (ICD-10-CM) - Ductal carcinoma in situ (DCIS) of right breast   THERAPY DIAG:  Stiffness of right shoulder, not elsewhere classified  Aftercare following surgery for neoplasm  Dizziness and giddiness  Abnormal posture  Ductal carcinoma in situ (DCIS) of right breast  Rationale for Evaluation and Treatment: Rehabilitation  ONSET DATE: 05/14/22  SUBJECTIVE:  SUBJECTIVE STATEMENT: In 2019 I had a severe episode of vertigo.  I was vomiting and in the hospital. Now I can't lay on my left side. It usually lasts about 30-40seconds.  I have an evaluation with a vestibular doctor. If I look up I feel dizzy and look over my shoulders quickly.  I even get dizzy when I'm flat in bed. My blood pressure has also been low.    PERTINENT HISTORY:  Patient was diagnosed on 05/14/22 with R breast cancer. She had a total of 3 biopsies: 1 on 05/14/22: ER+ PR+ DCIS, 05/21/22: DCIS 2/5 mm ER/PR+, 06/11/22: DCIS grade 2, ER+, PR- Ki67: 20%, Hx of diabetes, hypertension. 07/31/22- R mastectomy SLNB 0/2. Had placement of expanders  PATIENT GOALS:  Reassess how my recovery is going related to arm  function, pain, and swelling.  PAIN:  Are you having pain? No just soreness   PRECAUTIONS: Recent Surgery, right UE Lymphedema risk, pins and plates in R ankle  ACTIVITY LEVEL / LEISURE: pt has been planting the garden and pulling weeds   OBJECTIVE:   OBSERVATIONS: Quarter size area in middle of incision is still healing with yellow exudate - covered with a bandage, area of redness towards medial breast that pt reports is itchy - she does report that this area has gotten smaller since yesterday  POSTURE:  Forward head and rounded shoulders posture   UPPER EXTREMITY AROM/PROM:   A/PROM RIGHT   eval   RIGHT 09/09/22  Shoulder extension 66 72  Shoulder flexion 165 155  Shoulder abduction 179 127  Shoulder internal rotation 62 65  Shoulder external rotation 85 80                          (Blank rows = not tested)   A/PROM LEFT   eval  Shoulder extension 66  Shoulder flexion 162  Shoulder abduction 180  Shoulder internal rotation 55  Shoulder external rotation 83                          (Blank rows = not tested)   CERVICAL AROM: All within normal limits:      Percent limited  Flexion WFL  Extension WFL  Right lateral flexion WFL  Left lateral flexion WFL  Right rotation WFL  Left rotation WFL      UPPER EXTREMITY STRENGTH: 4+/5   LYMPHEDEMA ASSESSMENTS:    LANDMARK RIGHT   eval RIGHT  09/09/22  10 cm proximal to olecranon process 26.7 25.5  Olecranon process 24.5 24  10  cm proximal to ulnar styloid process 21.7 20.9  Just proximal to ulnar styloid process 15.2 15  Across hand at thumb web space 19.7 18.4  At base of 2nd digit 6.4 6.2  (Blank rows = not tested)   LANDMARK LEFT   eval  10 cm proximal to olecranon process 25.3  Olecranon process 24.3  10 cm proximal to ulnar styloid process 21.1  Just proximal to ulnar styloid process 15.4  Across hand at thumb web space 18.5  At base of 2nd digit 6.2  (Blank rows = not tested)    Surgery  type/Date: 07/31/22 R mastectomy and SLNB Number of lymph nodes removed: 0/2 Current/past treatment (chemo, radiation, hormone therapy): pt will begin anastrozole in June - will not need chemo or radiation Other symptoms:  Heaviness/tightness Yes Pain No Pitting edema No Infections No Decreased scar mobility: still healing Stemmer  sign No  TODAY"S TREATMENT VESTIBULAR ASSESSMENT:  SYMPTOM BEHAVIOR:  Subjective history: see above  Non-Vestibular symptoms: BP low, Lt ear feels a bit full  Type of dizziness: Spinning/Vertigo  Frequency: head turns especially rolling and head turns and supine to sit  Duration: 30-40sec  Progression of symptoms: worse after surgery  OCULOMOTOR EXAM:  Ocular Alignment: normal  Ocular ROM: No Limitations  Spontaneous Nystagmus: absent  Gaze-Induced Nystagmus: absent  Smooth Pursuits: intact  Saccades: intact   VESTIBULAR - OCULAR REFLEX:   Head-Impulse Test: HIT Right: negative, Left negative     POSITIONAL TESTING: DHM left negative, DHM right positive with nystagmus noted x 12 seconds - moved right into CRM and then education on doing her epley correctly and for the Rt side.    PATIENT EDUCATION:  Education details: post op exercises Person educated: Patient Education method: Chief Technology Officer Education comprehension: verbalized understanding  HOME EXERCISE PROGRAM: Reviewed previously given post op HEP.   ASSESSMENT:  CLINICAL IMPRESSION:  Performed vestibular assessment with positive Rt DHM with minor nystagmus and symptoms.  Moved right into CRM x 1 with pt reporting no vertigo upon sitting.  Will reassess on Friday.   Pt will benefit from skilled therapeutic intervention to improve on the following deficits: Decreased knowledge of precautions, impaired UE functional use, pain, decreased ROM, postural dysfunction, increased dizziness  PT treatment/interventions: ADL/Self care home management, Therapeutic exercises, Therapeutic  activity, Patient/Family education, Self Care, Joint mobilization, Canalith repositioning, Orthotic/Fit training, Manual lymph drainage, Compression bandaging, scar mobilization, Taping, Vasopneumatic device, Manual therapy, and Re-evaluation   GOALS: Goals reviewed with patient? Yes  LONG TERM GOALS:  (STG=LTG)  GOALS Name Target Date  Goal status  1 Pt will demonstrate she has regained full shoulder ROM and function post operatively compared to baselines.  Baseline: 10/07/22 NEW  2 Pt will demonstrate 170 degrees of R shoulder abduction to allow her to reach out to the side.  10/07/22 NEW  3 Pt will demonstrate 165 degrees of R shoulder flexion to allow her to reach overhead.  10/07/22 NEW  4 Pt will report she has had a 75% improvement in her vertigo symptoms to allow her to look upwards without getting dizzy.  10/07/22 NEW  5 Pt will be independent in a home exercise program for continued stretching and strengthening.  10/07/22 NEW     PLAN:  PT FREQUENCY/DURATION: 2x/wk for 4 wks  PLAN FOR NEXT SESSION: redo positional testing   [facility]  [address], [address]  [place] [address]  [phone]  After Breast Cancer Class It is recommended you attend the ABC class to be educated on lymphedema risk reduction. This class is free of charge and lasts for 1 hour. It is a 1-time class. You will need to download the TEAMS app either on your phone or computer. We will send you a link the night before or the morning of the class. You should be able to click on that link to join the class. This is not a confidential class. You don't have to turn your camera on, but other participants may be able to see your email address.  Scar massage You can begin gentle scar massage to you incision sites. Gently place one hand on the incision and move the skin (without sliding on the skin) in various directions. Do this for a few minutes and then you can gently massage either  coconut oil or vitamin E cream into the scars.  Compression  garment You should continue wearing your compression bra until you feel like you no longer have swelling.  Home exercise Program Continue doing the exercises you were given until you feel like you can do them without feeling any tightness at the end.   Walking Program Studies show that 30 minutes of walking per day (fast enough to elevate your heart rate) can significantly reduce the risk of a cancer recurrence. If you can't walk due to other medical reasons, we encourage you to find another activity you could do (like a stationary bike or water exercise).  Posture After breast cancer surgery, people frequently sit with rounded shoulders posture because it puts their incisions on slack and feels better. If you sit like this and scar tissue forms in that position, you can become very tight and have pain sitting or standing with good posture. Try to be aware of your posture and sit and stand up tall to heal properly.  Follow up PT: It is recommended you return every 3 months for the first 3 years following surgery to be assessed on the SOZO machine for an L-Dex score. This helps prevent clinically significant lymphedema in 95% of patients. These follow up screens are 10 minute appointments that you are not billed for.  Idamae Lusher, PT 09/17/2022, 12:51 PM

## 2022-09-18 ENCOUNTER — Other Ambulatory Visit: Payer: Self-pay

## 2022-09-18 ENCOUNTER — Encounter (HOSPITAL_COMMUNITY): Payer: Self-pay | Admitting: Plastic Surgery

## 2022-09-18 LAB — AEROBIC/ANAEROBIC CULTURE W GRAM STAIN (SURGICAL/DEEP WOUND)

## 2022-09-18 NOTE — Progress Notes (Signed)
Spoke with pt for pre-op call. Pt has history of "palpitations". States she takes Metoprol and hasn't had any since. Her PCP takes care of this. PCP is Tera Helper, PA at [facility].   Shower instructions given to pt. Pt states she is allergic to CHG and only uses Dove soap.

## 2022-09-19 ENCOUNTER — Encounter (HOSPITAL_COMMUNITY): Payer: Self-pay | Admitting: Plastic Surgery

## 2022-09-19 ENCOUNTER — Other Ambulatory Visit: Payer: Self-pay

## 2022-09-19 ENCOUNTER — Ambulatory Visit (HOSPITAL_BASED_OUTPATIENT_CLINIC_OR_DEPARTMENT_OTHER): Payer: 59 | Admitting: Anesthesiology

## 2022-09-19 ENCOUNTER — Ambulatory Visit (HOSPITAL_COMMUNITY): Payer: 59 | Admitting: Anesthesiology

## 2022-09-19 ENCOUNTER — Encounter (HOSPITAL_COMMUNITY)
Admission: RE | Disposition: A | Discharge status: Home / Self Care | Payer: Self-pay | Source: Home / Self Care | Attending: Plastic Surgery

## 2022-09-19 ENCOUNTER — Ambulatory Visit: Payer: 59 | Admitting: Rehabilitation

## 2022-09-19 ENCOUNTER — Ambulatory Visit (HOSPITAL_COMMUNITY)
Admission: RE | Admit: 2022-09-19 | Discharge: 2022-09-19 | Disposition: A | Discharge status: Home / Self Care | Payer: 59 | Attending: Plastic Surgery | Admitting: Plastic Surgery

## 2022-09-19 DIAGNOSIS — K219 Gastro-esophageal reflux disease without esophagitis: Secondary | ICD-10-CM | POA: Diagnosis not present

## 2022-09-19 DIAGNOSIS — F32A Depression, unspecified: Secondary | ICD-10-CM | POA: Insufficient documentation

## 2022-09-19 DIAGNOSIS — E663 Overweight: Secondary | ICD-10-CM | POA: Insufficient documentation

## 2022-09-19 DIAGNOSIS — F418 Other specified anxiety disorders: Secondary | ICD-10-CM | POA: Diagnosis not present

## 2022-09-19 DIAGNOSIS — Z6825 Body mass index (BMI) 25.0-25.9, adult: Secondary | ICD-10-CM | POA: Diagnosis not present

## 2022-09-19 DIAGNOSIS — Z853 Personal history of malignant neoplasm of breast: Secondary | ICD-10-CM | POA: Diagnosis not present

## 2022-09-19 DIAGNOSIS — Y836 Removal of other organ (partial) (total) as the cause of abnormal reaction of the patient, or of later complication, without mention of misadventure at the time of the procedure: Secondary | ICD-10-CM | POA: Diagnosis not present

## 2022-09-19 DIAGNOSIS — I10 Essential (primary) hypertension: Secondary | ICD-10-CM | POA: Diagnosis not present

## 2022-09-19 DIAGNOSIS — F419 Anxiety disorder, unspecified: Secondary | ICD-10-CM | POA: Insufficient documentation

## 2022-09-19 DIAGNOSIS — E119 Type 2 diabetes mellitus without complications: Secondary | ICD-10-CM | POA: Insufficient documentation

## 2022-09-19 DIAGNOSIS — Z79811 Long term (current) use of aromatase inhibitors: Secondary | ICD-10-CM | POA: Diagnosis not present

## 2022-09-19 DIAGNOSIS — N6489 Other specified disorders of breast: Secondary | ICD-10-CM | POA: Diagnosis not present

## 2022-09-19 DIAGNOSIS — Z79899 Other long term (current) drug therapy: Secondary | ICD-10-CM | POA: Insufficient documentation

## 2022-09-19 DIAGNOSIS — Z7984 Long term (current) use of oral hypoglycemic drugs: Secondary | ICD-10-CM | POA: Insufficient documentation

## 2022-09-19 DIAGNOSIS — L7634 Postprocedural seroma of skin and subcutaneous tissue following other procedure: Secondary | ICD-10-CM | POA: Diagnosis not present

## 2022-09-19 DIAGNOSIS — E785 Hyperlipidemia, unspecified: Secondary | ICD-10-CM | POA: Diagnosis not present

## 2022-09-19 HISTORY — DX: Anemia, unspecified: D64.9

## 2022-09-19 HISTORY — DX: Unspecified osteoarthritis, unspecified site: M19.90

## 2022-09-19 HISTORY — PX: BREAST IMPLANT REMOVAL: SHX5361

## 2022-09-19 HISTORY — DX: COVID-19: U07.1

## 2022-09-19 LAB — GLUCOSE, CAPILLARY
Glucose-Capillary: 91 mg/dL (ref 70–99)
Glucose-Capillary: 95 mg/dL (ref 70–99)

## 2022-09-19 LAB — BASIC METABOLIC PANEL
Anion gap: 11 (ref 5–15)
BUN: 10 mg/dL (ref 8–23)
CO2: 22 mmol/L (ref 22–32)
Calcium: 8.8 mg/dL — ABNORMAL LOW (ref 8.9–10.3)
Chloride: 99 mmol/L (ref 98–111)
Creatinine, Ser: 0.83 mg/dL (ref 0.44–1.00)
GFR, Estimated: >60 mL/min (ref >60)
Glucose, Bld: 104 mg/dL — ABNORMAL HIGH (ref 70–99)
Potassium: 3.6 mmol/L (ref 3.5–5.1)
Sodium: 132 mmol/L — ABNORMAL LOW (ref 135–145)

## 2022-09-19 LAB — CBC
HCT: 44 % (ref 36.0–46.0)
Hemoglobin: 14.1 g/dL (ref 12.0–15.0)
MCH: 29.1 pg (ref 26.0–34.0)
MCHC: 32 g/dL (ref 30.0–36.0)
MCV: 90.7 fL (ref 80.0–100.0)
Platelets: 254 10*3/uL (ref 150–400)
RBC: 4.85 MIL/uL (ref 3.87–5.11)
RDW: 13.6 % (ref 11.5–15.5)
WBC: 5.6 10*3/uL (ref 4.0–10.5)
nRBC: 0 % (ref 0.0–0.2)

## 2022-09-19 SURGERY — REMOVAL, IMPLANT, BREAST
Anesthesia: General | Site: Breast | Laterality: Right

## 2022-09-19 MED ORDER — CHLORHEXIDINE GLUCONATE 0.12 % MT SOLN: 15.0000 mL | Freq: Once | OROMUCOSAL | Status: AC

## 2022-09-19 MED ORDER — 0.9 % SODIUM CHLORIDE (POUR BTL) OPTIME
TOPICAL | Status: DC | PRN
  Administered 2022-09-19: 5 mL

## 2022-09-19 MED ORDER — LIDOCAINE HCL (PF) 2 % IJ SOLN
INTRAMUSCULAR | Status: DC | PRN
  Administered 2022-09-19: 100 mg via INTRADERMAL

## 2022-09-19 MED ORDER — SODIUM CHLORIDE 0.9% FLUSH: 3.0000 mL | Freq: Two times a day (BID) | INTRAVENOUS | Status: DC

## 2022-09-19 MED ORDER — ACETAMINOPHEN 500 MG PO TABS
1000.0000 mg | ORAL_TABLET | Freq: Once | ORAL | Status: AC
  Administered 2022-09-19: 1000 mg via ORAL
  Filled 2022-09-19: qty 2

## 2022-09-19 MED ORDER — LIDOCAINE 2% (20 MG/ML) 5 ML SYRINGE
INTRAMUSCULAR | Status: DC | PRN
  Administered 2022-09-19: 100 mg via INTRAVENOUS

## 2022-09-19 MED ORDER — ACETAMINOPHEN 325 MG PO TABS: 650.0000 mg | ORAL_TABLET | ORAL | Status: DC | PRN

## 2022-09-19 MED ORDER — AMISULPRIDE (ANTIEMETIC) 5 MG/2ML IV SOLN: 10.0000 mg | Freq: Once | INTRAVENOUS | Status: DC | PRN

## 2022-09-19 MED ORDER — ONDANSETRON HCL 4 MG/2ML IJ SOLN
INTRAMUSCULAR | Status: DC | PRN
  Administered 2022-09-19: 4 mg via INTRAVENOUS

## 2022-09-19 MED ORDER — SODIUM CHLORIDE 0.9 % IV SOLN: 250.0000 mL | INTRAVENOUS | Status: DC | PRN

## 2022-09-19 MED ORDER — FENTANYL CITRATE (PF) 100 MCG/2ML IJ SOLN: 25.0000 ug | INTRAMUSCULAR | Status: DC | PRN

## 2022-09-19 MED ORDER — LACTATED RINGERS IV SOLN: INTRAVENOUS | Status: DC

## 2022-09-19 MED ORDER — OXYCODONE HCL 5 MG/5ML PO SOLN: 5.0000 mg | Freq: Once | ORAL | Status: DC | PRN

## 2022-09-19 MED ORDER — ORAL CARE MOUTH RINSE
15.0000 mL | Freq: Once | OROMUCOSAL | Status: AC
  Administered 2022-09-19: 15 mL via OROMUCOSAL

## 2022-09-19 MED ORDER — ACETAMINOPHEN 650 MG RE SUPP: 650.0000 mg | RECTAL | Status: DC | PRN

## 2022-09-19 MED ORDER — SODIUM CHLORIDE 0.9% FLUSH: 3.0000 mL | INTRAVENOUS | Status: DC | PRN

## 2022-09-19 MED ORDER — OXYCODONE HCL 5 MG PO TABS: 5.0000 mg | ORAL_TABLET | ORAL | Status: DC | PRN

## 2022-09-19 MED ORDER — EPHEDRINE SULFATE-NACL 50-0.9 MG/10ML-% IV SOSY
PREFILLED_SYRINGE | INTRAVENOUS | Status: DC | PRN
  Administered 2022-09-19: 5 mg via INTRAVENOUS

## 2022-09-19 MED ORDER — PROMETHAZINE HCL 25 MG/ML IJ SOLN: 6.2500 mg | INTRAMUSCULAR | Status: DC | PRN

## 2022-09-19 MED ORDER — FENTANYL CITRATE (PF) 250 MCG/5ML IJ SOLN
INTRAMUSCULAR | Status: AC
  Filled 2022-09-19: qty 5

## 2022-09-19 MED ORDER — OXYCODONE HCL 5 MG PO TABS: 5.0000 mg | ORAL_TABLET | Freq: Once | ORAL | Status: DC | PRN

## 2022-09-19 MED ORDER — PHENYLEPHRINE 80 MCG/ML (10ML) SYRINGE FOR IV PUSH (FOR BLOOD PRESSURE SUPPORT)
PREFILLED_SYRINGE | INTRAVENOUS | Status: DC | PRN
  Administered 2022-09-19: 80 ug via INTRAVENOUS
  Administered 2022-09-19: 160 ug via INTRAVENOUS
  Administered 2022-09-19: 80 ug via INTRAVENOUS
  Administered 2022-09-19: 160 ug via INTRAVENOUS
  Administered 2022-09-19 (×5): 80 ug via INTRAVENOUS

## 2022-09-19 MED ORDER — PROPOFOL 10 MG/ML IV BOLUS
INTRAVENOUS | Status: AC
  Filled 2022-09-19: qty 20

## 2022-09-19 MED ORDER — PROPOFOL 10 MG/ML IV BOLUS
INTRAVENOUS | Status: DC | PRN
  Administered 2022-09-19: 200 mg via INTRAVENOUS

## 2022-09-19 MED ORDER — FENTANYL CITRATE (PF) 250 MCG/5ML IJ SOLN
INTRAMUSCULAR | Status: DC | PRN
  Administered 2022-09-19 (×2): 50 ug via INTRAVENOUS

## 2022-09-19 MED ORDER — LIDOCAINE-EPINEPHRINE 1 %-1:100000 IJ SOLN
INTRAMUSCULAR | Status: AC
  Filled 2022-09-19: qty 1

## 2022-09-19 MED ORDER — MEPERIDINE HCL 25 MG/ML IJ SOLN: 6.2500 mg | INTRAMUSCULAR | Status: DC | PRN

## 2022-09-19 MED ORDER — CEFAZOLIN SODIUM-DEXTROSE 2-4 GM/100ML-% IV SOLN
2.0000 g | INTRAVENOUS | Status: AC
  Administered 2022-09-19: 2 g via INTRAVENOUS
  Filled 2022-09-19: qty 100

## 2022-09-19 SURGICAL SUPPLY — 52 items
ADH SKN CLS APL DERMABOND .7 (GAUZE/BANDAGES/DRESSINGS) ×1
BAG COUNTER SPONGE SURGICOUNT (BAG) ×2 IMPLANT
BAG SPNG CNTER NS LX DISP (BAG)
BINDER BREAST LRG (GAUZE/BANDAGES/DRESSINGS) IMPLANT
BIOPATCH RED 1 DISK 7.0 (GAUZE/BANDAGES/DRESSINGS) IMPLANT
BNDG ELASTIC 6X5.8 VLCR STR LF (GAUZE/BANDAGES/DRESSINGS) IMPLANT
BULB SUCTION JP 400 (SUCTIONS) IMPLANT
CANISTER SUCT 3000ML PPV (MISCELLANEOUS) ×2 IMPLANT
COVER SURGICAL LIGHT HANDLE (MISCELLANEOUS) ×2 IMPLANT
DERMABOND ADVANCED .7 DNX12 (GAUZE/BANDAGES/DRESSINGS) IMPLANT
DRAIN CHANNEL 19F RND (DRAIN) IMPLANT
DRAPE ORTHO SPLIT 77X108 STRL (DRAPES) ×2
DRAPE SURG ORHT 6 SPLT 77X108 (DRAPES) ×4 IMPLANT
DRSG MEPILEX POST OP 4X8 (GAUZE/BANDAGES/DRESSINGS) IMPLANT
DRSG TEGADERM 4X4.5 CHG (GAUZE/BANDAGES/DRESSINGS) IMPLANT
ELECT BLADE 4.0 EZ CLEAN MEGAD (MISCELLANEOUS) ×2
ELECT REM PT RETURN 9FT ADLT (ELECTROSURGICAL) ×1
ELECTRODE BLDE 4.0 EZ CLN MEGD (MISCELLANEOUS) IMPLANT
ELECTRODE REM PT RTRN 9FT ADLT (ELECTROSURGICAL) ×2 IMPLANT
EVACUATOR SILICONE 100CC (DRAIN) IMPLANT
GAUZE PAD ABD 8X10 STRL (GAUZE/BANDAGES/DRESSINGS) IMPLANT
GAUZE SPONGE 4X4 12PLY STRL (GAUZE/BANDAGES/DRESSINGS) IMPLANT
GLOVE BIO SURGEON STRL SZ 6.5 (GLOVE) ×6 IMPLANT
GLOVE BIOGEL M STRL SZ7.5 (GLOVE) IMPLANT
GLOVE INDICATOR 8.0 STRL GRN (GLOVE) IMPLANT
GOWN STRL REUS W/ TWL LRG LVL3 (GOWN DISPOSABLE) ×6 IMPLANT
GOWN STRL REUS W/TWL LRG LVL3 (GOWN DISPOSABLE) ×3
HEMOSTAT ARISTA ABSORB 3G PWDR (HEMOSTASIS) IMPLANT
KIT BASIN OR (CUSTOM PROCEDURE TRAY) ×2 IMPLANT
KIT TURNOVER KIT B (KITS) ×2 IMPLANT
MARKER SKIN DUAL TIP RULER LAB (MISCELLANEOUS) ×2 IMPLANT
NDL HYPO 25GX1X1/2 BEV (NEEDLE) ×2 IMPLANT
NEEDLE HYPO 25GX1X1/2 BEV (NEEDLE) ×1 IMPLANT
NS IRRIG 1000ML POUR BTL (IV SOLUTION) ×2 IMPLANT
PACK GENERAL/GYN (CUSTOM PROCEDURE TRAY) ×2 IMPLANT
PAD ABD 8X10 STRL (GAUZE/BANDAGES/DRESSINGS) IMPLANT
PAD ARMBOARD 7.5X6 YLW CONV (MISCELLANEOUS) ×4 IMPLANT
PENCIL SMOKE EVACUATOR (MISCELLANEOUS) ×2 IMPLANT
SOL PREP POV-IOD 4OZ 10% (MISCELLANEOUS) ×2 IMPLANT
STAPLER VISISTAT 35W (STAPLE) IMPLANT
SUT MNCRL AB 4-0 PS2 18 (SUTURE) ×8 IMPLANT
SUT MON AB 3-0 SH 27 (SUTURE) ×1
SUT MON AB 3-0 SH27 (SUTURE) ×2 IMPLANT
SUT MON AB 5-0 PS2 18 (SUTURE) ×2 IMPLANT
SUT PDS AB 3-0 SH 27 (SUTURE) ×2 IMPLANT
SUT PROLENE 3 0 PS 2 (SUTURE) ×4 IMPLANT
SUT SILK 3 0 PS 1 (SUTURE) IMPLANT
SUT VIC AB 3-0 SH 18 (SUTURE) ×4 IMPLANT
SYR CONTROL 10ML LL (SYRINGE) ×2 IMPLANT
TOWEL GREEN STERILE (TOWEL DISPOSABLE) ×2 IMPLANT
TOWEL GREEN STERILE FF (TOWEL DISPOSABLE) ×2 IMPLANT
WATER STERILE IRR 1000ML POUR (IV SOLUTION) IMPLANT

## 2022-09-19 NOTE — Op Note (Signed)
DATE OF OPERATION: 09/19/2022  LOCATION: [facility]  PREOPERATIVE DIAGNOSIS: Right breast seroma  POSTOPERATIVE DIAGNOSIS: Same  PROCEDURE: Removal of right breast seroma and ADM  SURGEON: Toy Samarin Sanger Delano Frate, DO  ASSISTANT: Caroline More, PA  EBL: 50 cc  CONDITION: Stable  COMPLICATIONS: None  INDICATION: The patient, Meghan Mcdonald, is a 67 y.o. female born on [birth year], is here for treatment of a chronic seroma of the right breast.  The decision was made to remove it and get cultures.   PROCEDURE DETAILS:  The patient was seen prior to surgery and marked.  The IV antibiotics were given. The patient was taken to the operating room and given a general anesthetic. A standard time out was performed and all information was confirmed by those in the room. SCDs were placed.  The patient was prepped and draped.  The incision was injected with local. The #10 blade was used to open the previous incision and excise the 1 cm area of nonviable skin.  It was not full thickness.  The bovie was used to dissect to the expander and ADM.  There was biofilm in the pocket.  The ADM and expander were removed.  The pocket was irrigated with normal saline.  The cultures were obtained.  The Arista was placed.  The bovie was used to help obtain hemostasis.  A #10 blade drain was placed.  It was secured to the skin with the 3-0 Silk.  The deep layers were closed with the 3-0 PDS followed by the 4-0 Monocryl.  Derma bond and steri strips were applied with a sterile dressing.  The patient was allowed to wake up and taken to recovery room in stable condition at the end of the case. The family was notified at the end of the case.   The advanced practice practitioner (APP) assisted throughout the case.  The APP was essential in retraction and counter traction when needed to make the case progress smoothly.  This retraction and assistance made it possible to see the tissue plans for the  procedure.  The assistance was needed for blood control, tissue re-approximation and assisted with closure of the incision site.

## 2022-09-19 NOTE — H&P (Signed)
Meghan Mcdonald is an 67 y.o. female.   Chief Complaint: right breast seroma HPI: The patient is a 67 yrs old female here for treatment of a continual seroma of the right breast.  The patient understands the situation and has decided to have the expander removed if needed and the pocket washed out.  Past Medical History:  Diagnosis Date   Anemia    years ago   Anxiety    Arthritis    Breast cancer (HCC)    Chronic shoulder pain    COVID    x 2 both mild cases   Depression    Diabetes mellitus without complication (HCC)    GERD (gastroesophageal reflux disease)    History of kidney stones    Hyperlipemia    Hypertension    Insomnia    Lichen sclerosus of vulva    Migraine    Overweight    Rapid palpitations    Ventricular tachycardia (paroxysmal) (HCC)     Past Surgical History:  Procedure Laterality Date   BLADDER SURGERY     BREAST RECONSTRUCTION WITH PLACEMENT OF TISSUE EXPANDER AND FLEX HD (ACELLULAR HYDRATED DERMIS) Right 07/31/2022   Procedure: IMMEDIATE RIGHT BREAST RECONSTRUCTION WITH PLACEMENT OF TISSUE EXPANDER AND FLEX HD (ACELLULAR HYDRATED DERMIS);  Surgeon: Peggye Form, DO;  Location: [facility];  Service: Plastics;  Laterality: Right;   CARPAL TUNNEL RELEASE     CATARACT EXTRACTION Bilateral    CHOLECYSTECTOMY     MASTECTOMY W/ SENTINEL NODE BIOPSY Right 07/31/2022   Procedure: RIGHT MASTECTOMY WITH SENTINEL LYMPH NODE BIOPSY;  Surgeon: Griselda Miner, MD;  Location: [facility];  Service: General;  Laterality: Right;   ORIF ANKLE FRACTURE Right 07/22/2018   Procedure: OPEN REDUCTION INTERNAL FIXATION (ORIF) RIGHT BIMALL ANKLE FRACTURE;  Surgeon: Terance Hart, MD;  Location: [facility];  Service: Orthopedics;  Laterality: Right;   ORIF TIBIA & FIBULA FRACTURES     TONSILLECTOMY      Family History  Problem Relation Age of Onset   Hypertension Mother    Hypertension Father    Social History:  reports  that she has never smoked. She has been exposed to tobacco smoke. She has never used smokeless tobacco. She reports current alcohol use of about 3.0 standard drinks of alcohol per week. She reports that she does not use drugs.  Allergies:  Allergies  Allergen Reactions   Hibiclens [Chlorhexidine Gluconate] Rash    Skin blisters   Prednisone Rash    Patient states turns red all over    Aspirin Other (See Comments)    Severe Blood Thinning   Micardis [Telmisartan] Other (See Comments)    unknown   Amitriptyline Other (See Comments)    oversedation   Loestrin [Norethindrone Acet-Ethinyl Est] Rash    Medications Prior to Admission  Medication Sig Dispense Refill   acetaminophen (TYLENOL) 500 MG tablet Take 1,000 mg by mouth as needed for moderate pain.     atorvastatin (LIPITOR) 80 MG tablet Take 1 tablet (80 mg total) by mouth daily at 6 PM. 30 tablet 0   Azelastine HCl 137 MCG/SPRAY SOLN Place 1 spray into both nostrils in the morning and at bedtime.     clobetasol ointment (TEMOVATE) 0.05 % Apply 1 Application topically 2 (two) times a week.     empagliflozin (JARDIANCE) 10 MG TABS tablet Take 10 mg by mouth in the morning.     famotidine (PEPCID) 20 MG tablet Take 20  mg by mouth daily as needed for heartburn or indigestion.     meclizine (ANTIVERT) 25 MG tablet Take 2 tablets (50 mg total) by mouth 3 (three) times daily. (Patient taking differently: Take 25-50 mg by mouth 3 (three) times daily as needed for nausea or dizziness.) 30 tablet 0   metFORMIN (GLUCOPHAGE-XR) 500 MG 24 hr tablet Take 1,000 mg by mouth at bedtime.     metoprolol tartrate (LOPRESSOR) 50 MG tablet Take 50 mg by mouth 2 (two) times daily.     Multiple Vitamins-Minerals (PRESERVISION/LUTEIN PO) Take 1 tablet by mouth every evening.     Omega-3 Fatty Acids (FISH OIL) 1000 MG CAPS Take 1,000 mg by mouth every evening.     ramipril (ALTACE) 10 MG capsule Take 10 mg by mouth every other day.      sulfamethoxazole-trimethoprim (BACTRIM DS) 800-160 MG tablet Take 1 tablet by mouth 2 (two) times daily for 7 days. 14 tablet 0   anastrozole (ARIMIDEX) 1 MG tablet Take 1 tablet (1 mg total) by mouth daily. 30 tablet 3   meloxicam (MOBIC) 15 MG tablet Take 15 mg by mouth daily as needed (inflammation/pain.).     methocarbamol (ROBAXIN) 500 MG tablet Take 1 tablet (500 mg total) by mouth 2 (two) times daily as needed for muscle spasms. (Patient not taking: Reported on 09/17/2022) 20 tablet 0   ondansetron (ZOFRAN-ODT) 4 MG disintegrating tablet Take 1 tablet (4 mg total) by mouth every 8 (eight) hours as needed for nausea or vomiting. (Patient not taking: Reported on 09/17/2022) 20 tablet 0   sulfamethoxazole-trimethoprim (BACTRIM DS) 800-160 MG tablet Take 1 tablet by mouth 2 (two) times daily for 7 days. (Patient not taking: Reported on 09/17/2022) 14 tablet 0    Results for orders placed or performed during the hospital encounter of 09/19/22 (from the past 48 hour(s))  Basic metabolic panel per protocol     Status: Abnormal   Collection Time: 09/19/22  1:45 PM  Result Value Ref Range   Sodium 132 (L) 135 - 145 mmol/L   Potassium 3.6 3.5 - 5.1 mmol/L    Comment: HEMOLYSIS AT THIS LEVEL MAY AFFECT RESULT   Chloride 99 98 - 111 mmol/L   CO2 22 22 - 32 mmol/L   Glucose, Bld 104 (H) 70 - 99 mg/dL    Comment: Glucose reference range applies only to samples taken after fasting for at least 8 hours.   BUN 10 8 - 23 mg/dL   Creatinine, Ser 4.09 0.44 - 1.00 mg/dL   Calcium 8.8 (L) 8.9 - 10.3 mg/dL   GFR, Estimated >81 >19 mL/min    Comment: (NOTE) Calculated using the CKD-EPI Creatinine Equation (2021)    Anion gap 11 5 - 15    Comment: Performed at [facility], [address], [place], [place] [address]  CBC per protocol     Status: None   Collection Time: 09/19/22  1:45 PM  Result Value Ref Range   WBC 5.6 4.0 - 10.5 K/uL   RBC 4.85 3.87 - 5.11 MIL/uL   Hemoglobin 14.1 12.0 - 15.0  g/dL   HCT 95.6 21.3 - 08.6 %   MCV 90.7 80.0 - 100.0 fL   MCH 29.1 26.0 - 34.0 pg   MCHC 32.0 30.0 - 36.0 g/dL   RDW 57.8 46.9 - 62.9 %   Platelets 254 150 - 400 K/uL   nRBC 0.0 0.0 - 0.2 %    Comment: Performed at [facility]  [facility], [address] [place], [place] [address]  Glucose, capillary     Status: None   Collection Time: 09/19/22  1:59 PM  Result Value Ref Range   Glucose-Capillary 91 70 - 99 mg/dL    Comment: Glucose reference range applies only to samples taken after fasting for at least 8 hours.   No results found.  Review of Systems  Constitutional: Negative.   Eyes: Negative.   Respiratory: Negative.    Cardiovascular: Negative.   Gastrointestinal: Negative.   Endocrine: Negative.   Genitourinary: Negative.     Blood pressure 131/79, pulse 78, temperature 97.8 F (36.6 C), temperature source Oral, resp. rate 18, height 5\' 2"  (1.575 m), weight 63 kg, SpO2 98 %. Physical Exam Constitutional:      Appearance: Normal appearance.  Cardiovascular:     Rate and Rhythm: Normal rate.     Pulses: Normal pulses.  Pulmonary:     Effort: Pulmonary effort is normal.  Skin:    General: Skin is warm.     Capillary Refill: Capillary refill takes less than 2 seconds.     Coloration: Skin is not jaundiced.     Findings: Erythema present.  Neurological:     Mental Status: She is alert and oriented to person, place, and time.  Psychiatric:        Mood and Affect: Mood normal.        Behavior: Behavior normal.        Thought Content: Thought content normal.        Judgment: Judgment normal.      Assessment/Plan Seroma of right breast.  Plan for washout and possible removal of the right breast expander.  Risks and complication were reviewed in the office as well as today.  Alena Bills Caddie Randle, DO 09/19/2022, 3:03 PM

## 2022-09-19 NOTE — Discharge Instructions (Signed)

## 2022-09-19 NOTE — Interval H&P Note (Signed)
History and Physical Interval Note:  09/19/2022 3:03 PM  Meghan Mcdonald  has presented today for surgery, with the diagnosis of S/P breast reconstruction, wound.  The various methods of treatment have been discussed with the patient and family. After consideration of risks, benefits and other options for treatment, the patient has consented to  Procedure(s): REMOVAL RIGHT TISSUE EXPANDER (Right) as a surgical intervention.  The patient's history has been reviewed, patient examined, no change in status, stable for surgery.  I have reviewed the patient's chart and labs.  Questions were answered to the patient's satisfaction.     Alena Bills Chenae Brager

## 2022-09-19 NOTE — Transfer of Care (Signed)
Immediate Anesthesia Transfer of Care Note  Patient: Meghan Mcdonald  Procedure(s) Performed: REMOVAL RIGHT TISSUE EXPANDER (Right: Breast)  Patient Location: PACU  Anesthesia Type:General  Level of Consciousness: responds to stimulation  Airway & Oxygen Therapy: Patient Spontanous Breathing and Patient connected to face mask oxygen  Post-op Assessment: Report given to RN and Post -op Vital signs reviewed and stable  Post vital signs: Reviewed and stable  Last Vitals:  Vitals Value Taken Time  BP 100/56 09/19/22 1650  Temp    Pulse 87 09/19/22 1651  Resp 11 09/19/22 1651  SpO2 98 % 09/19/22 1651  Vitals shown include unvalidated device data.  Last Pain:  Vitals:   09/19/22 1358  TempSrc: Oral  PainSc: 0-No pain         Complications: No notable events documented.

## 2022-09-19 NOTE — Anesthesia Preprocedure Evaluation (Signed)
Anesthesia Evaluation  Patient identified by MRN, date of birth, ID band Patient awake    Reviewed: Allergy & Precautions, H&P , NPO status , Patient's Chart, lab work & pertinent test results, reviewed documented beta blocker date and time   Airway Mallampati: II   Neck ROM: full    Dental  (+) Dental Advisory Given   Pulmonary neg pulmonary ROS   breath sounds clear to auscultation       Cardiovascular hypertension, Pt. on home beta blockers  Rhythm:regular Rate:Normal     Neuro/Psych  Headaches PSYCHIATRIC DISORDERS Anxiety Depression    CVA    GI/Hepatic ,GERD  ,,  Endo/Other  diabetes, Type 2    Renal/GU      Musculoskeletal  (+) Arthritis ,    Abdominal   Peds  Hematology  (+) Blood dyscrasia, anemia   Anesthesia Other Findings   Reproductive/Obstetrics Breast CA                             Anesthesia Physical Anesthesia Plan  ASA: 2  Anesthesia Plan: General   Post-op Pain Management: Tylenol PO (pre-op)*   Induction: Intravenous  PONV Risk Score and Plan: 4 or greater and Ondansetron, Dexamethasone, Midazolam and Treatment may vary due to age or medical condition  Airway Management Planned: LMA  Additional Equipment:   Intra-op Plan:   Post-operative Plan: Extubation in OR  Informed Consent: I have reviewed the patients History and Physical, chart, labs and discussed the procedure including the risks, benefits and alternatives for the proposed anesthesia with the patient or authorized representative who has indicated his/her understanding and acceptance.     Dental advisory given  Plan Discussed with: CRNA  Anesthesia Plan Comments:        Anesthesia Quick Evaluation

## 2022-09-19 NOTE — Anesthesia Procedure Notes (Signed)
Procedure Name: LMA Insertion Date/Time: 09/19/2022 3:42 PM  Performed by: Loleta Fareed Fung, CRNAPre-anesthesia Checklist: Patient identified, Patient being monitored, Timeout performed, Emergency Drugs available and Suction available Patient Re-evaluated:Patient Re-evaluated prior to induction Oxygen Delivery Method: Circle system utilized Preoxygenation: Pre-oxygenation with 100% oxygen Induction Type: IV induction Ventilation: Mask ventilation without difficulty LMA: LMA inserted LMA Size: 4.0 Tube type: Oral Number of attempts: 1 Placement Confirmation: positive ETCO2 and breath sounds checked- equal and bilateral Tube secured with: Tape Dental Injury: Teeth and Oropharynx as per pre-operative assessment

## 2022-09-20 ENCOUNTER — Encounter (HOSPITAL_COMMUNITY): Payer: Self-pay | Admitting: Plastic Surgery

## 2022-09-20 NOTE — Anesthesia Postprocedure Evaluation (Signed)
Anesthesia Post Note  Patient: Meghan Mcdonald  Procedure(s) Performed: REMOVAL RIGHT TISSUE EXPANDER (Right: Breast)     Patient location during evaluation: PACU Anesthesia Type: General Level of consciousness: awake Pain management: pain level controlled Vital Signs Assessment: post-procedure vital signs reviewed and stable Respiratory status: spontaneous breathing, nonlabored ventilation and respiratory function stable Cardiovascular status: blood pressure returned to baseline and stable Postop Assessment: no apparent nausea or vomiting Anesthetic complications: no   No notable events documented.  Last Vitals:  Vitals:   09/19/22 1715 09/19/22 1730  BP: 108/65 103/63  Pulse: 82 81  Resp: 12 14  Temp:  36.4 C  SpO2: 92% 96%    Last Pain:  Vitals:   09/19/22 1715  TempSrc:   PainSc: 0-No pain                 Linton Rump

## 2022-09-21 LAB — AEROBIC/ANAEROBIC CULTURE W GRAM STAIN (SURGICAL/DEEP WOUND)

## 2022-09-22 ENCOUNTER — Encounter: Payer: 59 | Admitting: Rehabilitation

## 2022-09-22 LAB — AEROBIC/ANAEROBIC CULTURE W GRAM STAIN (SURGICAL/DEEP WOUND): Culture: NO GROWTH

## 2022-09-23 ENCOUNTER — Ambulatory Visit (INDEPENDENT_AMBULATORY_CARE_PROVIDER_SITE_OTHER): Payer: 59 | Admitting: Plastic Surgery

## 2022-09-23 ENCOUNTER — Encounter: Payer: Self-pay | Admitting: Plastic Surgery

## 2022-09-23 VITALS — BP 117/77 | HR 68 | Ht 62.0 in | Wt 139.0 lb

## 2022-09-23 DIAGNOSIS — Z170 Estrogen receptor positive status [ER+]: Secondary | ICD-10-CM

## 2022-09-23 DIAGNOSIS — C50411 Malignant neoplasm of upper-outer quadrant of right female breast: Secondary | ICD-10-CM

## 2022-09-23 LAB — AEROBIC/ANAEROBIC CULTURE W GRAM STAIN (SURGICAL/DEEP WOUND)

## 2022-09-23 NOTE — Progress Notes (Signed)
The patient is a 67 year old female here for follow-up after having removed her expander last Friday of her right breast.  She looks better and feels better.  Her pathology the micro has come back negative.  Her drain is in and working.  The skin looks much better.  Will continue for another couple of days.  If the drain output is pretty much nonexistent we will plan to remove it Friday otherwise on Monday.  We also talked about reconstruction that we will plan for in September.

## 2022-09-24 ENCOUNTER — Encounter: Payer: 59 | Admitting: Rehabilitation

## 2022-09-24 LAB — AEROBIC/ANAEROBIC CULTURE W GRAM STAIN (SURGICAL/DEEP WOUND): Gram Stain: NONE SEEN

## 2022-09-25 ENCOUNTER — Telehealth: Payer: Self-pay | Admitting: *Deleted

## 2022-09-25 DIAGNOSIS — D0511 Intraductal carcinoma in situ of right breast: Secondary | ICD-10-CM

## 2022-09-25 NOTE — Progress Notes (Signed)
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 Ulice Bold who returns to clinic for postoperative follow-up.  She was last seen here in clinic on 09/23/2022.  At that time, her symptoms of discomfort had significantly improved and her cultures were negative for infection.  Drain was in place and functioning well.  Plan was for likely drain removal 09/29/2022, but if she was having absolutely no drainage 09/26/2022 could consider drain removal.  Plan for second attempt at reconstruction 12/2022.  Today, patient is doing well.  She has the occasional muscle spasm, but it is fleeting and well-controlled with Tylenol and Naprosyn.  She denies any leg swelling, chest pain, difficulty breathing, fevers, or other symptoms.  Drain output has been between 15 to 20 cc for the past couple of days.  On exam, drain intact and functional.  Bordered Mepilex dressing in place, will not remove.  Surrounding skin does not feel indurated and there is no erythema or obvious subcutaneous fluid collections appreciated.  Discussed case with Dr. Ulice Bold and unless there was less than 10 cc output from the drain, discussed leaving it in over the weekend to err on the side of caution given her previous seroma.  Patient was entirely understanding and agreeable.  Will leave the bordered Mepilex dressing in place until Monday.  At which time, can likely remove and obtain postoperative photos.

## 2022-09-25 NOTE — Telephone Encounter (Signed)
Received bone density report. After review per Dr. Mosetta Putt, pt notified to start anastrozole as well as recommendations for calcium, vit d and wt bearing exercises.

## 2022-09-26 ENCOUNTER — Encounter: Payer: Self-pay | Admitting: Physician Assistant

## 2022-09-26 ENCOUNTER — Ambulatory Visit (INDEPENDENT_AMBULATORY_CARE_PROVIDER_SITE_OTHER): Payer: 59 | Admitting: Physician Assistant

## 2022-09-26 VITALS — BP 119/74 | HR 73 | Ht 62.0 in | Wt 140.0 lb

## 2022-09-26 DIAGNOSIS — C50411 Malignant neoplasm of upper-outer quadrant of right female breast: Secondary | ICD-10-CM

## 2022-09-26 DIAGNOSIS — Z9889 Other specified postprocedural states: Secondary | ICD-10-CM

## 2022-09-29 ENCOUNTER — Ambulatory Visit (INDEPENDENT_AMBULATORY_CARE_PROVIDER_SITE_OTHER): Payer: 59 | Admitting: Physician Assistant

## 2022-09-29 ENCOUNTER — Encounter: Payer: Self-pay | Admitting: Physician Assistant

## 2022-09-29 VITALS — BP 130/83 | HR 71 | Ht 62.0 in | Wt 140.0 lb

## 2022-09-29 DIAGNOSIS — Z170 Estrogen receptor positive status [ER+]: Secondary | ICD-10-CM

## 2022-09-29 DIAGNOSIS — Z9889 Other specified postprocedural states: Secondary | ICD-10-CM

## 2022-09-29 NOTE — Progress Notes (Signed)
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 Ulice Bold who returns to clinic for postoperative follow-up.  She was last seen here in clinic on 09/26/2022.  At that time, drain output varied between 15 to 20 cc removed days.  Given her history of seroma requiring expander removal, decision was made to leave the drain in place for the weekend and then return next week for possible removal.  Exam was entirely reassuring and she reported that she was doing well.  Today, patient is doing well.  Reports that her volume output has been approximately 10 cc/day over the weekend.  She is hopeful for drain movable today.  She continues to deny any chest pain, difficulty breathing, leg swelling, fevers, or other complications.  Pain is well-controlled and bordered Mepilex dressing is in place.  On exam, drain is intact and functional, normal-appearing output in bulb.  Drain is removed without complication or difficulty.  Bordered Mepilex dressing and underlying Steri-Strips are also removed given some drainage centrally.  However, no overt incisional wound.  There were multiple sutures centrally where she was having scant yellow drainage, likely some residual slough from her previous incisional wound that has now been repaired.  No malodor or erythema.  No induration or tenderness.  Recommended continued activity modifications and compressive garments.  She is going to begin PT later this week to help with her right upper arm stiffness.  Gentle stretching as tolerated.  Still no heavy lifting.  Bordered Mepilex dressing was replaced.  Gauze was placed over the drain tube insertion site with recommendations to change as needed.  Return in 2 weeks, sooner if  needed.  Will obtain postoperative photos at that time.

## 2022-09-30 ENCOUNTER — Ambulatory Visit: Payer: 59 | Admitting: Rehabilitation

## 2022-09-30 DIAGNOSIS — C50411 Malignant neoplasm of upper-outer quadrant of right female breast: Secondary | ICD-10-CM | POA: Diagnosis not present

## 2022-09-30 DIAGNOSIS — I10 Essential (primary) hypertension: Secondary | ICD-10-CM | POA: Diagnosis not present

## 2022-09-30 DIAGNOSIS — Z0000 Encounter for general adult medical examination without abnormal findings: Secondary | ICD-10-CM | POA: Diagnosis not present

## 2022-09-30 DIAGNOSIS — F419 Anxiety disorder, unspecified: Secondary | ICD-10-CM | POA: Diagnosis not present

## 2022-09-30 DIAGNOSIS — E1169 Type 2 diabetes mellitus with other specified complication: Secondary | ICD-10-CM | POA: Diagnosis not present

## 2022-09-30 DIAGNOSIS — Z1211 Encounter for screening for malignant neoplasm of colon: Secondary | ICD-10-CM | POA: Diagnosis not present

## 2022-09-30 DIAGNOSIS — E782 Mixed hyperlipidemia: Secondary | ICD-10-CM | POA: Diagnosis not present

## 2022-09-30 DIAGNOSIS — Z124 Encounter for screening for malignant neoplasm of cervix: Secondary | ICD-10-CM | POA: Diagnosis not present

## 2022-10-02 ENCOUNTER — Encounter: Payer: Self-pay | Admitting: Rehabilitation

## 2022-10-02 ENCOUNTER — Ambulatory Visit: Payer: 59 | Attending: Hematology | Admitting: Rehabilitation

## 2022-10-02 DIAGNOSIS — M25611 Stiffness of right shoulder, not elsewhere classified: Secondary | ICD-10-CM | POA: Diagnosis not present

## 2022-10-02 DIAGNOSIS — R42 Dizziness and giddiness: Secondary | ICD-10-CM | POA: Diagnosis not present

## 2022-10-02 DIAGNOSIS — D0511 Intraductal carcinoma in situ of right breast: Secondary | ICD-10-CM | POA: Diagnosis not present

## 2022-10-02 DIAGNOSIS — R293 Abnormal posture: Secondary | ICD-10-CM | POA: Diagnosis not present

## 2022-10-02 DIAGNOSIS — Z483 Aftercare following surgery for neoplasm: Secondary | ICD-10-CM | POA: Diagnosis not present

## 2022-10-02 NOTE — Therapy (Signed)
OUTPATIENT PHYSICAL THERAPY BREAST CANCER TREATMENT   Patient Name: Meghan Mcdonald MRN: [id number] DOB:1956-03-09, 67 y.o., female Today's Date: 10/02/2022  END OF SESSION:  PT End of Session - 10/02/22 1402     Visit Number 5    Number of Visits 11    Date for PT Re-Evaluation 10/07/22    PT Start Time 1405    PT Stop Time 1440    PT Time Calculation (min) 35 min    Activity Tolerance Patient tolerated treatment well    Behavior During Therapy WFL for tasks assessed/performed             Past Medical History:  Diagnosis Date   Anemia    years ago   Anxiety    Arthritis    Breast cancer (HCC)    Chronic shoulder pain    COVID    x 2 both mild cases   Depression    Diabetes mellitus without complication (HCC)    GERD (gastroesophageal reflux disease)    History of kidney stones    Hyperlipemia    Hypertension    Insomnia    Lichen sclerosus of vulva    Migraine    Overweight    Rapid palpitations    Ventricular tachycardia (paroxysmal) (HCC)    Past Surgical History:  Procedure Laterality Date   BLADDER SURGERY     BREAST IMPLANT REMOVAL Right 09/19/2022   Procedure: REMOVAL RIGHT TISSUE EXPANDER;  Surgeon: Peggye Form, DO;  Location: [facility];  Service: Plastics;  Laterality: Right;   BREAST RECONSTRUCTION WITH PLACEMENT OF TISSUE EXPANDER AND FLEX HD (ACELLULAR HYDRATED DERMIS) Right 07/31/2022   Procedure: IMMEDIATE RIGHT BREAST RECONSTRUCTION WITH PLACEMENT OF TISSUE EXPANDER AND FLEX HD (ACELLULAR HYDRATED DERMIS);  Surgeon: Peggye Form, DO;  Location: [facility];  Service: Plastics;  Laterality: Right;   CARPAL TUNNEL RELEASE     CATARACT EXTRACTION Bilateral    CHOLECYSTECTOMY     MASTECTOMY W/ SENTINEL NODE BIOPSY Right 07/31/2022   Procedure: RIGHT MASTECTOMY WITH SENTINEL LYMPH NODE BIOPSY;  Surgeon: Griselda Miner, MD;  Location: [facility];  Service: General;  Laterality: Right;   ORIF ANKLE FRACTURE Right  07/22/2018   Procedure: OPEN REDUCTION INTERNAL FIXATION (ORIF) RIGHT BIMALL ANKLE FRACTURE;  Surgeon: Terance Hart, MD;  Location: [facility];  Service: Orthopedics;  Laterality: Right;   ORIF TIBIA & FIBULA FRACTURES     TONSILLECTOMY     Patient Active Problem List   Diagnosis Date Noted   Malignant neoplasm of right breast in female, estrogen receptor positive (HCC) 08/11/2022   Breast cancer (HCC) 07/31/2022   S/P breast reconstruction 07/31/2022   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, estrogen receptor positive (HCC) 05/26/2022   Acute CVA (cerebrovascular accident) (HCC) 12/23/2017   Severe vertigo 12/22/2017   Vertigo 12/22/2017   Dyslipidemia 12/22/2017   Essential hypertension 12/22/2017   Nausea & vomiting 12/22/2017    PCP: Tera Helper, PA-C  REFERRING PROVIDER: Malachy Mood, MD   REFERRING DIAG: D05.11 (ICD-10-CM) - Ductal carcinoma in situ (DCIS) of right breast   THERAPY DIAG:  Stiffness of right shoulder, not elsewhere classified  Aftercare following surgery for neoplasm  Dizziness and giddiness  Rationale for Evaluation and Treatment: Rehabilitation  ONSET DATE: 05/14/22  SUBJECTIVE:  SUBJECTIVE STATEMENT: They ended up pulling my expanders out and now I have nothing in the chest.  After my August trip we will try and expander again.  They saw me Monday and pulled the drain.  My dizziness is much better.    PERTINENT HISTORY:  Patient was diagnosed on 05/14/22 with R breast cancer. She had a total of 3 biopsies: 1 on 05/14/22: ER+ PR+ DCIS, 05/21/22: DCIS 2/5 mm ER/PR+, 06/11/22: DCIS grade 2, ER+, PR- Ki67: 20%, Hx of diabetes, hypertension. 07/31/22- R mastectomy SLNB 0/2. Had placement of expanders  PATIENT GOALS:  Reassess how  my recovery is going related to arm function, pain, and swelling.  PAIN:  Are you having pain? No just soreness   PRECAUTIONS: Recent Surgery, right UE Lymphedema risk, pins and plates in R ankle  ACTIVITY LEVEL / LEISURE: pt has been planting the garden and pulling weeds   OBJECTIVE:   OBSERVATIONS: Quarter size area in middle of incision is still healing with yellow exudate - covered with a bandage, area of redness towards medial breast that pt reports is itchy - she does report that this area has gotten smaller since yesterday  POSTURE:  Forward head and rounded shoulders posture   UPPER EXTREMITY AROM/PROM:   A/PROM RIGHT   eval   RIGHT 09/09/22 10/02/22  Shoulder extension 66 72   Shoulder flexion 165 155 148  Shoulder abduction 179 127 125  Shoulder internal rotation 62 65   Shoulder external rotation 85 80 85                          (Blank rows = not tested)   A/PROM LEFT   eval  Shoulder extension 66  Shoulder flexion 162  Shoulder abduction 180  Shoulder internal rotation 55  Shoulder external rotation 83                          (Blank rows = not tested)   CERVICAL AROM: All within normal limits:      Percent limited  Flexion WFL  Extension WFL  Right lateral flexion WFL  Left lateral flexion WFL  Right rotation WFL  Left rotation WFL      UPPER EXTREMITY STRENGTH: 4+/5   LYMPHEDEMA ASSESSMENTS:    LANDMARK RIGHT   eval RIGHT  09/09/22  10 cm proximal to olecranon process 26.7 25.5  Olecranon process 24.5 24  10  cm proximal to ulnar styloid process 21.7 20.9  Just proximal to ulnar styloid process 15.2 15  Across hand at thumb web space 19.7 18.4  At base of 2nd digit 6.4 6.2  (Blank rows = not tested)   LANDMARK LEFT   eval  10 cm proximal to olecranon process 25.3  Olecranon process 24.3  10 cm proximal to ulnar styloid process 21.1  Just proximal to ulnar styloid process 15.4  Across hand at thumb web space 18.5  At base of 2nd  digit 6.2  (Blank rows = not tested)    Surgery type/Date: 07/31/22 R mastectomy and SLNB Number of lymph nodes removed: 0/2 Current/past treatment (chemo, radiation, hormone therapy): pt will begin anastrozole in June - will not need chemo or radiation Other symptoms:  Heaviness/tightness Yes Pain No Pitting edema No Infections No Decreased scar mobility: still healing Stemmer sign No  TODAY"S TREATMENT 10/02/22 Now after surgery with instruction for gentle motion only until incision healing PROM stopping prior to  resistance for flexion, abduction, ER STM Rt pectoralis with cocoa butter Pt experienced one episode of vertigo around 5-10 minutes after laying supine that appeared Lt torsional and lasted around [employer] other vestibular therapist regarding more spontaneous presentation.   09/17/22 VESTIBULAR ASSESSMENT:  SYMPTOM BEHAVIOR:  Subjective history: see above  Non-Vestibular symptoms: BP low, Lt ear feels a bit full  Type of dizziness: Spinning/Vertigo  Frequency: head turns especially rolling and head turns and supine to sit  Duration: 30-40sec  Progression of symptoms: worse after surgery  OCULOMOTOR EXAM:  Ocular Alignment: normal  Ocular ROM: No Limitations  Spontaneous Nystagmus: absent  Gaze-Induced Nystagmus: absent  Smooth Pursuits: intact  Saccades: intact   VESTIBULAR - OCULAR REFLEX:   Head-Impulse Test: HIT Right: negative, Left negative     POSITIONAL TESTING: DHM left negative, DHM right positive with nystagmus noted x 12 seconds - moved right into CRM and then education on doing her epley correctly and for the Rt side.    PATIENT EDUCATION:  Education details: post op exercises Person educated: Patient Education method: Chief Technology [employer] Education comprehension: verbalized understanding  HOME EXERCISE PROGRAM: Reviewed previously given post op HEP.   ASSESSMENT:  CLINICAL IMPRESSION:  Pt had expander removal due to poor  healing.  She did not lose any ROM overall but is instructed to do gentle ROM unitl she can take her bandage off on Monday.  Will revisit vertigo as pt is able to move around more after healing.  May also have vestibular co-treat if needed.   Pt will benefit from skilled therapeutic intervention to improve on the following deficits: Decreased knowledge of precautions, impaired UE functional use, pain, decreased ROM, postural dysfunction, increased dizziness  PT treatment/interventions: ADL/Self care home management, Therapeutic exercises, Therapeutic activity, Patient/Family education, Self Care, Joint mobilization, Canalith repositioning, Orthotic/Fit training, Manual lymph drainage, Compression bandaging, scar mobilization, Taping, Vasopneumatic device, Manual therapy, and Re-evaluation   GOALS: Goals reviewed with patient? Yes  LONG TERM GOALS:  (STG=LTG)  GOALS Name Target Date  Goal status  1 Pt will demonstrate she has regained full shoulder ROM and function post operatively compared to baselines.  Baseline: 10/07/22 NEW  2 Pt will demonstrate 170 degrees of R shoulder abduction to allow her to reach out to the side.  10/07/22 NEW  3 Pt will demonstrate 165 degrees of R shoulder flexion to allow her to reach overhead.  10/07/22 NEW  4 Pt will report she has had a 75% improvement in her vertigo symptoms to allow her to look upwards without getting dizzy.  10/07/22 NEW  5 Pt will be independent in a home exercise program for continued stretching and strengthening.  10/07/22 NEW     PLAN:  PT FREQUENCY/DURATION: 2x/wk for 4 wks  PLAN FOR NEXT SESSION: redo positional testing, Rt UE ROM   [facility]  [address], [address]  [place] [address]  [phone]  After Breast Cancer Class It is recommended you attend the ABC class to be educated on lymphedema risk reduction. This class is free of charge and lasts for 1 hour. It is a 1-time class. You will need to  download the TEAMS app either on your phone or computer. We will send you a link the night before or the morning of the class. You should be able to click on that link to join the class. This is not a confidential class. You don't have to turn your camera on, but other participants may  be able to see your email address.  Scar massage You can begin gentle scar massage to you incision sites. Gently place one hand on the incision and move the skin (without sliding on the skin) in various directions. Do this for a few minutes and then you can gently massage either coconut oil or vitamin E cream into the scars.  Compression garment You should continue wearing your compression bra until you feel like you no longer have swelling.  Home exercise Program Continue doing the exercises you were given until you feel like you can do them without feeling any tightness at the end.   Walking Program Studies show that 30 minutes of walking per day (fast enough to elevate your heart rate) can significantly reduce the risk of a cancer recurrence. If you can't walk due to other medical reasons, we encourage you to find another activity you could do (like a stationary bike or water exercise).  Posture After breast cancer surgery, people frequently sit with rounded shoulders posture because it puts their incisions on slack and feels better. If you sit like this and scar tissue forms in that position, you can become very tight and have pain sitting or standing with good posture. Try to be aware of your posture and sit and stand up tall to heal properly.  Follow up PT: It is recommended you return every 3 months for the first 3 years following surgery to be assessed on the SOZO machine for an L-Dex score. This helps prevent clinically significant lymphedema in 95% of patients. These follow up screens are 10 minute appointments that you are not billed for.  Idamae Lusher, PT 10/02/2022, 2:51 PM

## 2022-10-06 ENCOUNTER — Encounter: Payer: Self-pay | Admitting: Rehabilitation

## 2022-10-06 ENCOUNTER — Ambulatory Visit: Payer: 59 | Admitting: Rehabilitation

## 2022-10-06 DIAGNOSIS — R42 Dizziness and giddiness: Secondary | ICD-10-CM | POA: Diagnosis not present

## 2022-10-06 DIAGNOSIS — D0511 Intraductal carcinoma in situ of right breast: Secondary | ICD-10-CM

## 2022-10-06 DIAGNOSIS — Z483 Aftercare following surgery for neoplasm: Secondary | ICD-10-CM | POA: Diagnosis not present

## 2022-10-06 DIAGNOSIS — M25611 Stiffness of right shoulder, not elsewhere classified: Secondary | ICD-10-CM | POA: Diagnosis not present

## 2022-10-06 DIAGNOSIS — R293 Abnormal posture: Secondary | ICD-10-CM | POA: Diagnosis not present

## 2022-10-06 NOTE — Therapy (Signed)
OUTPATIENT PHYSICAL THERAPY BREAST CANCER TREATMENT   Patient Name: Meghan Mcdonald MRN: [id number] DOB:27-Sep-1955, 67 y.o., female Today's Date: 10/06/2022  END OF SESSION:  PT End of Session - 10/06/22 1359     Visit Number 6    Number of Visits 11    Date for PT Re-Evaluation 10/07/22    PT Start Time 1400    PT Stop Time 1441    PT Time Calculation (min) 41 min    Activity Tolerance Patient tolerated treatment well    Behavior During Therapy WFL for tasks assessed/performed             Past Medical History:  Diagnosis Date   Anemia    years ago   Anxiety    Arthritis    Breast cancer (HCC)    Chronic shoulder pain    COVID    x 2 both mild cases   Depression    Diabetes mellitus without complication (HCC)    GERD (gastroesophageal reflux disease)    History of kidney stones    Hyperlipemia    Hypertension    Insomnia    Lichen sclerosus of vulva    Migraine    Overweight    Rapid palpitations    Ventricular tachycardia (paroxysmal) (HCC)    Past Surgical History:  Procedure Laterality Date   BLADDER SURGERY     BREAST IMPLANT REMOVAL Right 09/19/2022   Procedure: REMOVAL RIGHT TISSUE EXPANDER;  Surgeon: Peggye Form, DO;  Location: [facility];  Service: Plastics;  Laterality: Right;   BREAST RECONSTRUCTION WITH PLACEMENT OF TISSUE EXPANDER AND FLEX HD (ACELLULAR HYDRATED DERMIS) Right 07/31/2022   Procedure: IMMEDIATE RIGHT BREAST RECONSTRUCTION WITH PLACEMENT OF TISSUE EXPANDER AND FLEX HD (ACELLULAR HYDRATED DERMIS);  Surgeon: Peggye Form, DO;  Location: [facility];  Service: Plastics;  Laterality: Right;   CARPAL TUNNEL RELEASE     CATARACT EXTRACTION Bilateral    CHOLECYSTECTOMY     MASTECTOMY W/ SENTINEL NODE BIOPSY Right 07/31/2022   Procedure: RIGHT MASTECTOMY WITH SENTINEL LYMPH NODE BIOPSY;  Surgeon: Griselda Miner, MD;  Location: [facility];  Service: General;  Laterality: Right;   ORIF ANKLE FRACTURE Right  07/22/2018   Procedure: OPEN REDUCTION INTERNAL FIXATION (ORIF) RIGHT BIMALL ANKLE FRACTURE;  Surgeon: Terance Hart, MD;  Location: [facility];  Service: Orthopedics;  Laterality: Right;   ORIF TIBIA & FIBULA FRACTURES     TONSILLECTOMY     Patient Active Problem List   Diagnosis Date Noted   Malignant neoplasm of right breast in female, estrogen receptor positive (HCC) 08/11/2022   Breast cancer (HCC) 07/31/2022   S/P breast reconstruction 07/31/2022   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, estrogen receptor positive (HCC) 05/26/2022   Acute CVA (cerebrovascular accident) (HCC) 12/23/2017   Severe vertigo 12/22/2017   Vertigo 12/22/2017   Dyslipidemia 12/22/2017   Essential hypertension 12/22/2017   Nausea & vomiting 12/22/2017    PCP: Tera Helper, PA-C  REFERRING PROVIDER: Malachy Mood, MD   REFERRING DIAG: D05.11 (ICD-10-CM) - Ductal carcinoma in situ (DCIS) of right breast   THERAPY DIAG:  Stiffness of right shoulder, not elsewhere classified  Aftercare following surgery for neoplasm  Dizziness and giddiness  Abnormal posture  Ductal carcinoma in situ (DCIS) of right breast  Rationale for Evaluation and Treatment: Rehabilitation  ONSET DATE: 05/14/22  SUBJECTIVE:  SUBJECTIVE STATEMENT: I haven't really felt dizzy.  I also feel like I can move more  PERTINENT HISTORY:  Patient was diagnosed on 05/14/22 with R breast cancer. She had a total of 3 biopsies: 1 on 05/14/22: ER+ PR+ DCIS, 05/21/22: DCIS 2/5 mm ER/PR+, 06/11/22: DCIS grade 2, ER+, PR- Ki67: 20%, Hx of diabetes, hypertension. 07/31/22- R mastectomy SLNB 0/2. Had placement of expanders  PATIENT GOALS:  Reassess how my recovery is going related to arm function, pain, and  swelling.  PAIN:  Are you having pain? No just soreness   PRECAUTIONS: Recent Surgery, right UE Lymphedema risk, pins and plates in R ankle  ACTIVITY LEVEL / LEISURE: pt has been planting the garden and pulling weeds   OBJECTIVE:   OBSERVATIONS: Quarter size area in middle of incision is still healing with yellow exudate - covered with a bandage, area of redness towards medial breast that pt reports is itchy - she does report that this area has gotten smaller since yesterday  POSTURE:  Forward head and rounded shoulders posture   UPPER EXTREMITY AROM/PROM:   A/PROM RIGHT   eval   RIGHT 09/09/22 10/02/22 10/06/22  Shoulder extension 66 72    Shoulder flexion 165 155 148 157  Shoulder abduction 179 127 125 160  Shoulder internal rotation 62 65    Shoulder external rotation 85 80 85                           (Blank rows = not tested)   A/PROM LEFT   eval  Shoulder extension 66  Shoulder flexion 162  Shoulder abduction 180  Shoulder internal rotation 55  Shoulder external rotation 83                          (Blank rows = not tested)   CERVICAL AROM: All within normal limits:      Percent limited  Flexion WFL  Extension WFL  Right lateral flexion WFL  Left lateral flexion WFL  Right rotation WFL  Left rotation WFL      UPPER EXTREMITY STRENGTH: 4+/5   LYMPHEDEMA ASSESSMENTS:    LANDMARK RIGHT   eval RIGHT  09/09/22  10 cm proximal to olecranon process 26.7 25.5  Olecranon process 24.5 24  10  cm proximal to ulnar styloid process 21.7 20.9  Just proximal to ulnar styloid process 15.2 15  Across hand at thumb web space 19.7 18.4  At base of 2nd digit 6.4 6.2  (Blank rows = not tested)   LANDMARK LEFT   eval  10 cm proximal to olecranon process 25.3  Olecranon process 24.3  10 cm proximal to ulnar styloid process 21.1  Just proximal to ulnar styloid process 15.4  Across hand at thumb web space 18.5  At base of 2nd digit 6.2  (Blank rows = not tested)     Surgery type/Date: 07/31/22 R mastectomy and SLNB Number of lymph nodes removed: 0/2 Current/past treatment (chemo, radiation, hormone therapy): pt will begin anastrozole in June - will not need chemo or radiation Other symptoms:  Heaviness/tightness Yes Pain No Pitting edema No Infections No Decreased scar mobility: still healing Stemmer sign No  TODAY"S TREATMENT 10/06/22 Checked incision healing. Healing well - stitches present with scabbing middle of horizontal incision.  PROM stopping prior to resistance for flexion, abduction, ER STM Rt pectoralis with cocoa butter Pt experienced one episode of vertigo around 10-15 minutes  after laying supine that appeared Lt torsional and lasted around 6sec Pulleys into flexion and abduction x each Doorway stretch 3x15" with initial instruction Pt okayed to start doing post op exercises again.   10/02/22 Now after surgery with instruction for gentle motion only until incision healing PROM stopping prior to resistance for flexion, abduction, ER STM Rt pectoralis with cocoa butter Pt experienced one episode of vertigo around 5-10 minutes after laying supine that appeared Lt torsional and lasted around [employer] other vestibular therapist regarding more spontaneous presentation.   09/17/22 VESTIBULAR ASSESSMENT:  SYMPTOM BEHAVIOR:  Subjective history: see above  Non-Vestibular symptoms: BP low, Lt ear feels a bit full  Type of dizziness: Spinning/Vertigo  Frequency: head turns especially rolling and head turns and supine to sit  Duration: 30-40sec  Progression of symptoms: worse after surgery  OCULOMOTOR EXAM:  Ocular Alignment: normal  Ocular ROM: No Limitations  Spontaneous Nystagmus: absent  Gaze-Induced Nystagmus: absent  Smooth Pursuits: intact  Saccades: intact   VESTIBULAR - OCULAR REFLEX:   Head-Impulse Test: HIT Right: negative, Left negative     POSITIONAL TESTING: DHM left negative, DHM right positive with  nystagmus noted x 12 seconds - moved right into CRM and then education on doing her epley correctly and for the Rt side.    PATIENT EDUCATION:  Education details: post op exercises Person educated: Patient Education method: Chief Technology Officer Education comprehension: verbalized understanding  HOME EXERCISE PROGRAM: Reviewed previously given post op HEP.   ASSESSMENT:  CLINICAL IMPRESSION:  Pt is approaching baseline AROM should be full in 1-2 more visits.  Will add band exercises as able before DC.    Will retest vestibular as needed or refer to vestibular rehab.   Pt will benefit from skilled therapeutic intervention to improve on the following deficits: Decreased knowledge of precautions, impaired UE functional use, pain, decreased ROM, postural dysfunction, increased dizziness  PT treatment/interventions: ADL/Self care home management, Therapeutic exercises, Therapeutic activity, Patient/Family education, Self Care, Joint mobilization, Canalith repositioning, Orthotic/Fit training, Manual lymph drainage, Compression bandaging, scar mobilization, Taping, Vasopneumatic device, Manual therapy, and Re-evaluation   GOALS: Goals reviewed with patient? Yes  LONG TERM GOALS:  (STG=LTG)  GOALS Name Target Date  Goal status  1 Pt will demonstrate she has regained full shoulder ROM and function post operatively compared to baselines.  Baseline: 10/07/22 NEW  2 Pt will demonstrate 170 degrees of R shoulder abduction to allow her to reach out to the side.  10/07/22 NEW  3 Pt will demonstrate 165 degrees of R shoulder flexion to allow her to reach overhead.  10/07/22 NEW  4 Pt will report she has had a 75% improvement in her vertigo symptoms to allow her to look upwards without getting dizzy.  10/07/22 NEW  5 Pt will be independent in a home exercise program for continued stretching and strengthening.  10/07/22 NEW     PLAN:  PT FREQUENCY/DURATION: 2x/wk for 4 wks  PLAN FOR NEXT  SESSION: redo positional testing, Rt UE ROM   [facility]  [address], [address]  [place] [address]  [phone]  After Breast Cancer Class It is recommended you attend the ABC class to be educated on lymphedema risk reduction. This class is free of charge and lasts for 1 hour. It is a 1-time class. You will need to download the TEAMS app either on your phone or computer. We will send you a link the night before or the morning of  the class. You should be able to click on that link to join the class. This is not a confidential class. You don't have to turn your camera on, but other participants may be able to see your email address.  Scar massage You can begin gentle scar massage to you incision sites. Gently place one hand on the incision and move the skin (without sliding on the skin) in various directions. Do this for a few minutes and then you can gently massage either coconut oil or vitamin E cream into the scars.  Compression garment You should continue wearing your compression bra until you feel like you no longer have swelling.  Home exercise Program Continue doing the exercises you were given until you feel like you can do them without feeling any tightness at the end.   Walking Program Studies show that 30 minutes of walking per day (fast enough to elevate your heart rate) can significantly reduce the risk of a cancer recurrence. If you can't walk due to other medical reasons, we encourage you to find another activity you could do (like a stationary bike or water exercise).  Posture After breast cancer surgery, people frequently sit with rounded shoulders posture because it puts their incisions on slack and feels better. If you sit like this and scar tissue forms in that position, you can become very tight and have pain sitting or standing with good posture. Try to be aware of your posture and sit and stand up tall to heal properly.  Follow up PT: It is  recommended you return every 3 months for the first 3 years following surgery to be assessed on the SOZO machine for an L-Dex score. This helps prevent clinically significant lymphedema in 95% of patients. These follow up screens are 10 minute appointments that you are not billed for.  Idamae Lusher, PT 10/06/2022, 2:42 PM

## 2022-10-08 ENCOUNTER — Other Ambulatory Visit: Payer: Self-pay | Admitting: *Deleted

## 2022-10-08 ENCOUNTER — Ambulatory Visit: Payer: 59

## 2022-10-08 DIAGNOSIS — R293 Abnormal posture: Secondary | ICD-10-CM | POA: Diagnosis not present

## 2022-10-08 DIAGNOSIS — D0511 Intraductal carcinoma in situ of right breast: Secondary | ICD-10-CM

## 2022-10-08 DIAGNOSIS — Z483 Aftercare following surgery for neoplasm: Secondary | ICD-10-CM | POA: Diagnosis not present

## 2022-10-08 DIAGNOSIS — M25611 Stiffness of right shoulder, not elsewhere classified: Secondary | ICD-10-CM

## 2022-10-08 DIAGNOSIS — R42 Dizziness and giddiness: Secondary | ICD-10-CM | POA: Diagnosis not present

## 2022-10-08 NOTE — Therapy (Signed)
OUTPATIENT PHYSICAL THERAPY VESTIBULAR EVALUATION     Patient Name: Meghan Mcdonald MRN: [id number] DOB:[birth year], 67 y.o., female Today's Date: 10/09/2022  END OF SESSION:  PT End of Session - 10/09/22 1107     Visit Number 1    Number of Visits 13    Date for PT Re-Evaluation 11/20/22    Authorization Type [employer]    PT Start Time 1018    PT Stop Time 1104    PT Time Calculation (min) 46 min    Activity Tolerance Patient tolerated treatment well    Behavior During Therapy WFL for tasks assessed/performed             Past Medical History:  Diagnosis Date   Anemia    years ago   Anxiety    Arthritis    Breast cancer (HCC)    Chronic shoulder pain    COVID    x 2 both mild cases   Depression    Diabetes mellitus without complication (HCC)    GERD (gastroesophageal reflux disease)    History of kidney stones    Hyperlipemia    Hypertension    Insomnia    Lichen sclerosus of vulva    Migraine    Overweight    Rapid palpitations    Ventricular tachycardia (paroxysmal) (HCC)    Past Surgical History:  Procedure Laterality Date   BLADDER SURGERY     BREAST IMPLANT REMOVAL Right 09/19/2022   Procedure: REMOVAL RIGHT TISSUE EXPANDER;  Surgeon: Peggye Form, DO;  Location: [facility];  Service: Plastics;  Laterality: Right;   BREAST RECONSTRUCTION WITH PLACEMENT OF TISSUE EXPANDER AND FLEX HD (ACELLULAR HYDRATED DERMIS) Right 07/31/2022   Procedure: IMMEDIATE RIGHT BREAST RECONSTRUCTION WITH PLACEMENT OF TISSUE EXPANDER AND FLEX HD (ACELLULAR HYDRATED DERMIS);  Surgeon: Peggye Form, DO;  Location: [facility];  Service: Plastics;  Laterality: Right;   CARPAL TUNNEL RELEASE     CATARACT EXTRACTION Bilateral    CHOLECYSTECTOMY     MASTECTOMY W/ SENTINEL NODE BIOPSY Right 07/31/2022   Procedure: RIGHT MASTECTOMY WITH SENTINEL LYMPH NODE BIOPSY;  Surgeon: Griselda Miner, MD;  Location: [facility];  Service: General;  Laterality:  Right;   ORIF ANKLE FRACTURE Right 07/22/2018   Procedure: OPEN REDUCTION INTERNAL FIXATION (ORIF) RIGHT BIMALL ANKLE FRACTURE;  Surgeon: Terance Hart, MD;  Location: [facility];  Service: Orthopedics;  Laterality: Right;   ORIF TIBIA & FIBULA FRACTURES     TONSILLECTOMY     Patient Active Problem List   Diagnosis Date Noted   Malignant neoplasm of right breast in female, estrogen receptor positive (HCC) 08/11/2022   Breast cancer (HCC) 07/31/2022   S/P breast reconstruction 07/31/2022   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, estrogen receptor positive (HCC) 05/26/2022   Acute CVA (cerebrovascular accident) (HCC) 12/23/2017   Severe vertigo 12/22/2017   Vertigo 12/22/2017   Dyslipidemia 12/22/2017   Essential hypertension 12/22/2017   Nausea & vomiting 12/22/2017    PCP: Sheliah Hatch, PA-C  REFERRING PROVIDER: Malachy Mood, MD  REFERRING DIAG: D05.11 (ICD-10-CM) - Ductal carcinoma in situ (DCIS) of right breast R42 (ICD-10-CM) - Dizziness  THERAPY DIAG:  BPPV (benign paroxysmal positional vertigo), bilateral  Dizziness and giddiness  ONSET DATE: April 2024  Rationale for Evaluation and Treatment: Rehabilitation  SUBJECTIVE:   SUBJECTIVE STATEMENT: Patient reports that she had her last BF specialty appointment this week. Dizziness first started in 2019 and was very  severe- the room was spinning and was vomiting for 3 days. Was admitted d/t dehydration. Suspects that it was a viral thing in her L ear- had therapy which helped tremendously. Recently having brief episodes of "wobbles" with bending and coming back up, turning, getting out of bed, laying down in bed. Was doing the L Epley maneuver without benefit. Her therapist tried the R Epley with some benefit. Denies head trauma, infection/illness, vision changes/double vision, hearing loss. Reports chronic tinnitus and hx migraines. Reports migraines 1-2x/year but they were very  severe and often before menopause.    Pt accompanied by: self  PERTINENT HISTORY: Anemia, anxiety, breast CA s/p R mastectomy 07/31/22 and implant removal 09/09/22, depression, DM, GERD, HLD, HTN, migraine, palpitations, R ankle ORIF  PAIN:  Are you having pain? Yes: NPRS scale: mild/10 Pain location: R shoulder Pain description: sore Aggravating factors: stretching Relieving factors: -  PRECAUTIONS: recent surgeries, R UE lymphedema risk  WEIGHT BEARING RESTRICTIONS: No  FALLS: Has patient fallen in last 6 months? Yes. Number of falls reports frequent tripping and 1 fall into a bush, "I'm just clumsy."  LIVING ENVIRONMENT: Lives with: lives with their spouse Lives in: House/apartment Stairs:  2 steps to enter; 2 story home Has following equipment at home: Single point cane  PLOF: Independent; retired   PATIENT GOALS: improve dizziness   OBJECTIVE:   DIAGNOSTIC FINDINGS: none recent  COGNITION: Overall cognitive status: Within functional limits for tasks assessed   SENSATION: Reports plantar R foot N/T for years  GAIT: Gait pattern: WFL Assistive device utilized: None Level of assistance: Complete Independence   PATIENT SURVEYS:  FOTO not performed  VESTIBULAR ASSESSMENT:  GENERAL OBSERVATION: pt has implanted contacts for distance and reading    OCULOMOTOR EXAM:  Ocular Alignment: normal  Ocular ROM: No Limitations  Spontaneous Nystagmus: absent  Gaze-Induced Nystagmus: absent  Smooth Pursuits: intact  Saccades: intact  Convergence/Divergence: 4 inches  VESTIBULAR - OCULAR REFLEX:   Slow VOR: Normal c/o "eyes delayed" coming back from R head turn; retinal slip evident   VOR Cancellation: Normal  Head-Impulse Test: HIT Right: overtly positive HIT Left: positive      POSITIONAL TESTING:  Right Roll Test: R upbeating torsional nystagmus ; Duration: 20 sec Left Roll Test: negative   Right Sidelying: R upbeating torsional nystagmus and dizziness  sitting up; Duration: 60 sec Left Sidelying: L upbeating torsional nystagmus; Duration: 40 sec  Right Dix-Hallpike: R upbeating torsional nystagmus; Duration:10 sec Left Dix-Hallpike: L upbeating torsional nystagmus ; Duration: 60 sec    VESTIBULAR TREATMENT:                                                                                                   DATE: 10/09/22  Canalith Repositioning:  Epley Right: Number of Reps: 1, Response to Treatment: symptoms improved, and Comment: tolerated well   PATIENT EDUCATION: Education details: prognosis, POC, edu on exam findings and tx course Person educated: Patient Education method: Medical illustrator Education comprehension: verbalized understanding  HOME EXERCISE PROGRAM:  GOALS: Goals reviewed with patient? Yes  SHORT TERM GOALS: Target  date: 10/30/2022  Patient to be independent with initial HEP. Baseline: HEP initiated Goal status: INITIAL    LONG TERM GOALS: Target date: 11/20/2022  Patient to be independent with advanced HEP. Baseline: Not yet initiated  Goal status: INITIAL  Patient to report 0/10 dizziness with standing vertical and horizontal VOR for 30 seconds. Baseline: Unable Goal status: INITIAL  Patient will report 0/10 dizziness with bed mobility.  Baseline: Symptomatic  Goal status: INITIAL  Patient to demonstrate mild-moderate sway with M-CTSIB condition with eyes closed/foam surface in order to improve safety in environments with uneven surfaces and dim lighting. Baseline: NT Goal status: INITIAL  Patient to score at least 20/24 on DGI in order to decrease risk of falls. Baseline: NT Goal status: INITIAL    ASSESSMENT:  CLINICAL IMPRESSION:   Patient is a 67 y/o F presenting to OPPT with c/o dizziness for the past couple months. 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 Since return of her symptoms, reports  dizziness with bending and coming back up, turning, getting out of bed, laying down in bed. Denies head trauma, infection/illness, vision changes/double vision, hearing loss. Reports chronic tinnitus and hx migraines. Exam today revealed convergence insufficiency, sensation of "delay" with VOR, positive R>L HIT, possible R and L posterior canalithiasis. Patient treated with R Epley and tolerated this well. 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 False positive 2/2 hx of severe migraines. Would benefit from skilled PT services 1-2x/week for 6 weeks to address aforementioned impairments in order to optimize level of function.    OBJECTIVE IMPAIRMENTS: decreased activity tolerance, decreased balance, and dizziness.   ACTIVITY LIMITATIONS: carrying, lifting, bending, sitting, standing, squatting, sleeping, stairs, transfers, bed mobility, bathing, dressing, reach over head, and hygiene/grooming  PARTICIPATION LIMITATIONS: meal prep, cleaning, laundry, driving, shopping, community activity, yard work, and church  PERSONAL FACTORS: Age, Past/current experiences, Time since onset of injury/illness/exacerbation, and 3+ comorbidities: Anemia, anxiety, breast CA s/p R mastectomy 07/31/22 and implant removal 09/09/22, depression, DM, GERD, HLD, HTN, migraine, palpitations, R ankle ORIF  are also affecting patient's functional outcome.   REHAB POTENTIAL: Good  CLINICAL DECISION MAKING: Evolving/moderate complexity  EVALUATION COMPLEXITY: Moderate   PLAN:  PT FREQUENCY: 1-2x/week  PT DURATION: 6 weeks  PLANNED INTERVENTIONS: Therapeutic exercises, Therapeutic activity, Neuromuscular re-education, Balance training, Gait training, Patient/Family education, Self Care, Joint mobilization, Stair training, Vestibular training, Canalith repositioning, Dry Needling, Electrical stimulation, Cryotherapy, Moist heat, Taping, Manual therapy, and  Re-evaluation  PLAN FOR NEXT SESSION: reassess R/L DH and treat as needed; MCTSIB, DGI, start on habituation and VOR HEP   Anette Guarneri, PT, DPT 10/09/22 11:16 AM  [facility] [address], [address] [place], [place] [address] Phone # [phone] Fax # [phone]

## 2022-10-08 NOTE — Therapy (Signed)
OUTPATIENT PHYSICAL THERAPY BREAST CANCER TREATMENT   Patient Name: Meghan Mcdonald MRN: [id number] DOB:1955-10-13, 67 y.o., female Today's Date: 10/08/2022  END OF SESSION:  PT End of Session - 10/08/22 1223     Visit Number 7    Number of Visits 11    Date for PT Re-Evaluation 10/07/22    PT Start Time 1202    PT Stop Time 1300    PT Time Calculation (min) 58 min    Activity Tolerance Patient tolerated treatment well    Behavior During Therapy WFL for tasks assessed/performed             Past Medical History:  Diagnosis Date   Anemia    years ago   Anxiety    Arthritis    Breast cancer (HCC)    Chronic shoulder pain    COVID    x 2 both mild cases   Depression    Diabetes mellitus without complication (HCC)    GERD (gastroesophageal reflux disease)    History of kidney stones    Hyperlipemia    Hypertension    Insomnia    Lichen sclerosus of vulva    Migraine    Overweight    Rapid palpitations    Ventricular tachycardia (paroxysmal) (HCC)    Past Surgical History:  Procedure Laterality Date   BLADDER SURGERY     BREAST IMPLANT REMOVAL Right 09/19/2022   Procedure: REMOVAL RIGHT TISSUE EXPANDER;  Surgeon: Peggye Form, DO;  Location: [facility];  Service: Plastics;  Laterality: Right;   BREAST RECONSTRUCTION WITH PLACEMENT OF TISSUE EXPANDER AND FLEX HD (ACELLULAR HYDRATED DERMIS) Right 07/31/2022   Procedure: IMMEDIATE RIGHT BREAST RECONSTRUCTION WITH PLACEMENT OF TISSUE EXPANDER AND FLEX HD (ACELLULAR HYDRATED DERMIS);  Surgeon: Peggye Form, DO;  Location: [facility];  Service: Plastics;  Laterality: Right;   CARPAL TUNNEL RELEASE     CATARACT EXTRACTION Bilateral    CHOLECYSTECTOMY     MASTECTOMY W/ SENTINEL NODE BIOPSY Right 07/31/2022   Procedure: RIGHT MASTECTOMY WITH SENTINEL LYMPH NODE BIOPSY;  Surgeon: Griselda Miner, MD;  Location: [facility];  Service: General;  Laterality: Right;   ORIF ANKLE FRACTURE Right  07/22/2018   Procedure: OPEN REDUCTION INTERNAL FIXATION (ORIF) RIGHT BIMALL ANKLE FRACTURE;  Surgeon: Terance Hart, MD;  Location: [facility];  Service: Orthopedics;  Laterality: Right;   ORIF TIBIA & FIBULA FRACTURES     TONSILLECTOMY     Patient Active Problem List   Diagnosis Date Noted   Malignant neoplasm of right breast in female, estrogen receptor positive (HCC) 08/11/2022   Breast cancer (HCC) 07/31/2022   S/P breast reconstruction 07/31/2022   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, estrogen receptor positive (HCC) 05/26/2022   Acute CVA (cerebrovascular accident) (HCC) 12/23/2017   Severe vertigo 12/22/2017   Vertigo 12/22/2017   Dyslipidemia 12/22/2017   Essential hypertension 12/22/2017   Nausea & vomiting 12/22/2017    PCP: Tera Helper, PA-C  REFERRING PROVIDER: Malachy Mood, MD   REFERRING DIAG: D05.11 (ICD-10-CM) - Ductal carcinoma in situ (DCIS) of right breast   THERAPY DIAG:  Stiffness of right shoulder, not elsewhere classified  Aftercare following surgery for neoplasm  Dizziness and giddiness  Abnormal posture  Ductal carcinoma in situ (DCIS) of right breast  Rationale for Evaluation and Treatment: Rehabilitation  ONSET DATE: 05/14/22  SUBJECTIVE:  SUBJECTIVE STATEMENT: 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 The dizziness overall isn't quite as bad as it was but I do still get it most of the time when I stand up, roll over in bed or turn too fast. I would like to explore vestibular PT options to see if that can get any better. I'm feel like I'm doing well with the cancer side of things.   PERTINENT HISTORY:  Patient was diagnosed  on 05/14/22 with R breast cancer. She had a total of 3 biopsies: 1 on 05/14/22: ER+ PR+ DCIS, 05/21/22: DCIS 2/5 mm ER/PR+, 06/11/22: DCIS grade 2, ER+, PR- Ki67: 20%, Hx of diabetes, hypertension. 07/31/22- R mastectomy SLNB 0/2. Had placement of expanders  PATIENT GOALS:  Reassess how my recovery is going related to arm function, pain, and swelling.  PAIN:  Are you having pain? No just sore across my shoulders from volunteering this morning which included moving frozen meats.   PRECAUTIONS: Recent Surgery, right UE Lymphedema risk, pins and plates in R ankle  ACTIVITY LEVEL / LEISURE: pt has been planting the garden and pulling weeds   OBJECTIVE:   OBSERVATIONS: Quarter size area in middle of incision is still healing with yellow exudate - covered with a bandage, area of redness towards medial breast that pt reports is itchy - she does report that this area has gotten smaller since yesterday  POSTURE:  Forward head and rounded shoulders posture   UPPER EXTREMITY AROM/PROM:   A/PROM RIGHT   eval   RIGHT 09/09/22 10/02/22 10/06/22   Shoulder extension 66 72     Shoulder flexion 165 155 148 157 157  Shoulder abduction 179 127 125 160 169  Shoulder internal rotation 62 65     Shoulder external rotation 85 80 85                            (Blank rows = not tested)   A/PROM LEFT   eval  Shoulder extension 66  Shoulder flexion 162  Shoulder abduction 180  Shoulder internal rotation 55  Shoulder external rotation 83                          (Blank rows = not tested)   CERVICAL AROM: All within normal limits:      Percent limited  Flexion WFL  Extension WFL  Right lateral flexion WFL  Left lateral flexion WFL  Right rotation WFL  Left rotation WFL      UPPER EXTREMITY STRENGTH: 4+/5   LYMPHEDEMA ASSESSMENTS:    LANDMARK RIGHT   eval RIGHT  09/09/22  10 cm proximal to olecranon process 26.7 25.5  Olecranon process 24.5 24  10  cm proximal to ulnar styloid process 21.7  20.9  Just proximal to ulnar styloid process 15.2 15  Across hand at thumb web space 19.7 18.4  At base of 2nd digit 6.4 6.2  (Blank rows = not tested)   LANDMARK LEFT   eval  10 cm proximal to olecranon process 25.3  Olecranon process 24.3  10 cm proximal to ulnar styloid process 21.1  Just proximal to ulnar styloid process 15.4  Across hand at thumb web space 18.5  At base of 2nd digit 6.2  (Blank rows = not tested)    Surgery type/Date: 07/31/22 R mastectomy  and SLNB Number of lymph nodes removed: 0/2 Current/past treatment (chemo, radiation, hormone therapy): pt will begin anastrozole in June - will not need chemo or radiation Other symptoms:  Heaviness/tightness Yes Pain No Pitting edema No Infections No Decreased scar mobility: still healing Stemmer sign No  TODAY"S TREATMENT 10/08/22: Therapeutic Exercises Supine scapular series with yellow theraband (red issued to progress to later) x 10 each returning therapist demo for each Manual Therapy P/ROM to Rt shoulder into flexion, abd and D2 to pts available end motions STM to Rt lateral trunk and Rt pectoralis insertion  Vestibular Recheck (performed by Nedra Hai PT)  Ocular ROM WNL Smooth pursuits WNL No spontaneous nystagmus noted Divergence about 10cm Ocular alignment WNL  VOR WNL Saccades WNL VOR cancellation WNL  R DH (+), L DH negative  Horizontal canal testing caused possible upbeating vertigo R side, did not note any torsional component but difficult as pt had eyes partly shut    10/06/22 Checked incision healing. Healing well - stitches present with scabbing middle of horizontal incision.  PROM stopping prior to resistance for flexion, abduction, ER STM Rt pectoralis with cocoa butter Pt experienced one episode of vertigo around 10-15 minutes after laying supine that appeared Lt torsional and lasted around 6sec Pulleys into flexion and abduction x each Doorway stretch 3x15" with initial  instruction Pt okayed to start doing post op exercises again.   10/02/22 Now after surgery with instruction for gentle motion only until incision healing PROM stopping prior to resistance for flexion, abduction, ER STM Rt pectoralis with cocoa butter Pt experienced one episode of vertigo around 5-10 minutes after laying supine that appeared Lt torsional and lasted around [employer] other vestibular therapist regarding more spontaneous presentation.   09/17/22 VESTIBULAR ASSESSMENT:  SYMPTOM BEHAVIOR:  Subjective history: see above  Non-Vestibular symptoms: BP low, Lt ear feels a bit full  Type of dizziness: Spinning/Vertigo  Frequency: head turns especially rolling and head turns and supine to sit  Duration: 30-40sec  Progression of symptoms: worse after surgery  OCULOMOTOR EXAM:  Ocular Alignment: normal  Ocular ROM: No Limitations  Spontaneous Nystagmus: absent  Gaze-Induced Nystagmus: absent  Smooth Pursuits: intact  Saccades: intact   VESTIBULAR - OCULAR REFLEX:   Head-Impulse Test: HIT Right: negative, Left negative     POSITIONAL TESTING: DHM left negative, DHM right positive with nystagmus noted x 12 seconds - moved right into CRM and then education on doing her epley correctly and for the Rt side.    PATIENT EDUCATION:  Education details: post op exercises Person educated: Patient Education method: Chief Technology Officer Education comprehension: verbalized understanding  HOME EXERCISE PROGRAM: Reviewed previously given post op HEP.   ASSESSMENT:  CLINICAL IMPRESSION: Pts HEP was progressed today to include supine scapular series. Her A/ROM has improved as well meeting that goal. Overall pt has done well with her cancer rehab goals and will be D/C from [place] today. She is being referred to vestibular rehab for the dizziness.   Impression from vestibular recheck/re-eval by PT:   Still looks to have some R posterior canal BPPV, leaning towards possible  cupulolithiasis at this point. Also had some nystagmus with roll test in supine for horizontal canal, difficult to tell which direction due to closing eyes. Could not roll out multiple canal BPPV at this point- might benefit from referral to vestibular specialist for further care   Pt will benefit from skilled therapeutic intervention to improve on the following deficits: Decreased knowledge of precautions,  impaired UE functional use, pain, decreased ROM, postural dysfunction, increased dizziness  PT treatment/interventions: ADL/Self care home management, Therapeutic exercises, Therapeutic activity, Patient/Family education, Self Care, Joint mobilization, Canalith repositioning, Orthotic/Fit training, Manual lymph drainage, Compression bandaging, scar mobilization, Taping, Vasopneumatic device, Manual therapy, and Re-evaluation   GOALS: Goals reviewed with patient? Yes  LONG TERM GOALS:  (STG=LTG)  GOALS Name Target Date  Goal status  1 Pt will demonstrate she has regained full shoulder ROM and function post operatively compared to baselines.  Baseline: 10/07/22 PARTIALLY MET 10/08/22   2 Pt will demonstrate 170 degrees of R shoulder abduction to allow her to reach out to the side.  10/07/22 MET 10/08/22 - 170 degrees  3 Pt will demonstrate 165 degrees of R shoulder flexion to allow her to reach overhead.  10/07/22 PROGRESS NOTED  10/08/22 - 157 degrees  4 Pt will report she has had a 75% improvement in her vertigo symptoms to allow her to look upwards without getting dizzy.  10/07/22 PARTIALLY MET 10/08/22 - 60% since PT started  5 Pt will be independent in a home exercise program for continued stretching and strengthening.  10/07/22 MET 10/08/22      PLAN:  PT FREQUENCY/DURATION: 2x/wk for 4 wks  PLAN FOR NEXT SESSION: D/C this visit. Pt was referred to Neuro Rehab for vestibular follow up by Nedra Hai, PT   Hermenia Bers, PTA 10/08/2022, 1:08 PM   Nedra Hai, PT,  DPT 10/08/22 1:56 PM    Over Head Pull: Narrow and Wide Grip   Cancer Rehab [phone]   On back, knees bent, feet flat, band across thighs, elbows straight but relaxed. Pull hands apart (start). Keeping elbows straight, bring arms up and over head, hands toward floor. Keep pull steady on band. Hold momentarily. Return slowly, keeping pull steady, back to start. Then do same with a wider grip on the band (past shoulder width) Repeat _5-10__ times. Band color __yellow____   Side Pull: Double Arm   On back, knees bent, feet flat. Arms perpendicular to body, shoulder level, elbows straight but relaxed. Pull arms out to sides, elbows straight. Resistance band comes across collarbones, hands toward floor. Hold momentarily. Slowly return to starting position. Repeat _5-10__ times. Band color _yellow____   Sword   On back, knees bent, feet flat, left hand on left hip, right hand above left. Pull right arm DIAGONALLY (hip to shoulder) across chest. Bring right arm along head toward floor. Hold momentarily. Slowly return to starting position. Repeat _5-10__ times. Do with left arm. Band color _yellow_____   Shoulder Rotation: Double Arm   On back, knees bent, feet flat, elbows tucked at sides, bent 90, hands palms up. Pull hands apart and down toward floor, keeping elbows near sides. Hold momentarily. Slowly return to starting position. Repeat _5-10__ times. Band color __yellow____     PHYSICAL THERAPY DISCHARGE SUMMARY  Visits from Start of Care: 7  Current functional level related to goals / functional outcomes: Goals met; see above for objective measurements   Remaining deficits: Dizziness present   Education / Equipment: HEP   Patient agrees to discharge. Patient goals were met. Patient is being discharged due to meeting the stated rehab goals. It is recommended that she undergo P.T for dizziness so she was been referred for that.  Bethann Punches,  10/09/22 1:58 PM

## 2022-10-09 ENCOUNTER — Ambulatory Visit: Payer: 59 | Attending: Hematology | Admitting: Physical Therapy

## 2022-10-09 ENCOUNTER — Encounter: Payer: Self-pay | Admitting: Physical Therapy

## 2022-10-09 ENCOUNTER — Other Ambulatory Visit: Payer: Self-pay

## 2022-10-09 DIAGNOSIS — H8113 Benign paroxysmal vertigo, bilateral: Secondary | ICD-10-CM | POA: Insufficient documentation

## 2022-10-09 DIAGNOSIS — D0511 Intraductal carcinoma in situ of right breast: Secondary | ICD-10-CM | POA: Diagnosis not present

## 2022-10-09 DIAGNOSIS — R42 Dizziness and giddiness: Secondary | ICD-10-CM | POA: Insufficient documentation

## 2022-10-13 NOTE — Therapy (Signed)
OUTPATIENT PHYSICAL THERAPY VESTIBULAR TREATMENT     Patient Name: Meghan Mcdonald MRN: [id number] DOB:02/15/1956, 67 y.o., female Today's Date: 10/14/2022  END OF SESSION:  PT End of Session - 10/14/22 0929     Visit Number 2    Number of Visits 13    Date for PT Re-Evaluation 11/20/22    Authorization Type Aet[employer]    PT Start Time [id number]    PT Stop Time 0929    PT Time Calculation (min) 45 min    Equipment Utilized During Treatment Gait belt    Activity Tolerance Patient tolerated treatment well    Behavior During Therapy WFL for tasks assessed/performed              Past Medical History:  Diagnosis Date   Anemia    years ago   Anxiety    Arthritis    Breast cancer (HCC)    Chronic shoulder pain    COVID    x 2 both mild cases   Depression    Diabetes mellitus without complication (HCC)    GERD (gastroesophageal reflux disease)    History of kidney stones    Hyperlipemia    Hypertension    Insomnia    Lichen sclerosus of vulva    Migraine    Overweight    Rapid palpitations    Ventricular tachycardia (paroxysmal) (HCC)    Past Surgical History:  Procedure Laterality Date   BLADDER SURGERY     BREAST IMPLANT REMOVAL Right 09/19/2022   Procedure: REMOVAL RIGHT TISSUE EXPANDER;  Surgeon: Peggye Form, DO;  Location: [facility];  Service: Plastics;  Laterality: Right;   BREAST RECONSTRUCTION WITH PLACEMENT OF TISSUE EXPANDER AND FLEX HD (ACELLULAR HYDRATED DERMIS) Right 07/31/2022   Procedure: IMMEDIATE RIGHT BREAST RECONSTRUCTION WITH PLACEMENT OF TISSUE EXPANDER AND FLEX HD (ACELLULAR HYDRATED DERMIS);  Surgeon: Peggye Form, DO;  Location: [facility];  Service: Plastics;  Laterality: Right;   CARPAL TUNNEL RELEASE     CATARACT EXTRACTION Bilateral    CHOLECYSTECTOMY     MASTECTOMY W/ SENTINEL NODE BIOPSY Right 07/31/2022   Procedure: RIGHT MASTECTOMY WITH SENTINEL LYMPH NODE BIOPSY;  Surgeon: Griselda Miner, MD;  Location: [facility];  Service: General;  Laterality: Right;   ORIF ANKLE FRACTURE Right 07/22/2018   Procedure: OPEN REDUCTION INTERNAL FIXATION (ORIF) RIGHT BIMALL ANKLE FRACTURE;  Surgeon: Terance Hart, MD;  Location: [facility];  Service: Orthopedics;  Laterality: Right;   ORIF TIBIA & FIBULA FRACTURES     TONSILLECTOMY     Patient Active Problem List   Diagnosis Date Noted   Malignant neoplasm of right breast in female, estrogen receptor positive (HCC) 08/11/2022   Breast cancer (HCC) 07/31/2022   S/P breast reconstruction 07/31/2022   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, estrogen receptor positive (HCC) 05/26/2022   Acute CVA (cerebrovascular accident) (HCC) 12/23/2017   Severe vertigo 12/22/2017   Vertigo 12/22/2017   Dyslipidemia 12/22/2017   Essential hypertension 12/22/2017   Nausea & vomiting 12/22/2017    PCP: Verl Blalock  REFERRING PROVIDER: Malachy Mood, MD  REFERRING DIAG: D05.11 (ICD-10-CM) - Ductal carcinoma in situ (DCIS) of right breast R42 (ICD-10-CM) - Dizziness  THERAPY DIAG:  BPPV (benign paroxysmal positional vertigo), bilateral  Dizziness and giddiness  ONSET DATE: April 2024  Rationale for Evaluation and Treatment: Rehabilitation  SUBJECTIVE:   SUBJECTIVE STATEMENT: My general symptoms have improved- still having dizziness rolling and  getting up at night. Also noticed some light sensitivity when getting out of the car this AM.    Pt accompanied by: self  PERTINENT HISTORY: Anemia, anxiety, breast CA s/p R mastectomy 07/31/22 and implant removal 09/09/22, depression, DM, GERD, HLD, HTN, migraine, palpitations, R ankle ORIF  PAIN:  Are you having pain? Yes: NPRS scale: 0/10 Pain location: R shoulder Pain description: sore Aggravating factors: stretching Relieving factors: -  PRECAUTIONS: recent surgeries, R UE lymphedema risk  WEIGHT BEARING RESTRICTIONS: No  FALLS: Has patient fallen  in last 6 months? Yes. Number of falls reports frequent tripping and 1 fall into a bush, "I'm just clumsy."  LIVING ENVIRONMENT: Lives with: lives with their spouse Lives in: House/apartment Stairs:  2 steps to enter; 2 story home Has following equipment at home: Single point cane  PLOF: Independent; retired   PATIENT GOALS: improve dizziness   OBJECTIVE:      TODAY'S TREATMENT: 10/14/22   M-CTSIB  Condition 1: Firm Surface, EO 30 Sec, Mild and Moderate Sway  Condition 2: Firm Surface, EC 30 Sec, Mild and Moderate Sway  Condition 3: Foam Surface, EO 30 Sec, Mild and Moderate Sway  Condition 4: Foam Surface, EC 5-10 Sec, Severe Sway; LOB to R      Activity Comments  DGI 22/24  R sidelying test Latent R upbeating torsional nystagmus persistent ; c/o dizziness upon laying down and sitting up  R semont  Tolerated well   R sidelying test  latent R upbeating torsional nystagmus lasting 45 sec  R DH Latent R upbeating torsional nystagmus lasting 45 sec  R Epley  Tolerated well; small amplitude L upbeating torsional nystagmus lasting ~30 sec upon L head turn   R DH Latent smaller amplitude R upbeating torsional nystagmus lasting 25 sec         [facility] PT Assessment - 10/14/22 0001       Standardized Balance Assessment   Standardized Balance Assessment Dynamic Gait Index      Dynamic Gait Index   Level Surface Normal    Change in Gait Speed Normal    Gait with Horizontal Head Turns Mild Impairment    Gait with Vertical Head Turns Mild Impairment    Gait and Pivot Turn Normal    Step Over Obstacle Normal    Step Around Obstacles Normal    Steps Normal    Total Score 22               HOME EXERCISE PROGRAM Last updated: 10/14/22 Access Code: [email or web address] URL: [email or web address] Date: 10/14/2022 Prepared by: [facility] - Outpatient  Rehab - [facility]  Exercises - Brandt-Daroff Vestibular Exercise  - 1 x daily - 5 x weekly - 2 sets - 3-5  reps - Seated Gaze Stabilization with Head Rotation  - 1 x daily - 5 x weekly - 2-3 sets - 30 sec hold - Seated Gaze Stabilization with Head Nod  - 1 x daily - 5 x weekly - 2-3 sets - 30 sec hold    PATIENT EDUCATION: Education details: HEP with edu on safety and intended level of sx  Person educated: Patient Education method: Explanation, Demonstration, Tactile cues, Verbal cues, and Handouts Education comprehension: verbalized understanding and returned demonstration    Below measures were taken at time of initial evaluation unless otherwise specified:   DIAGNOSTIC FINDINGS: none recent  COGNITION: Overall cognitive status: Within functional limits for tasks assessed   SENSATION: Reports plantar R foot N/T for years  GAIT:  Gait pattern: [facility] Assistive device utilized: None Level of assistance: Complete Independence   PATIENT SURVEYS:  FOTO not performed  VESTIBULAR ASSESSMENT:  GENERAL OBSERVATION: pt has implanted contacts for distance and reading    OCULOMOTOR EXAM:  Ocular Alignment: normal  Ocular ROM: No Limitations  Spontaneous Nystagmus: absent  Gaze-Induced Nystagmus: absent  Smooth Pursuits: intact  Saccades: intact  Convergence/Divergence: 4 inches  VESTIBULAR - OCULAR REFLEX:   Slow VOR: Normal c/o "eyes delayed" coming back from R head turn; retinal slip evident   VOR Cancellation: Normal  Head-Impulse Test: HIT Right: overtly positive HIT Left: positive      POSITIONAL TESTING:  Right Roll Test: R upbeating torsional nystagmus ; Duration: 20 sec Left Roll Test: negative   Right Sidelying: R upbeating torsional nystagmus and dizziness sitting up; Duration: 60 sec Left Sidelying: L upbeating torsional nystagmus; Duration: 40 sec  Right Dix-Hallpike: R upbeating torsional nystagmus; Duration:10 sec Left Dix-Hallpike: L upbeating torsional nystagmus ; Duration: 60 sec    VESTIBULAR TREATMENT:                                                                                                    DATE: 10/09/22  Canalith Repositioning:  Epley Right: Number of Reps: 1, Response to Treatment: symptoms improved, and Comment: tolerated well   PATIENT EDUCATION: Education details: prognosis, POC, edu on exam findings and tx course Person educated: Patient Education method: Medical illustrator Education comprehension: verbalized understanding  HOME EXERCISE PROGRAM:  GOALS: Goals reviewed with patient? Yes  SHORT TERM GOALS: Target date: 10/30/2022  Patient to be independent with initial HEP. Baseline: HEP initiated Goal status: IN PROGRESS    LONG TERM GOALS: Target date: 11/20/2022  Patient to be independent with advanced HEP. Baseline: Not yet initiated  Goal status: IN PROGRESS  Patient to report 0/10 dizziness with standing vertical and horizontal VOR for 30 seconds. Baseline: Unable Goal status: IN PROGRESS  Patient will report 0/10 dizziness with bed mobility.  Baseline: Symptomatic  Goal status: IN PROGRESS  Patient to demonstrate mild-moderate sway with M-CTSIB condition with eyes closed/foam surface in order to improve safety in environments with uneven surfaces and dim lighting. Baseline: severe 10 sec 10/13/22 Goal status: IN PROGRESS 10/13/22  Patient to score at least 20/24 on DGI in order to decrease risk of falls. Baseline: 22/24 10/13/22 Goal status: MET 10/13/22    ASSESSMENT:  CLINICAL IMPRESSION:   Patient arrived to session with report of improved dizziness however still noting dizziness rolling and getting out of bed. Balance testing revealed decreased use of vestibular system but scored with a low risk of falls on DGI. Positional testing indicated R posterior Cupulolithiasis, treated with R Semont which converted to R posterior Canalithiasis. Patient tolerated R epley x1 which improved symptoms. Patient was educated on habituation HEP and denied complaints upon leaving.   OBJECTIVE  IMPAIRMENTS: decreased activity tolerance, decreased balance, and dizziness.   ACTIVITY LIMITATIONS: carrying, lifting, bending, sitting, standing, squatting, sleeping, stairs, transfers, bed mobility, bathing, dressing, reach over head, and hygiene/grooming  PARTICIPATION LIMITATIONS: meal prep, cleaning, laundry,  driving, shopping, community activity, yard work, and church  PERSONAL FACTORS: Age, Past/current experiences, Time since onset of injury/illness/exacerbation, and 3+ comorbidities: Anemia, anxiety, breast CA s/p R mastectomy 07/31/22 and implant removal 09/09/22, depression, DM, GERD, HLD, HTN, migraine, palpitations, R ankle ORIF  are also affecting patient's functional outcome.   REHAB POTENTIAL: Good  CLINICAL DECISION MAKING: Evolving/moderate complexity  EVALUATION COMPLEXITY: Moderate   PLAN:  PT FREQUENCY: 1-2x/week  PT DURATION: 6 weeks  PLANNED INTERVENTIONS: Therapeutic exercises, Therapeutic activity, Neuromuscular re-education, Balance training, Gait training, Patient/Family education, Self Care, Joint mobilization, Stair training, Vestibular training, Canalith repositioning, Dry Needling, Electrical stimulation, Cryotherapy, Moist heat, Taping, Manual therapy, and Re-evaluation  PLAN FOR NEXT SESSION: reassess R/L DH and treat as needed;review HEP   Anette Guarneri, PT, DPT 10/14/22 9:30 AM  [facility] at [facility] [address], [address] [place], [place] [address] Phone # [phone] Fax # [phone]

## 2022-10-14 ENCOUNTER — Encounter: Payer: Self-pay | Admitting: Physical Therapy

## 2022-10-14 ENCOUNTER — Ambulatory Visit: Payer: 59 | Admitting: Physical Therapy

## 2022-10-14 DIAGNOSIS — H8113 Benign paroxysmal vertigo, bilateral: Secondary | ICD-10-CM

## 2022-10-14 DIAGNOSIS — D0511 Intraductal carcinoma in situ of right breast: Secondary | ICD-10-CM | POA: Diagnosis not present

## 2022-10-14 DIAGNOSIS — R42 Dizziness and giddiness: Secondary | ICD-10-CM | POA: Diagnosis not present

## 2022-10-16 ENCOUNTER — Encounter: Payer: Self-pay | Admitting: Physical Therapy

## 2022-10-16 ENCOUNTER — Ambulatory Visit (INDEPENDENT_AMBULATORY_CARE_PROVIDER_SITE_OTHER): Payer: 59 | Admitting: Physician Assistant

## 2022-10-16 ENCOUNTER — Ambulatory Visit: Payer: 59 | Admitting: Physical Therapy

## 2022-10-16 VITALS — BP 158/81 | HR 70

## 2022-10-16 DIAGNOSIS — H8113 Benign paroxysmal vertigo, bilateral: Secondary | ICD-10-CM | POA: Diagnosis not present

## 2022-10-16 DIAGNOSIS — R42 Dizziness and giddiness: Secondary | ICD-10-CM

## 2022-10-16 DIAGNOSIS — Z9889 Other specified postprocedural states: Secondary | ICD-10-CM

## 2022-10-16 DIAGNOSIS — Z170 Estrogen receptor positive status [ER+]: Secondary | ICD-10-CM

## 2022-10-16 DIAGNOSIS — D0511 Intraductal carcinoma in situ of right breast: Secondary | ICD-10-CM | POA: Diagnosis not present

## 2022-10-16 NOTE — Progress Notes (Signed)
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 Ulice Bold who returns to clinic for postoperative follow-up.   She was last seen here in clinic on 09/29/2022.  At that time, volume output was minimal and her exam was largely reassuring.  Residual slough noted centrally from which she was having scant yellow drainage.  No other evidence otherwise concerning for infection.  Discussed ongoing activity modifications and compressive garments.  Replaced bordered Mepilex dressing.  Drain removed without complication or difficulty.  Return in 2 weeks.  Today, patient is doing well.  She states that she has some scattered sutures across the expander removal site which she is hopeful for removal today.  Her appetite has finally returned and she is finally feeling herself again.  She is pleased with her recovery and is not interested in further reconstruction efforts until she returns home from [place].  Her trip is planned for August.    On exam, incision CDI.  She does have a Band-Aid over her drain removal site because there has been some irritation/friction injury from her bra.  However, remainder of her surgical site is well-healed.  Scattered sutures removed without complication or difficulty.  She does have some firmness on the inferior aspect of her right-sided mastectomy site which she states was present prior to her expander removal.  Recommendation is for frequent mechanical massage as it would likely soften with time.  Recommend that she return to clinic in 3 months for consult with Dr. Ulice Bold to discuss reconstruction options.  Picture(s) obtained of the patient and placed in the chart were with the patient's or guardian's permission.

## 2022-10-16 NOTE — Therapy (Signed)
OUTPATIENT PHYSICAL THERAPY VESTIBULAR TREATMENT     Patient Name: Meghan Mcdonald MRN: [id number] DOB:03/03/1956, 67 y.o., female Today's Date: 10/16/2022  END OF SESSION:  PT End of Session - 10/16/22 1147     Visit Number 3    Number of Visits 13    Date for PT Re-Evaluation 11/20/22    Authorization Type Aetna    PT Start Time 1148    PT Stop Time 1220    PT Time Calculation (min) 32 min    Equipment Utilized During Treatment Gait belt    Activity Tolerance Patient tolerated treatment well    Behavior During Therapy WFL for tasks assessed/performed              Past Medical History:  Diagnosis Date   Anemia    years ago   Anxiety    Arthritis    Breast cancer (HCC)    Chronic shoulder pain    COVID    x 2 both mild cases   Depression    Diabetes mellitus without complication (HCC)    GERD (gastroesophageal reflux disease)    History of kidney stones    Hyperlipemia    Hypertension    Insomnia    Lichen sclerosus of vulva    Migraine    Overweight    Rapid palpitations    Ventricular tachycardia (paroxysmal) (HCC)    Past Surgical History:  Procedure Laterality Date   BLADDER SURGERY     BREAST IMPLANT REMOVAL Right 09/19/2022   Procedure: REMOVAL RIGHT TISSUE EXPANDER;  Surgeon: Peggye Form, DO;  Location: [facility];  Service: Plastics;  Laterality: Right;   BREAST RECONSTRUCTION WITH PLACEMENT OF TISSUE EXPANDER AND FLEX HD (ACELLULAR HYDRATED DERMIS) Right 07/31/2022   Procedure: IMMEDIATE RIGHT BREAST RECONSTRUCTION WITH PLACEMENT OF TISSUE EXPANDER AND FLEX HD (ACELLULAR HYDRATED DERMIS);  Surgeon: Peggye Form, DO;  Location: [facility];  Service: Plastics;  Laterality: Right;   CARPAL TUNNEL RELEASE     CATARACT EXTRACTION Bilateral    CHOLECYSTECTOMY     MASTECTOMY W/ SENTINEL NODE BIOPSY Right 07/31/2022   Procedure: RIGHT MASTECTOMY WITH SENTINEL LYMPH NODE BIOPSY;  Surgeon: Griselda Miner, MD;  Location: [facility];  Service: General;  Laterality: Right;   ORIF ANKLE FRACTURE Right 07/22/2018   Procedure: OPEN REDUCTION INTERNAL FIXATION (ORIF) RIGHT BIMALL ANKLE FRACTURE;  Surgeon: Terance Hart, MD;  Location: [facility];  Service: Orthopedics;  Laterality: Right;   ORIF TIBIA & FIBULA FRACTURES     TONSILLECTOMY     Patient Active Problem List   Diagnosis Date Noted   Malignant neoplasm of right breast in female, estrogen receptor positive (HCC) 08/11/2022   Breast cancer (HCC) 07/31/2022   S/P breast reconstruction 07/31/2022   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, estrogen receptor positive (HCC) 05/26/2022   Acute CVA (cerebrovascular accident) (HCC) 12/23/2017   Severe vertigo 12/22/2017   Vertigo 12/22/2017   Dyslipidemia 12/22/2017   Essential hypertension 12/22/2017   Nausea & vomiting 12/22/2017    PCP: Verl Blalock  REFERRING PROVIDER: Malachy Mood, MD  REFERRING DIAG: D05.11 (ICD-10-CM) - Ductal carcinoma in situ (DCIS) of right breast R42 (ICD-10-CM) - Dizziness  THERAPY DIAG:  Dizziness and giddiness  BPPV (benign paroxysmal positional vertigo), bilateral  ONSET DATE: April 2024  Rationale for Evaluation and Treatment: Rehabilitation  SUBJECTIVE:   SUBJECTIVE STATEMENT: Overall, the symptoms are improved and less intense.  Did  decide yesterday to clean the ceiling fans looking up with 3-4 seconds vertigo.  Still a little dizziness with exercises to the R, but not to the left.    Pt accompanied by: self  PERTINENT HISTORY: Anemia, anxiety, breast CA s/p R mastectomy 07/31/22 and implant removal 09/09/22, depression, DM, GERD, HLD, HTN, migraine, palpitations, R ankle ORIF  PAIN:  Are you having pain? Yes: NPRS scale: 0/10 Pain location: R shoulder Pain description: sore Aggravating factors: stretching Relieving factors: -  PRECAUTIONS: recent surgeries, R UE lymphedema risk  WEIGHT BEARING  RESTRICTIONS: No  FALLS: Has patient fallen in last 6 months? Yes. Number of falls reports frequent tripping and 1 fall into a bush, "I'm just clumsy."  LIVING ENVIRONMENT: Lives with: lives with their spouse Lives in: House/apartment Stairs:  2 steps to enter; 2 story home Has following equipment at home: Single point cane  PLOF: Independent; retired   PATIENT GOALS: improve dizziness   OBJECTIVE:    TODAY'S TREATMENT: 10/16/2022 Activity Comments  Reviewed seated gaze stabilization Cues for smaller head ranges to maintain target, no dizziness  L DH Negative   R DH Positive upbeating torsional nystagmus, latent, lastsabout 10-15 seconds  R Epley Tolerates well  R DH Latent, positive upbeating torsional nystagmus, 20 seconds  R Epley Tolerates well  Brandt-Daroff performed x 3 reps to R side 1st trial 30 seconds nystagmus and symptoms; 2nd and 3rd trials, no nystagmus     Pt reports feeling lightheaded after completing Brandt-Daroff: Vitals 146/88 HR 56  PATIENT EDUCATION: Education details: HEP updates (try Brandt-Daroff to R side only) for habituation, continue with gaze stabilization, just smaller ranges horizontal to keep focus Person educated: Patient Education method: Explanation, Demonstration, Tactile cues, Verbal cues, and Handouts Education comprehension: verbalized understanding and returned demonstration         HOME EXERCISE PROGRAM Last updated: 10/14/22 Access Code: [email or web address] URL: [email or web address] Date: 10/14/2022 Prepared by: [facility] - Outpatient  Rehab - [facility]  Exercises - Brandt-Daroff Vestibular Exercise  - 1 x daily - 5 x weekly - 2 sets - 3-5 reps - Seated Gaze Stabilization with Head Rotation  - 1 x daily - 5 x weekly - 2-3 sets - 30 sec hold - Seated Gaze Stabilization with Head Nod  - 1 x daily - 5 x weekly - 2-3 sets - 30 sec hold       Below measures were taken at time of initial evaluation unless  otherwise specified:   DIAGNOSTIC FINDINGS: none recent  COGNITION: Overall cognitive status: Within functional limits for tasks assessed   SENSATION: Reports plantar R foot N/T for years  GAIT: Gait pattern: WFL Assistive device utilized: None Level of assistance: Complete Independence   PATIENT SURVEYS:  FOTO not performed  VESTIBULAR ASSESSMENT:  GENERAL OBSERVATION: pt has implanted contacts for distance and reading    OCULOMOTOR EXAM:  Ocular Alignment: normal  Ocular ROM: No Limitations  Spontaneous Nystagmus: absent  Gaze-Induced Nystagmus: absent  Smooth Pursuits: intact  Saccades: intact  Convergence/Divergence: 4 inches  VESTIBULAR - OCULAR REFLEX:   Slow VOR: Normal c/o "eyes delayed" coming back from R head turn; retinal slip evident   VOR Cancellation: Normal  Head-Impulse Test: HIT Right: overtly positive HIT Left: positive      POSITIONAL TESTING:  Right Roll Test: R upbeating torsional nystagmus ; Duration: 20 sec Left Roll Test: negative   Right Sidelying: R upbeating torsional nystagmus and dizziness sitting up; Duration: 60 sec Left Sidelying: L upbeating  torsional nystagmus; Duration: 40 sec  Right Dix-Hallpike: R upbeating torsional nystagmus; Duration:10 sec Left Dix-Hallpike: L upbeating torsional nystagmus ; Duration: 60 sec    VESTIBULAR TREATMENT:                                                                                                   DATE: 10/09/22  Canalith Repositioning:  Epley Right: Number of Reps: 1, Response to Treatment: symptoms improved, and Comment: tolerated well   PATIENT EDUCATION: Education details: prognosis, POC, edu on exam findings and tx course Person educated: Patient Education method: Medical illustrator Education comprehension: verbalized understanding  HOME EXERCISE PROGRAM:  GOALS: Goals reviewed with patient? Yes  SHORT TERM GOALS: Target date: 10/30/2022  Patient to be  independent with initial HEP. Baseline: HEP initiated Goal status: IN PROGRESS    LONG TERM GOALS: Target date: 11/20/2022  Patient to be independent with advanced HEP. Baseline: Not yet initiated  Goal status: IN PROGRESS  Patient to report 0/10 dizziness with standing vertical and horizontal VOR for 30 seconds. Baseline: Unable Goal status: IN PROGRESS  Patient will report 0/10 dizziness with bed mobility.  Baseline: Symptomatic  Goal status: IN PROGRESS  Patient to demonstrate mild-moderate sway with M-CTSIB condition with eyes closed/foam surface in order to improve safety in environments with uneven surfaces and dim lighting. Baseline: severe 10 sec 10/13/22 Goal status: IN PROGRESS 10/13/22  Patient to score at least 20/24 on DGI in order to decrease risk of falls. Baseline: 22/24 10/13/22 Goal status: MET 10/13/22    ASSESSMENT:  CLINICAL IMPRESSION: Skilled PT session today continued to address positional vertigo.  She continues to report improvement in symptoms, but occasional dizziness in spinning (especially with activity such as cleaning ceiling fans this week).  She also reports dizziness with R Brandt-Daroff that lessens slightly with repetition, no symptoms with to L.  With RDH performed today, she continues to have latent onset torsional nystagmus and tolerates Epley well x 2.  Performed Brant-Daroff x 3 reps, with symptoms only occurring 1st trial.  Educated on continueing HEP, trying just R side for Brandt-Daroff at home.  She is continueing to report symptom improvement and will continue to benefit from skilled PT towards goals for improved overall mobility and decreased dizziness.    OBJECTIVE IMPAIRMENTS: decreased activity tolerance, decreased balance, and dizziness.   ACTIVITY LIMITATIONS: carrying, lifting, bending, sitting, standing, squatting, sleeping, stairs, transfers, bed mobility, bathing, dressing, reach over head, and hygiene/grooming  PARTICIPATION  LIMITATIONS: meal prep, cleaning, laundry, driving, shopping, community activity, yard work, and church  PERSONAL FACTORS: Age, Past/current experiences, Time since onset of injury/illness/exacerbation, and 3+ comorbidities: Anemia, anxiety, breast CA s/p R mastectomy 07/31/22 and implant removal 09/09/22, depression, DM, GERD, HLD, HTN, migraine, palpitations, R ankle ORIF  are also affecting patient's functional outcome.   REHAB POTENTIAL: Good  CLINICAL DECISION MAKING: Evolving/moderate complexity  EVALUATION COMPLEXITY: Moderate   PLAN:  PT FREQUENCY: 1-2x/week  PT DURATION: 6 weeks  PLANNED INTERVENTIONS: Therapeutic exercises, Therapeutic activity, Neuromuscular re-education, Balance training, Gait training, Patient/Family education, Self Care, Joint mobilization, Stair training, Vestibular  training, Canalith repositioning, Dry Needling, Electrical stimulation, Cryotherapy, Moist heat, Taping, Manual therapy, and Re-evaluation  PLAN FOR NEXT SESSION: reassess R/L DH and treat as needed;review HEP and progress.  Work on compliant surfaces for balance, working towards goals.   Lonia Blood, PT 10/16/22 12:26 PM Phone: [phone] Fax: [phone]  [facility] at [facility] [address] [place], [address] [place], [place] [address] Phone # [phone] Fax # [phone]

## 2022-10-17 NOTE — Therapy (Signed)
OUTPATIENT PHYSICAL THERAPY VESTIBULAR TREATMENT     Patient Name: Meghan Mcdonald MRN: [id number] DOB:01-08-1956, 67 y.o., female Today's Date: 10/20/2022  END OF SESSION:  PT End of Session - 10/20/22 1144     Visit Number 3    Number of Visits 13    Date for PT Re-Evaluation 11/20/22    Authorization Type [employer]et[employer]    PT Start Time 1103    PT Stop Time 1143    PT Time Calculation (min) 40 min    Activity Tolerance Patient tolerated treatment well    Behavior During Therapy WFL for tasks assessed/performed               Past Medical History:  Diagnosis Date   Anemia    years ago   Anxiety    Arthritis    Breast cancer (HCC)    Chronic shoulder pain    COVID    x 2 both mild cases   Depression    Diabetes mellitus without complication (HCC)    GERD (gastroesophageal reflux disease)    History of kidney stones    Hyperlipemia    Hypertension    Insomnia    Lichen sclerosus of vulva    Migraine    Overweight    Rapid palpitations    Ventricular tachycardia (paroxysmal) (HCC)    Past Surgical History:  Procedure Laterality Date   BLADDER SURGERY     BREAST IMPLANT REMOVAL Right 09/19/2022   Procedure: REMOVAL RIGHT TISSUE EXPANDER;  Surgeon: Peggye Form, DO;  Location: [facility];  Service: Plastics;  Laterality: Right;   BREAST RECONSTRUCTION WITH PLACEMENT OF TISSUE EXPANDER AND FLEX HD (ACELLULAR HYDRATED DERMIS) Right 07/31/2022   Procedure: IMMEDIATE RIGHT BREAST RECONSTRUCTION WITH PLACEMENT OF TISSUE EXPANDER AND FLEX HD (ACELLULAR HYDRATED DERMIS);  Surgeon: Peggye Form, DO;  Location: [facility];  Service: Plastics;  Laterality: Right;   CARPAL TUNNEL RELEASE     CATARACT EXTRACTION Bilateral    CHOLECYSTECTOMY     MASTECTOMY W/ SENTINEL NODE BIOPSY Right 07/31/2022   Procedure: RIGHT MASTECTOMY WITH SENTINEL LYMPH NODE BIOPSY;  Surgeon: Griselda Miner, MD;  Location: [facility];  Service: General;  Laterality:  Right;   ORIF ANKLE FRACTURE Right 07/22/2018   Procedure: OPEN REDUCTION INTERNAL FIXATION (ORIF) RIGHT BIMALL ANKLE FRACTURE;  Surgeon: Terance Hart, MD;  Location: [facility];  Service: Orthopedics;  Laterality: Right;   ORIF TIBIA & FIBULA FRACTURES     TONSILLECTOMY     Patient Active Problem List   Diagnosis Date Noted   Malignant neoplasm of right breast in female, estrogen receptor positive (HCC) 08/11/2022   Breast cancer (HCC) 07/31/2022   S/P breast reconstruction 07/31/2022   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, estrogen receptor positive (HCC) 05/26/2022   Acute CVA (cerebrovascular accident) (HCC) 12/23/2017   Severe vertigo 12/22/2017   Vertigo 12/22/2017   Dyslipidemia 12/22/2017   Essential hypertension 12/22/2017   Nausea & vomiting 12/22/2017    PCP: Verl Blalock  REFERRING PROVIDER: Malachy Mood, MD  REFERRING DIAG: D05.11 (ICD-10-CM) - Ductal carcinoma in situ (DCIS) of right breast R42 (ICD-10-CM) - Dizziness  THERAPY DIAG:  Dizziness and giddiness  BPPV (benign paroxysmal positional vertigo), bilateral  ONSET DATE: April 2024  Rationale for Evaluation and Treatment: Rehabilitation  SUBJECTIVE:   SUBJECTIVE STATEMENT: Reports when she did her HEP yesterday, she did not have any dizziness. Reports still some dizziness with  rolling and looking up.   Pt accompanied by: self  PERTINENT HISTORY: Anemia, anxiety, breast CA s/p R mastectomy 07/31/22 and implant removal 09/09/22, depression, DM, GERD, HLD, HTN, migraine, palpitations, R ankle ORIF  PAIN:  Are you having pain? Yes: NPRS scale: 0/10 Pain location: R shoulder Pain description: sore Aggravating factors: stretching Relieving factors: -  PRECAUTIONS: recent surgeries, R UE lymphedema risk  WEIGHT BEARING RESTRICTIONS: No  FALLS: Has patient fallen in last 6 months? Yes. Number of falls reports frequent tripping and 1 fall into a  bush, "I'm just clumsy."  LIVING ENVIRONMENT: Lives with: lives with their spouse Lives in: House/apartment Stairs:  2 steps to enter; 2 story home Has following equipment at home: Single point cane  PLOF: Independent; retired   PATIENT GOALS: improve dizziness   OBJECTIVE:     TODAY'S TREATMENT: 10/20/22 Activity Comments  R sidelying test  Latent R upbeating torsional nystagmus ~ 1 min  R DH  + 20 sec vibration over R mastoid before and during testing; R upbeating torsional nystagmus ~ 30 sec  R Epley  Tolerated well  R DH Nystagmus difficult to determine lasting 30 sec; pt notes unchanged sx  R roll R upbeating torsional nystagmus ~ 30 sec  L roll L upbeating torsional nystagmus ~ 30 sec  Standing head nods to targets 2x30" C/o mild wooziness; c/o harder at quicker pace              PATIENT EDUCATION: Education details: review of HEP and edu on VOR and how it affects function; POC-agreed to drop to 1x/week to allow for habituation  Person educated: Patient Education method: Explanation, Demonstration, Tactile cues, Verbal cues, and Handouts Education comprehension: verbalized understanding and returned demonstration    HOME EXERCISE PROGRAM Access Code: [email or web address] URL: [email or web address] Date: 10/20/2022 Prepared by: [facility] - Outpatient  Rehab - [facility]  Exercises - Brandt-Daroff Vestibular Exercise  - 1 x daily - 5 x weekly - 2 sets - 3-5 reps - Seated Gaze Stabilization with Head Rotation  - 1 x daily - 5 x weekly - 2-3 sets - 30 sec hold - Seated Gaze Stabilization with Head Nod  - 1 x daily - 5 x weekly - 2-3 sets - 30 sec hold - Supine to Right Sidelying Vestibular Habituation  - 1 x daily - 5 x weekly - 2 sets - 3-5 reps - Supine to Left Sidelying Vestibular Habituation  - 1 x daily - 5 x weekly - 2 sets - 3-5 reps - Standing with Head Nod  - 1 x daily - 5 x weekly - 2-3 sets - 30 sec hold      Below measures were taken at  time of initial evaluation unless otherwise specified:   DIAGNOSTIC FINDINGS: none recent  COGNITION: Overall cognitive status: Within functional limits for tasks assessed   SENSATION: Reports plantar R foot N/T for years  GAIT: Gait pattern: WFL Assistive device utilized: None Level of assistance: Complete Independence   PATIENT SURVEYS:  FOTO not performed  VESTIBULAR ASSESSMENT:  GENERAL OBSERVATION: pt has implanted contacts for distance and reading    OCULOMOTOR EXAM:  Ocular Alignment: normal  Ocular ROM: No Limitations  Spontaneous Nystagmus: absent  Gaze-Induced Nystagmus: absent  Smooth Pursuits: intact  Saccades: intact  Convergence/Divergence: 4 inches  VESTIBULAR - OCULAR REFLEX:   Slow VOR: Normal c/o "eyes delayed" coming back from R head turn; retinal slip evident   VOR Cancellation: Normal  Head-Impulse Test: HIT Right:  overtly positive HIT Left: positive      POSITIONAL TESTING:  Right Roll Test: R upbeating torsional nystagmus ; Duration: 20 sec Left Roll Test: negative   Right Sidelying: R upbeating torsional nystagmus and dizziness sitting up; Duration: 60 sec Left Sidelying: L upbeating torsional nystagmus; Duration: 40 sec  Right Dix-Hallpike: R upbeating torsional nystagmus; Duration:10 sec Left Dix-Hallpike: L upbeating torsional nystagmus ; Duration: 60 sec    VESTIBULAR TREATMENT:                                                                                                   DATE: 10/09/22  Canalith Repositioning:  Epley Right: Number of Reps: 1, Response to Treatment: symptoms improved, and Comment: tolerated well   PATIENT EDUCATION: Education details: prognosis, POC, edu on exam findings and tx course Person educated: Patient Education method: Medical illustrator Education comprehension: verbalized understanding  HOME EXERCISE PROGRAM:  GOALS: Goals reviewed with patient? Yes  SHORT TERM GOALS: Target date:  10/30/2022  Patient to be independent with initial HEP. Baseline: HEP initiated Goal status: IN PROGRESS    LONG TERM GOALS: Target date: 11/20/2022  Patient to be independent with advanced HEP. Baseline: Not yet initiated  Goal status: IN PROGRESS  Patient to report 0/10 dizziness with standing vertical and horizontal VOR for 30 seconds. Baseline: Unable Goal status: IN PROGRESS  Patient will report 0/10 dizziness with bed mobility.  Baseline: Symptomatic  Goal status: IN PROGRESS  Patient to demonstrate mild-moderate sway with M-CTSIB condition with eyes closed/foam surface in order to improve safety in environments with uneven surfaces and dim lighting. Baseline: severe 10 sec 10/13/22 Goal status: IN PROGRESS 10/13/22  Patient to score at least 20/24 on DGI in order to decrease risk of falls. Baseline: 22/24 10/13/22 Goal status: MET 10/13/22    ASSESSMENT:  CLINICAL IMPRESSION: Patient arrived to session with report of improvement in dizziness with her HEP yesterday. Positional testing revealed remaining R posterior canalithiasis, treated with Epley x1 with vibration to assist in clearing BPPV. Patient tolerated this well but sx did not resolve. Reviewed and expanded HEP to include broader range of habituation activities. Patient tolerated session well and without complaints at end of session.     OBJECTIVE IMPAIRMENTS: decreased activity tolerance, decreased balance, and dizziness.   ACTIVITY LIMITATIONS: carrying, lifting, bending, sitting, standing, squatting, sleeping, stairs, transfers, bed mobility, bathing, dressing, reach over head, and hygiene/grooming  PARTICIPATION LIMITATIONS: meal prep, cleaning, laundry, driving, shopping, community activity, yard work, and church  PERSONAL FACTORS: Age, Past/current experiences, Time since onset of injury/illness/exacerbation, and 3+ comorbidities: Anemia, anxiety, breast CA s/p R mastectomy 07/31/22 and implant removal  09/09/22, depression, DM, GERD, HLD, HTN, migraine, palpitations, R ankle ORIF  are also affecting patient's functional outcome.   REHAB POTENTIAL: Good  CLINICAL DECISION MAKING: Evolving/moderate complexity  EVALUATION COMPLEXITY: Moderate   PLAN:  PT FREQUENCY: 1-2x/week  PT DURATION: 6 weeks  PLANNED INTERVENTIONS: Therapeutic exercises, Therapeutic activity, Neuromuscular re-education, Balance training, Gait training, Patient/Family education, Self Care, Joint mobilization, Stair training, Vestibular training, Canalith repositioning, Dry Needling, Electrical stimulation, Cryotherapy,  Moist heat, Taping, Manual therapy, and Re-evaluation  PLAN FOR NEXT SESSION: continue treating with habituation; Work on compliant surfaces for balance, working towards goals.    Anette Guarneri, PT, DPT 10/20/22 11:47 AM  [facility] at [facility] [address] [place], [address] [place], [place] [address] Phone # [phone] Fax # [phone]

## 2022-10-20 ENCOUNTER — Ambulatory Visit: Payer: 59 | Attending: Hematology | Admitting: Physical Therapy

## 2022-10-20 ENCOUNTER — Encounter: Payer: Self-pay | Admitting: Physical Therapy

## 2022-10-20 DIAGNOSIS — H8113 Benign paroxysmal vertigo, bilateral: Secondary | ICD-10-CM | POA: Insufficient documentation

## 2022-10-20 DIAGNOSIS — Z483 Aftercare following surgery for neoplasm: Secondary | ICD-10-CM | POA: Insufficient documentation

## 2022-10-20 DIAGNOSIS — R42 Dizziness and giddiness: Secondary | ICD-10-CM | POA: Insufficient documentation

## 2022-10-22 ENCOUNTER — Encounter: Payer: 59 | Admitting: Physical Therapy

## 2022-10-24 NOTE — Therapy (Signed)
OUTPATIENT PHYSICAL THERAPY VESTIBULAR TREATMENT     Patient Name: Meghan Mcdonald MRN: [id number] DOB:1955-04-27, 67 y.o., female Today's Date: 10/27/2022  END OF SESSION:  PT End of Session - 10/27/22 1614     Visit Number 5   # corrected from last session   Number of Visits 13    Date for PT Re-Evaluation 11/20/22    Authorization Type Aetna    PT Start Time 1531    PT Stop Time 1610    PT Time Calculation (min) 39 min    Equipment Utilized During Treatment Gait belt    Activity Tolerance Patient tolerated treatment well    Behavior During Therapy WFL for tasks assessed/performed                Past Medical History:  Diagnosis Date   Anemia    years ago   Anxiety    Arthritis    Breast cancer (HCC)    Chronic shoulder pain    COVID    x 2 both mild cases   Depression    Diabetes mellitus without complication (HCC)    GERD (gastroesophageal reflux disease)    History of kidney stones    Hyperlipemia    Hypertension    Insomnia    Lichen sclerosus of vulva    Migraine    Overweight    Rapid palpitations    Ventricular tachycardia (paroxysmal) (HCC)    Past Surgical History:  Procedure Laterality Date   BLADDER SURGERY     BREAST IMPLANT REMOVAL Right 09/19/2022   Procedure: REMOVAL RIGHT TISSUE EXPANDER;  Surgeon: Peggye Form, DO;  Location: [facility];  Service: Plastics;  Laterality: Right;   BREAST RECONSTRUCTION WITH PLACEMENT OF TISSUE EXPANDER AND FLEX HD (ACELLULAR HYDRATED DERMIS) Right 07/31/2022   Procedure: IMMEDIATE RIGHT BREAST RECONSTRUCTION WITH PLACEMENT OF TISSUE EXPANDER AND FLEX HD (ACELLULAR HYDRATED DERMIS);  Surgeon: Peggye Form, DO;  Location: [facility];  Service: Plastics;  Laterality: Right;   CARPAL TUNNEL RELEASE     CATARACT EXTRACTION Bilateral    CHOLECYSTECTOMY     MASTECTOMY W/ SENTINEL NODE BIOPSY Right 07/31/2022   Procedure: RIGHT MASTECTOMY WITH SENTINEL LYMPH NODE BIOPSY;  Surgeon: Griselda Miner, MD;  Location: [facility];  Service: General;  Laterality: Right;   ORIF ANKLE FRACTURE Right 07/22/2018   Procedure: OPEN REDUCTION INTERNAL FIXATION (ORIF) RIGHT BIMALL ANKLE FRACTURE;  Surgeon: Terance Hart, MD;  Location: [facility];  Service: Orthopedics;  Laterality: Right;   ORIF TIBIA & FIBULA FRACTURES     TONSILLECTOMY     Patient Active Problem List   Diagnosis Date Noted   Malignant neoplasm of right breast in female, estrogen receptor positive (HCC) 08/11/2022   Breast cancer (HCC) 07/31/2022   S/P breast reconstruction 07/31/2022   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, estrogen receptor positive (HCC) 05/26/2022   Acute CVA (cerebrovascular accident) (HCC) 12/23/2017   Severe vertigo 12/22/2017   Vertigo 12/22/2017   Dyslipidemia 12/22/2017   Essential hypertension 12/22/2017   Nausea & vomiting 12/22/2017    PCP: Verl Blalock  REFERRING PROVIDER: Malachy Mood, MD  REFERRING DIAG: D05.11 (ICD-10-CM) - Ductal carcinoma in situ (DCIS) of right breast R42 (ICD-10-CM) - Dizziness  THERAPY DIAG:  Dizziness and giddiness  BPPV (benign paroxysmal positional vertigo), bilateral  ONSET DATE: April 2024  Rationale for Evaluation and Treatment: Rehabilitation  SUBJECTIVE:   SUBJECTIVE STATEMENT: Still have  dizziness laying to the R side. Having dizziness when rolling onto back from either side and this brings on the most dizziness and nausea when sitting up after doing this.   Pt accompanied by: self  PERTINENT HISTORY: Anemia, anxiety, breast CA s/p R mastectomy 07/31/22 and implant removal 09/09/22, depression, DM, GERD, HLD, HTN, migraine, palpitations, R ankle ORIF  PAIN:  Are you having pain? Yes: NPRS scale: 0/10 Pain location: R shoulder Pain description: sore Aggravating factors: stretching Relieving factors: -  PRECAUTIONS: recent surgeries, R UE lymphedema risk  WEIGHT  BEARING RESTRICTIONS: No  FALLS: Has patient fallen in last 6 months? Yes. Number of falls reports frequent tripping and 1 fall into a bush, "I'm just clumsy."  LIVING ENVIRONMENT: Lives with: lives with their spouse Lives in: House/apartment Stairs:  2 steps to enter; 2 story home Has following equipment at home: Single point cane  PLOF: Independent; retired   PATIENT GOALS: improve dizziness   OBJECTIVE:    TODAY'S TREATMENT: 10/27/22 Activity Comments  L roll Negative; R upbeating torsional nystagmus upon rolling onto back  R roll R upbeating torsional nystagmus lasting ~25 sec, however still visible with R gaze   review HEP: R brandt daroff  L brandt daroff  sitting horizontal/vertical VOR 30" standing head nods 30" R upbeating torsional nystagmus lasting ~50 sec with R BD. No dizziness with VOR  Standing horizontal/vertical VOR 30" C/o sensation of sway; no dizziness   Rolling R/L with 2 pillows  Still with R upbeating torsional nystagmus upon R roll and rolling supine from L sidelying; pt reported mild dizziness   D2 flexion to cone on floor 5x each side  Tolerated well   1/2 turns to targets No dizziness; occasional mild imbalance          HOME EXERCISE PROGRAM Last updated: 10/27/22 Access Code: [email or web address] URL: [email or web address] Date: 10/27/2022 Prepared by: [facility] - Outpatient  Rehab - [facility]  Exercises - Brandt-Daroff Vestibular Exercise  - 1 x daily - 5 x weekly - 2 sets - 3-5 reps - Supine to Right Sidelying Vestibular Habituation  - 1 x daily - 5 x weekly - 2 sets - 3-5 reps - Supine to Left Sidelying Vestibular Habituation  - 1 x daily - 5 x weekly - 2 sets - 3-5 reps   PATIENT EDUCATION: Education details: edu on HEP change- modified rolling d/t intensity of sx  Person educated: Patient Education method: Explanation, Demonstration, Tactile cues, Verbal cues, and Handouts Education comprehension: verbalized understanding and  returned demonstration    HOME EXERCISE PROGRAM Access Code: [email or web address] URL: [email or web address] Date: 10/20/2022 Prepared by: [facility] - Outpatient  Rehab - [facility]  Exercises - Brandt-Daroff Vestibular Exercise  - 1 x daily - 5 x weekly - 2 sets - 3-5 reps - Seated Gaze Stabilization with Head Rotation  - 1 x daily - 5 x weekly - 2-3 sets - 30 sec hold - Seated Gaze Stabilization with Head Nod  - 1 x daily - 5 x weekly - 2-3 sets - 30 sec hold - Supine to Right Sidelying Vestibular Habituation  - 1 x daily - 5 x weekly - 2 sets - 3-5 reps - Supine to Left Sidelying Vestibular Habituation  - 1 x daily - 5 x weekly - 2 sets - 3-5 reps - Standing with Head Nod  - 1 x daily - 5 x weekly - 2-3 sets - 30 sec hold      Below measures were taken  at time of initial evaluation unless otherwise specified:   DIAGNOSTIC FINDINGS: none recent  COGNITION: Overall cognitive status: Within functional limits for tasks assessed   SENSATION: Reports plantar R foot N/T for years  GAIT: Gait pattern: WFL Assistive device utilized: None Level of assistance: Complete Independence   PATIENT SURVEYS:  FOTO not performed  VESTIBULAR ASSESSMENT:  GENERAL OBSERVATION: pt has implanted contacts for distance and reading    OCULOMOTOR EXAM:  Ocular Alignment: normal  Ocular ROM: No Limitations  Spontaneous Nystagmus: absent  Gaze-Induced Nystagmus: absent  Smooth Pursuits: intact  Saccades: intact  Convergence/Divergence: 4 inches  VESTIBULAR - OCULAR REFLEX:   Slow VOR: Normal c/o "eyes delayed" coming back from R head turn; retinal slip evident   VOR Cancellation: Normal  Head-Impulse Test: HIT Right: overtly positive HIT Left: positive      POSITIONAL TESTING:  Right Roll Test: R upbeating torsional nystagmus ; Duration: 20 sec Left Roll Test: negative   Right Sidelying: R upbeating torsional nystagmus and dizziness sitting up; Duration: 60 sec Left  Sidelying: L upbeating torsional nystagmus; Duration: 40 sec  Right Dix-Hallpike: R upbeating torsional nystagmus; Duration:10 sec Left Dix-Hallpike: L upbeating torsional nystagmus ; Duration: 60 sec    VESTIBULAR TREATMENT:                                                                                                   DATE: 10/09/22  Canalith Repositioning:  Epley Right: Number of Reps: 1, Response to Treatment: symptoms improved, and Comment: tolerated well   PATIENT EDUCATION: Education details: prognosis, POC, edu on exam findings and tx course Person educated: Patient Education method: Medical illustrator Education comprehension: verbalized understanding  HOME EXERCISE PROGRAM:  GOALS: Goals reviewed with patient? Yes  SHORT TERM GOALS: Target date: 10/30/2022  Patient to be independent with initial HEP. Baseline: HEP initiated Goal status: MET 10/27/22    LONG TERM GOALS: Target date: 11/20/2022  Patient to be independent with advanced HEP. Baseline: Not yet initiated  Goal status: IN PROGRESS  Patient to report 0/10 dizziness with standing vertical and horizontal VOR for 30 seconds. Baseline: Unable Goal status: IN PROGRESS  Patient will report 0/10 dizziness with bed mobility.  Baseline: Symptomatic  Goal status: IN PROGRESS  Patient to demonstrate mild-moderate sway with M-CTSIB condition with eyes closed/foam surface in order to improve safety in environments with uneven surfaces and dim lighting. Baseline: severe 10 sec 10/13/22 Goal status: IN PROGRESS 10/13/22  Patient to score at least 20/24 on DGI in order to decrease risk of falls. Baseline: 22/24 10/13/22 Goal status: MET 10/13/22    ASSESSMENT:  CLINICAL IMPRESSION: Patient arrived to session with report of dizziness and nausea when performing rolling activity at home. Reviewed HEP which revealed nystagmus indicative of R posterior BPPV with R roll and rolling supine from L side.  Remainder of HEP no longer brought on dizziness, thus this was removed from HEP. Modified rolling with additional pillow which was better-tolerated. Patient reported understanding of edu and without complaints upon leaving.     OBJECTIVE IMPAIRMENTS: decreased activity tolerance, decreased  balance, and dizziness.   ACTIVITY LIMITATIONS: carrying, lifting, bending, sitting, standing, squatting, sleeping, stairs, transfers, bed mobility, bathing, dressing, reach over head, and hygiene/grooming  PARTICIPATION LIMITATIONS: meal prep, cleaning, laundry, driving, shopping, community activity, yard work, and church  PERSONAL FACTORS: Age, Past/current experiences, Time since onset of injury/illness/exacerbation, and 3+ comorbidities: Anemia, anxiety, breast CA s/p R mastectomy 07/31/22 and implant removal 09/09/22, depression, DM, GERD, HLD, HTN, migraine, palpitations, R ankle ORIF  are also affecting patient's functional outcome.   REHAB POTENTIAL: Good  CLINICAL DECISION MAKING: Evolving/moderate complexity  EVALUATION COMPLEXITY: Moderate   PLAN:  PT FREQUENCY: 1-2x/week  PT DURATION: 6 weeks  PLANNED INTERVENTIONS: Therapeutic exercises, Therapeutic activity, Neuromuscular re-education, Balance training, Gait training, Patient/Family education, Self Care, Joint mobilization, Stair training, Vestibular training, Canalith repositioning, Dry Needling, Electrical stimulation, Cryotherapy, Moist heat, Taping, Manual therapy, and Re-evaluation  PLAN FOR NEXT SESSION: continue treating with habituation; Work on compliant surfaces for balance, working towards goals.    Anette Guarneri, PT, DPT 10/27/22 4:15 PM   Outpatient Rehab at [facility] [address] [place], [address] [place], [place] [address] Phone # [phone] Fax # [phone]

## 2022-10-27 ENCOUNTER — Ambulatory Visit: Payer: 59

## 2022-10-27 ENCOUNTER — Ambulatory Visit: Payer: 59 | Admitting: Physical Therapy

## 2022-10-27 ENCOUNTER — Encounter: Payer: Self-pay | Admitting: Physical Therapy

## 2022-10-27 VITALS — Wt 142.1 lb

## 2022-10-27 DIAGNOSIS — Z483 Aftercare following surgery for neoplasm: Secondary | ICD-10-CM

## 2022-10-27 DIAGNOSIS — R42 Dizziness and giddiness: Secondary | ICD-10-CM | POA: Diagnosis not present

## 2022-10-27 DIAGNOSIS — H8113 Benign paroxysmal vertigo, bilateral: Secondary | ICD-10-CM | POA: Diagnosis not present

## 2022-10-27 NOTE — Therapy (Signed)
OUTPATIENT PHYSICAL THERAPY SOZO SCREENING NOTE   Patient Name: Meghan Mcdonald MRN: [id number] DOB:10-24-1955, 67 y.o., female Today's Date: 10/27/2022  PCP: Verl Blalock REFERRING PROVIDER: Malachy Mood, MD   PT End of Session - 10/27/22 1516     Visit Number 3   # unchanged due to screen only   PT Start Time 1513    PT Stop Time 1517    PT Time Calculation (min) 4 min    Activity Tolerance Patient tolerated treatment well    Behavior During Therapy WFL for tasks assessed/performed             Past Medical History:  Diagnosis Date   Anemia    years ago   Anxiety    Arthritis    Breast cancer (HCC)    Chronic shoulder pain    COVID    x 2 both mild cases   Depression    Diabetes mellitus without complication (HCC)    GERD (gastroesophageal reflux disease)    History of kidney stones    Hyperlipemia    Hypertension    Insomnia    Lichen sclerosus of vulva    Migraine    Overweight    Rapid palpitations    Ventricular tachycardia (paroxysmal) (HCC)    Past Surgical History:  Procedure Laterality Date   BLADDER SURGERY     BREAST IMPLANT REMOVAL Right 09/19/2022   Procedure: REMOVAL RIGHT TISSUE EXPANDER;  Surgeon: Peggye Form, DO;  Location: [facility];  Service: Plastics;  Laterality: Right;   BREAST RECONSTRUCTION WITH PLACEMENT OF TISSUE EXPANDER AND FLEX HD (ACELLULAR HYDRATED DERMIS) Right 07/31/2022   Procedure: IMMEDIATE RIGHT BREAST RECONSTRUCTION WITH PLACEMENT OF TISSUE EXPANDER AND FLEX HD (ACELLULAR HYDRATED DERMIS);  Surgeon: Peggye Form, DO;  Location: [facility];  Service: Plastics;  Laterality: Right;   CARPAL TUNNEL RELEASE     CATARACT EXTRACTION Bilateral    CHOLECYSTECTOMY     MASTECTOMY W/ SENTINEL NODE BIOPSY Right 07/31/2022   Procedure: RIGHT MASTECTOMY WITH SENTINEL LYMPH NODE BIOPSY;  Surgeon: Griselda Miner, MD;  Location: [facility];  Service: General;  Laterality: Right;   ORIF ANKLE  FRACTURE Right 07/22/2018   Procedure: OPEN REDUCTION INTERNAL FIXATION (ORIF) RIGHT BIMALL ANKLE FRACTURE;  Surgeon: Terance Hart, MD;  Location: [facility];  Service: Orthopedics;  Laterality: Right;   ORIF TIBIA & FIBULA FRACTURES     TONSILLECTOMY     Patient Active Problem List   Diagnosis Date Noted   Malignant neoplasm of right breast in female, estrogen receptor positive (HCC) 08/11/2022   Breast cancer (HCC) 07/31/2022   S/P breast reconstruction 07/31/2022   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 She had a total of 3 biopsies: 1 on 05/14/22: ER+ PR+ DCIS, 05/21/22: DCIS 2/5 mm ER/PR+, 06/11/22: DCIS grade 2, ER+, PR- Ki67: 20%, Hx of diabetes, hypertension. 07/31/22- R mastectomy SLNB 0/2. Had placement of expanders   PRECAUTIONS: right UE Lymphedema risk, None  SUBJECTIVE: Pt returns for her first 3 month L-Dex screen.  PAIN:  Are you having pain? No  SOZO SCREENING: Patient was assessed today using the SOZO machine to determine the lymphedema index score. This was compared to her baseline score. It was determined that she is within the recommended range when compared to her baseline and no further action is needed at this time. She will continue SOZO screenings. These are done every 3 months for 2 years post operatively followed by every 6 months for 2 years, and then annually.   L-DEX FLOWSHEETS - 10/27/22 1500       L-DEX LYMPHEDEMA SCREENING   Measurement Type Unilateral    L-DEX MEASUREMENT  EXTREMITY Upper Extremity    POSITION  Standing    DOMINANT SIDE Right    At Risk Side Right    BASELINE SCORE (UNILATERAL) -4    L-DEX SCORE (UNILATERAL) -7.4    VALUE CHANGE (UNILAT) -3.4               Hermenia Bers, PTA 10/27/2022, 3:17 PM

## 2022-10-30 ENCOUNTER — Encounter: Payer: 59 | Admitting: Physical Therapy

## 2022-11-03 ENCOUNTER — Ambulatory Visit: Payer: 59 | Admitting: Physical Therapy

## 2022-11-03 ENCOUNTER — Encounter: Payer: Self-pay | Admitting: Physical Therapy

## 2022-11-03 DIAGNOSIS — R42 Dizziness and giddiness: Secondary | ICD-10-CM | POA: Diagnosis not present

## 2022-11-03 DIAGNOSIS — Z483 Aftercare following surgery for neoplasm: Secondary | ICD-10-CM | POA: Diagnosis not present

## 2022-11-03 DIAGNOSIS — H8113 Benign paroxysmal vertigo, bilateral: Secondary | ICD-10-CM | POA: Diagnosis not present

## 2022-11-03 NOTE — Therapy (Signed)
OUTPATIENT PHYSICAL THERAPY VESTIBULAR TREATMENT     Patient Name: Meghan Mcdonald MRN: [id number] DOB:1955-09-14, 67 y.o., female Today's Date: 11/03/2022  END OF SESSION:  PT End of Session - 11/03/22 1618     Visit Number 6    Number of Visits 13    Date for PT Re-Evaluation 11/20/22    Authorization Type [employer]    PT Start Time 1533    PT Stop Time 1616    PT Time Calculation (min) 43 min    Activity Tolerance Patient tolerated treatment well    Behavior During Therapy WFL for tasks assessed/performed                 Past Medical History:  Diagnosis Date   Anemia    years ago   Anxiety    Arthritis    Breast cancer (HCC)    Chronic shoulder pain    COVID    x 2 both mild cases   Depression    Diabetes mellitus without complication (HCC)    GERD (gastroesophageal reflux disease)    History of kidney stones    Hyperlipemia    Hypertension    Insomnia    Lichen sclerosus of vulva    Migraine    Overweight    Rapid palpitations    Ventricular tachycardia (paroxysmal) (HCC)    Past Surgical History:  Procedure Laterality Date   BLADDER SURGERY     BREAST IMPLANT REMOVAL Right 09/19/2022   Procedure: REMOVAL RIGHT TISSUE EXPANDER;  Surgeon: Peggye Form, DO;  Location: [facility];  Service: Plastics;  Laterality: Right;   BREAST RECONSTRUCTION WITH PLACEMENT OF TISSUE EXPANDER AND FLEX HD (ACELLULAR HYDRATED DERMIS) Right 07/31/2022   Procedure: IMMEDIATE RIGHT BREAST RECONSTRUCTION WITH PLACEMENT OF TISSUE EXPANDER AND FLEX HD (ACELLULAR HYDRATED DERMIS);  Surgeon: Peggye Form, DO;  Location: [facility];  Service: Plastics;  Laterality: Right;   CARPAL TUNNEL RELEASE     CATARACT EXTRACTION Bilateral    CHOLECYSTECTOMY     MASTECTOMY W/ SENTINEL NODE BIOPSY Right 07/31/2022   Procedure: RIGHT MASTECTOMY WITH SENTINEL LYMPH NODE BIOPSY;  Surgeon: Griselda Miner, MD;  Location: [facility];  Service: General;   Laterality: Right;   ORIF ANKLE FRACTURE Right 07/22/2018   Procedure: OPEN REDUCTION INTERNAL FIXATION (ORIF) RIGHT BIMALL ANKLE FRACTURE;  Surgeon: Terance Hart, MD;  Location: [facility];  Service: Orthopedics;  Laterality: Right;   ORIF TIBIA & FIBULA FRACTURES     TONSILLECTOMY     Patient Active Problem List   Diagnosis Date Noted   Malignant neoplasm of right breast in female, estrogen receptor positive (HCC) 08/11/2022   Breast cancer (HCC) 07/31/2022   S/P breast reconstruction 07/31/2022   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, estrogen receptor positive (HCC) 05/26/2022   Acute CVA (cerebrovascular accident) (HCC) 12/23/2017   Severe vertigo 12/22/2017   Vertigo 12/22/2017   Dyslipidemia 12/22/2017   Essential hypertension 12/22/2017   Nausea & vomiting 12/22/2017    PCP: Verl Blalock  REFERRING PROVIDER: Malachy Mood, MD  REFERRING DIAG: D05.11 (ICD-10-CM) - Ductal carcinoma in situ (DCIS) of right breast R42 (ICD-10-CM) - Dizziness  THERAPY DIAG:  Dizziness and giddiness  BPPV (benign paroxysmal positional vertigo), bilateral  ONSET DATE: April 2024  Rationale for Evaluation and Treatment: Rehabilitation  SUBJECTIVE:   SUBJECTIVE STATEMENT: Reports that she is about the same. Trialed rolling at home with 2 pillows which was  less symptomatic but going back to 1 pillow brought on more symptoms. Rolling is no longer making her up however when looking overhead to look at the trellis in the garden she has to hold on d/t dizziness.   Pt accompanied by: self  PERTINENT HISTORY: Anemia, anxiety, breast CA s/p R mastectomy 07/31/22 and implant removal 09/09/22, depression, DM, GERD, HLD, HTN, migraine, palpitations, R ankle ORIF  PAIN:  Are you having pain? Yes: NPRS scale: 0/10 Pain location: R shoulder Pain description: sore Aggravating factors: stretching Relieving factors: -  PRECAUTIONS: recent  surgeries, R UE lymphedema risk  WEIGHT BEARING RESTRICTIONS: No  FALLS: Has patient fallen in last 6 months? Yes. Number of falls reports frequent tripping and 1 fall into a bush, "I'm just clumsy."  LIVING ENVIRONMENT: Lives with: lives with their spouse Lives in: House/apartment Stairs:  2 steps to enter; 2 story home Has following equipment at home: Single point cane  PLOF: Independent; retired   PATIENT GOALS: improve dizziness   OBJECTIVE:     TODAY'S TREATMENT: 11/03/22 Activity Comments  R sidelying test R upbeating torsional nystagmus lasting ~ 1 min  Ganz maneuver Starting in [employer] position; holding each position 1 min; pt tolerated well  R sidelying test R upbeating torsional nystagmus lasting ~ 1 min  R BBQ roll  Tolerated well; R upbeating torsional nystagmus with R roll and L upbeating torsional nystagmus with L roll   R sidelying test  Less intense dizziness and nystagmus   R BBQ roll  No nystagmus with R roll; tolerated well; L upbeating torsional nystagmus with L roll        PATIENT EDUCATION: Education details: answered pt's questions on rationale for CRM performed today as well as on migraines and motion sickness  Person educated: Patient Education method: Explanation Education comprehension: verbalized understanding    HOME EXERCISE PROGRAM Last updated: 10/27/22 Access Code: [email or web address] URL: [email or web address] Date: 10/27/2022 Prepared by: [facility] - [facility]  Exercises - Brandt-Daroff Vestibular Exercise  - 1 x daily - 5 x weekly - 2 sets - 3-5 reps - Supine to Right Sidelying Vestibular Habituation  - 1 x daily - 5 x weekly - 2 sets - 3-5 reps - Supine to Left Sidelying Vestibular Habituation  - 1 x daily - 5 x weekly - 2 sets - 3-5 reps    Below measures were taken at time of initial evaluation unless otherwise specified:   DIAGNOSTIC FINDINGS: none recent  COGNITION: Overall cognitive  status: Within functional limits for tasks assessed   SENSATION: Reports plantar R foot N/T for years  GAIT: Gait pattern: WFL Assistive device utilized: None Level of assistance: Complete Independence   PATIENT SURVEYS:  FOTO not performed  VESTIBULAR ASSESSMENT:  GENERAL OBSERVATION: pt has implanted contacts for distance and reading    OCULOMOTOR EXAM:  Ocular Alignment: normal  Ocular ROM: No Limitations  Spontaneous Nystagmus: absent  Gaze-Induced Nystagmus: absent  Smooth Pursuits: intact  Saccades: intact  Convergence/Divergence: 4 inches  VESTIBULAR - OCULAR REFLEX:   Slow VOR: Normal c/o "eyes delayed" coming back from R head turn; retinal slip evident   VOR Cancellation: Normal  Head-Impulse Test: HIT Right: overtly positive HIT Left: positive      POSITIONAL TESTING:  Right Roll Test: R upbeating torsional nystagmus ; Duration: 20 sec Left Roll Test: negative   Right Sidelying: R upbeating torsional nystagmus and dizziness sitting up; Duration: 60 sec Left Sidelying: L upbeating torsional  nystagmus; Duration: 40 sec  Right Dix-Hallpike: R upbeating torsional nystagmus; Duration:10 sec Left Dix-Hallpike: L upbeating torsional nystagmus ; Duration: 60 sec    VESTIBULAR TREATMENT:                                                                                                   DATE: 10/09/22  Canalith Repositioning:  Epley Right: Number of Reps: 1, Response to Treatment: symptoms improved, and Comment: tolerated well   PATIENT EDUCATION: Education details: prognosis, POC, edu on exam findings and tx course Person educated: Patient Education method: Medical illustrator Education comprehension: verbalized understanding  HOME EXERCISE PROGRAM:  GOALS: Goals reviewed with patient? Yes  SHORT TERM GOALS: Target date: 10/30/2022  Patient to be independent with initial HEP. Baseline: HEP initiated Goal status: MET 10/27/22    LONG TERM  GOALS: Target date: 11/20/2022  Patient to be independent with advanced HEP. Baseline: Not yet initiated  Goal status: IN PROGRESS  Patient to report 0/10 dizziness with standing vertical and horizontal VOR for 30 seconds. Baseline: Unable Goal status: IN PROGRESS  Patient will report 0/10 dizziness with bed mobility.  Baseline: Symptomatic  Goal status: IN PROGRESS  Patient to demonstrate mild-moderate sway with M-CTSIB condition with eyes closed/foam surface in order to improve safety in environments with uneven surfaces and dim lighting. Baseline: severe 10 sec 10/13/22 Goal status: IN PROGRESS 10/13/22  Patient to score at least 20/24 on DGI in order to decrease risk of falls. Baseline: 22/24 10/13/22 Goal status: MET 10/13/22    ASSESSMENT:  CLINICAL IMPRESSION:  Patient arrived to session with report of decreased symptoms with rolling but still reports dizziness with looking overhead. Patient still with persisting R posterior canalithiasis. Trialed Ganz maneuver without resolution. However with R BBQ roll, patient demonstrated improved dizziness and nystagmus, thus this was performed 2x. Patient reported no complaints upon leaving.     OBJECTIVE IMPAIRMENTS: decreased activity tolerance, decreased balance, and dizziness.   ACTIVITY LIMITATIONS: carrying, lifting, bending, sitting, standing, squatting, sleeping, stairs, transfers, bed mobility, bathing, dressing, reach over head, and hygiene/grooming  PARTICIPATION LIMITATIONS: meal prep, cleaning, laundry, driving, shopping, community activity, yard work, and church  PERSONAL FACTORS: Age, Past/current experiences, Time since onset of injury/illness/exacerbation, and 3+ comorbidities: Anemia, anxiety, breast CA s/p R mastectomy 07/31/22 and implant removal 09/09/22, depression, DM, GERD, HLD, HTN, migraine, palpitations, R ankle ORIF  are also affecting patient's functional outcome.   REHAB POTENTIAL: Good  CLINICAL DECISION  MAKING: Evolving/moderate complexity  EVALUATION COMPLEXITY: Moderate   PLAN:  PT FREQUENCY: 1-2x/week  PT DURATION: 6 weeks  PLANNED INTERVENTIONS: Therapeutic exercises, Therapeutic activity, Neuromuscular re-education, Balance training, Gait training, Patient/Family education, Self Care, Joint mobilization, Stair training, Vestibular training, Canalith repositioning, Dry Needling, Electrical stimulation, Cryotherapy, Moist heat, Taping, Manual therapy, and Re-evaluation  PLAN FOR NEXT SESSION: continue treating with habituation; Work on compliant surfaces for balance, working towards goals.    Anette Guarneri, PT, DPT 11/03/22 4:20 PM  [facility] [address] [place], [address] [place], [place] [address] Phone # [phone] Fax # [phone]  

## 2022-11-06 ENCOUNTER — Encounter: Payer: 59 | Admitting: Physical Therapy

## 2022-11-07 NOTE — Therapy (Signed)
OUTPATIENT PHYSICAL THERAPY VESTIBULAR TREATMENT     Patient Name: Meghan Mcdonald MRN: [id number] DOB:01-30-1956, 67 y.o., female Today's Date: 11/10/2022  END OF SESSION:  PT End of Session - 11/10/22 1613     Visit Number 7    Number of Visits 13    Date for PT Re-Evaluation 11/20/22    Authorization Type [employer]et[employer]    PT Start Time 1536    PT Stop Time 1612    PT Time Calculation (min) 36 min    Equipment Utilized During Treatment Gait belt    Activity Tolerance Patient tolerated treatment well    Behavior During Therapy WFL for tasks assessed/performed                  Past Medical History:  Diagnosis Date   Anemia    years ago   Anxiety    Arthritis    Breast cancer (HCC)    Chronic shoulder pain    COVID    x 2 both mild cases   Depression    Diabetes mellitus without complication (HCC)    GERD (gastroesophageal reflux disease)    History of kidney stones    Hyperlipemia    Hypertension    Insomnia    Lichen sclerosus of vulva    Migraine    Overweight    Rapid palpitations    Ventricular tachycardia (paroxysmal) (HCC)    Past Surgical History:  Procedure Laterality Date   BLADDER SURGERY     BREAST IMPLANT REMOVAL Right 09/19/2022   Procedure: REMOVAL RIGHT TISSUE EXPANDER;  Surgeon: Peggye Form, DO;  Location: [facility];  Service: Plastics;  Laterality: Right;   BREAST RECONSTRUCTION WITH PLACEMENT OF TISSUE EXPANDER AND FLEX HD (ACELLULAR HYDRATED DERMIS) Right 07/31/2022   Procedure: IMMEDIATE RIGHT BREAST RECONSTRUCTION WITH PLACEMENT OF TISSUE EXPANDER AND FLEX HD (ACELLULAR HYDRATED DERMIS);  Surgeon: Peggye Form, DO;  Location: [facility];  Service: Plastics;  Laterality: Right;   CARPAL TUNNEL RELEASE     CATARACT EXTRACTION Bilateral    CHOLECYSTECTOMY     MASTECTOMY W/ SENTINEL NODE BIOPSY Right 07/31/2022   Procedure: RIGHT MASTECTOMY WITH SENTINEL LYMPH NODE BIOPSY;  Surgeon: Griselda Miner, MD;  Location: [facility];  Service: General;  Laterality: Right;   ORIF ANKLE FRACTURE Right 07/22/2018   Procedure: OPEN REDUCTION INTERNAL FIXATION (ORIF) RIGHT BIMALL ANKLE FRACTURE;  Surgeon: Terance Hart, MD;  Location: [facility];  Service: Orthopedics;  Laterality: Right;   ORIF TIBIA & FIBULA FRACTURES     TONSILLECTOMY     Patient Active Problem List   Diagnosis Date Noted   Malignant neoplasm of right breast in female, estrogen receptor positive (HCC) 08/11/2022   Breast cancer (HCC) 07/31/2022   S/P breast reconstruction 07/31/2022   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, estrogen receptor positive (HCC) 05/26/2022   Acute CVA (cerebrovascular accident) (HCC) 12/23/2017   Severe vertigo 12/22/2017   Vertigo 12/22/2017   Dyslipidemia 12/22/2017   Essential hypertension 12/22/2017   Nausea & vomiting 12/22/2017    PCP: Verl Blalock  REFERRING PROVIDER: Malachy Mood, MD  REFERRING DIAG: D05.11 (ICD-10-CM) - Ductal carcinoma in situ (DCIS) of right breast R42 (ICD-10-CM) - Dizziness  THERAPY DIAG:  Dizziness and giddiness  BPPV (benign paroxysmal positional vertigo), bilateral  ONSET DATE: April 2024  Rationale for Evaluation and Treatment: Rehabilitation  SUBJECTIVE:   SUBJECTIVE STATEMENT: Symptoms during the day are very  mild. Reports that Tuesday after BBQ roll she woke up that AM with a severe episode of vertigo, had to sit on the side of the bed after a full minute and after that she felt that she was listing. However, reports that yesterday she did her exercises without symptoms. Today, she had some mild symptoms.   Pt accompanied by: self  PERTINENT HISTORY: Anemia, anxiety, breast CA s/p R mastectomy 07/31/22 and implant removal 09/09/22, depression, DM, GERD, HLD, HTN, migraine, palpitations, R ankle ORIF  PAIN:  Are you having pain? Yes: NPRS scale: 0/10 Pain location: R shoulder Pain description:  sore Aggravating factors: stretching Relieving factors: -  PRECAUTIONS: recent surgeries, R UE lymphedema risk  WEIGHT BEARING RESTRICTIONS: No  FALLS: Has patient fallen in last 6 months? Yes. Number of falls reports frequent tripping and 1 fall into a bush, "I'm just clumsy."  LIVING ENVIRONMENT: Lives with: lives with their spouse Lives in: House/apartment Stairs:  2 steps to enter; 2 story home Has following equipment at home: Single point cane  PLOF: Independent; retired   PATIENT GOALS: improve dizziness   OBJECTIVE:    TODAY'S TREATMENT: 11/10/22 Activity Comments  R sidelying test Large amplitude R upbeating torsional nystagmus   R roll Small amplitude R upbeating torsional nystagmus lasting 25 sec; no sx   L roll  negative  D2 flexion to cone on floor 10x each side No dizziness, even at quick speed  gait + head nods/turns, then diagonals  C/o some dizziness with cervical extension, imbalance with L diagonals   1/2 turns to targets, then with alt toe tap on cone  Some imbalance with L turn           PATIENT EDUCATION: Education details: edu on patient's symptoms and possible causes of vertigo including migrainous vertigo vs. BPPV; POC, HEP update Person educated: Patient Education method: Explanation, Demonstration, Tactile cues, Verbal cues, and Handouts Education comprehension: verbalized understanding and returned demonstration   HOME EXERCISE PROGRAM Access Code: [email or web address] URL: [email or web address] Date: 11/10/2022 Prepared by: [facility] - Outpatient  Rehab - [facility]  Exercises - Brandt-Daroff Vestibular Exercise  - 1 x daily - 5 x weekly - 2 sets - 3-5 reps - Supine to Right Sidelying Vestibular Habituation  - 1 x daily - 5 x weekly - 2 sets - 3-5 reps - Supine to Left Sidelying Vestibular Habituation  - 1 x daily - 5 x weekly - 2 sets - 3-5 reps - Forward Walking with Diagonal Head Turns  - 1 x daily - 5 x weekly - 2 sets - 5  reps - Gait with Head Nods and Turns on Grass  - 1 x daily - 5 x weekly - 2 sets - 5 reps    Below measures were taken at time of initial evaluation unless otherwise specified:   DIAGNOSTIC FINDINGS: none recent  COGNITION: Overall cognitive status: Within functional limits for tasks assessed   SENSATION: Reports plantar R foot N/T for years  GAIT: Gait pattern: WFL Assistive device utilized: None Level of assistance: Complete Independence   PATIENT SURVEYS:  FOTO not performed  VESTIBULAR ASSESSMENT:  GENERAL OBSERVATION: pt has implanted contacts for distance and reading    OCULOMOTOR EXAM:  Ocular Alignment: normal  Ocular ROM: No Limitations  Spontaneous Nystagmus: absent  Gaze-Induced Nystagmus: absent  Smooth Pursuits: intact  Saccades: intact  Convergence/Divergence: 4 inches  VESTIBULAR - OCULAR REFLEX:   Slow VOR: Normal c/o "eyes delayed" coming back from R head turn;  retinal slip evident   VOR Cancellation: Normal  Head-Impulse Test: HIT Right: overtly positive HIT Left: positive      POSITIONAL TESTING:  Right Roll Test: R upbeating torsional nystagmus ; Duration: 20 sec Left Roll Test: negative   Right Sidelying: R upbeating torsional nystagmus and dizziness sitting up; Duration: 60 sec Left Sidelying: L upbeating torsional nystagmus; Duration: 40 sec  Right Dix-Hallpike: R upbeating torsional nystagmus; Duration:10 sec Left Dix-Hallpike: L upbeating torsional nystagmus ; Duration: 60 sec    VESTIBULAR TREATMENT:                                                                                                   DATE: 10/09/22  Canalith Repositioning:  Epley Right: Number of Reps: 1, Response to Treatment: symptoms improved, and Comment: tolerated well   PATIENT EDUCATION: Education details: prognosis, POC, edu on exam findings and tx course Person educated: Patient Education method: Medical illustrator Education comprehension:  verbalized understanding  HOME EXERCISE PROGRAM:  GOALS: Goals reviewed with patient? Yes  SHORT TERM GOALS: Target date: 10/30/2022  Patient to be independent with initial HEP. Baseline: HEP initiated Goal status: MET 10/27/22    LONG TERM GOALS: Target date: 11/20/2022  Patient to be independent with advanced HEP. Baseline: Not yet initiated  Goal status: IN PROGRESS  Patient to report 0/10 dizziness with standing vertical and horizontal VOR for 30 seconds. Baseline: Unable Goal status: IN PROGRESS  Patient will report 0/10 dizziness with bed mobility.  Baseline: Symptomatic  Goal status: IN PROGRESS  Patient to demonstrate mild-moderate sway with M-CTSIB condition with eyes closed/foam surface in order to improve safety in environments with uneven surfaces and dim lighting. Baseline: severe 10 sec 10/13/22 Goal status: IN PROGRESS 10/13/22  Patient to score at least 20/24 on DGI in order to decrease risk of falls. Baseline: 22/24 10/13/22 Goal status: MET 10/13/22    ASSESSMENT:  CLINICAL IMPRESSION: Patient arrived to session with report of episode of dizziness the morning after last session. However, notes that yesterday she did her exercises without symptoms; noted mild symptoms today. Positional testing revealed remaining R upbeating torsional nystagmus with R ear testing. Discussed possibility of vestibular migraine as cause of symptoms rather than BPPV, however patient apprehensive about seeking care from neurologist. Habituation activities brought on mild dizziness with cervical extension and L head turns. Patient reported understanding of HEP and without complaints upon leaving.     OBJECTIVE IMPAIRMENTS: decreased activity tolerance, decreased balance, and dizziness.   ACTIVITY LIMITATIONS: carrying, lifting, bending, sitting, standing, squatting, sleeping, stairs, transfers, bed mobility, bathing, dressing, reach over head, and hygiene/grooming  PARTICIPATION  LIMITATIONS: meal prep, cleaning, laundry, driving, shopping, community activity, yard work, and church  PERSONAL FACTORS: Age, Past/current experiences, Time since onset of injury/illness/exacerbation, and 3+ comorbidities: Anemia, anxiety, breast CA s/p R mastectomy 07/31/22 and implant removal 09/09/22, depression, DM, GERD, HLD, HTN, migraine, palpitations, R ankle ORIF  are also affecting patient's functional outcome.   REHAB POTENTIAL: Good  CLINICAL DECISION MAKING: Evolving/moderate complexity  EVALUATION COMPLEXITY: Moderate   PLAN:  PT FREQUENCY: 1-2x/week  PT DURATION: 6 weeks  PLANNED INTERVENTIONS: Therapeutic exercises, Therapeutic activity, Neuromuscular re-education, Balance training, Gait training, Patient/Family education, Self Care, Joint mobilization, Stair training, Vestibular training, Canalith repositioning, Dry Needling, Electrical stimulation, Cryotherapy, Moist heat, Taping, Manual therapy, and Re-evaluation  PLAN FOR NEXT SESSION: 30 day hold; continue treating with habituation; Work on compliant surfaces for balance, working towards goals.   Anette Guarneri, PT, DPT 11/10/22 4:16 PM  [place] at [facility] [address] [place], [address] [place], [place] [address] Phone # [phone] Fax # [phone]

## 2022-11-10 ENCOUNTER — Encounter: Payer: Self-pay | Admitting: Physical Therapy

## 2022-11-10 ENCOUNTER — Ambulatory Visit: Payer: 59 | Admitting: Physical Therapy

## 2022-11-10 DIAGNOSIS — H8113 Benign paroxysmal vertigo, bilateral: Secondary | ICD-10-CM | POA: Diagnosis not present

## 2022-11-10 DIAGNOSIS — R42 Dizziness and giddiness: Secondary | ICD-10-CM

## 2022-11-10 DIAGNOSIS — Z483 Aftercare following surgery for neoplasm: Secondary | ICD-10-CM | POA: Diagnosis not present

## 2022-11-13 ENCOUNTER — Ambulatory Visit: Payer: 59 | Admitting: Physical Therapy
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
## 2022-11-17 ENCOUNTER — Ambulatory Visit: Payer: 59 | Admitting: Physical Therapy

## 2022-11-17 ENCOUNTER — Encounter: Payer: Self-pay | Admitting: Physical Therapy

## 2022-11-17 DIAGNOSIS — R42 Dizziness and giddiness: Secondary | ICD-10-CM | POA: Diagnosis not present

## 2022-11-17 DIAGNOSIS — Z483 Aftercare following surgery for neoplasm: Secondary | ICD-10-CM | POA: Diagnosis not present

## 2022-11-17 DIAGNOSIS — H8113 Benign paroxysmal vertigo, bilateral: Secondary | ICD-10-CM | POA: Diagnosis not present

## 2022-12-08 ENCOUNTER — Other Ambulatory Visit: Payer: Self-pay | Admitting: Hematology

## 2022-12-12 ENCOUNTER — Encounter: Payer: 59 | Admitting: Plastic Surgery

## 2022-12-16 ENCOUNTER — Encounter: Payer: Self-pay | Admitting: Adult Health

## 2022-12-16 ENCOUNTER — Inpatient Hospital Stay: Payer: 59 | Attending: Hematology | Admitting: Adult Health

## 2022-12-16 ENCOUNTER — Encounter: Payer: Self-pay | Admitting: Physical Therapy

## 2022-12-16 VITALS — BP 116/59 | HR 60 | Temp 98.1°F | Resp 16 | Wt 139.5 lb

## 2022-12-16 DIAGNOSIS — Z9011 Acquired absence of right breast and nipple: Secondary | ICD-10-CM | POA: Insufficient documentation

## 2022-12-16 DIAGNOSIS — C50411 Malignant neoplasm of upper-outer quadrant of right female breast: Secondary | ICD-10-CM | POA: Diagnosis not present

## 2022-12-16 DIAGNOSIS — Z1231 Encounter for screening mammogram for malignant neoplasm of breast: Secondary | ICD-10-CM | POA: Diagnosis not present

## 2022-12-16 DIAGNOSIS — M85851 Other specified disorders of bone density and structure, right thigh: Secondary | ICD-10-CM | POA: Insufficient documentation

## 2022-12-16 DIAGNOSIS — Z170 Estrogen receptor positive status [ER+]: Secondary | ICD-10-CM | POA: Insufficient documentation

## 2022-12-16 DIAGNOSIS — Z79811 Long term (current) use of aromatase inhibitors: Secondary | ICD-10-CM | POA: Diagnosis not present

## 2022-12-16 DIAGNOSIS — Z01419 Encounter for gynecological examination (general) (routine) without abnormal findings: Secondary | ICD-10-CM | POA: Diagnosis not present

## 2022-12-16 NOTE — Progress Notes (Signed)
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, estrogen receptor positive (HCC)  05/14/2022 Cancer Staging   Staging form: Breast, AJCC 8th Edition - Clinical stage from 05/14/2022: Stage 0 (cTis (DCIS), cN0, cM0, G2, ER+, PR+, HER2: Not Assessed) - Signed by Malachy Mood, MD on 05/27/2022 Stage prefix: Initial diagnosis Histologic grading system: 3 grade system   07/31/2022 Cancer Staging   Staging form: Breast, AJCC 8th Edition - Pathologic stage from 07/31/2022: Stage IA (pT1b, pN0, cM0, G2, ER+, PR+, HER2-) - Signed by Malachy Mood, MD on 08/26/2022 Stage prefix: Initial diagnosis Histologic grading system: 3 grade system   07/31/2022 Surgery   Right mastectomy: 6 mm IDC, grade 2, and DCIS.  Surgical margins were negative, lymph nodes were negative.    07/31/2022 Oncotype testing   21/7%   08/2022 -  Anti-estrogen oral therapy   Anastrozole daily     INTERVAL HISTORY:  Meghan Mcdonald to review her survivorship care plan detailing her treatment course for breast cancer, as well as monitoring long-term side effects of that treatment, education regarding health maintenance, screening, and overall wellness and health promotion.     Overall, Meghan Mcdonald reports feeling quite well.  She is taking Anastrozole daily with good tolerance.   REVIEW OF SYSTEMS:  Review of Systems  Constitutional:  Negative for appetite change, chills, fatigue, fever and unexpected weight change.  HENT:   Negative for hearing loss, lump/mass and trouble swallowing.   Eyes:  Negative for eye problems and icterus.   Respiratory:  Negative for chest tightness, cough and shortness of breath.   Cardiovascular:  Negative for chest pain, leg swelling and palpitations.  Gastrointestinal:  Negative for abdominal distention, abdominal pain, constipation, diarrhea, nausea and vomiting.  Endocrine: Negative for hot flashes.  Genitourinary:  Negative for difficulty urinating.   Musculoskeletal:  Negative for arthralgias.  Skin:  Negative for itching and rash.  Neurological:  Negative for dizziness, extremity weakness, headaches and numbness.  Hematological:  Negative for adenopathy. Does not bruise/bleed easily.  Psychiatric/Behavioral:  Negative for depression. The patient is not nervous/anxious.    Breast: Denies any new nodularity, masses, tenderness, nipple changes, or nipple discharge.       PAST MEDICAL/SURGICAL HISTORY:  Past Medical History:  Diagnosis Date   Anemia    years ago   Anxiety    Arthritis    Breast cancer (HCC)    Chronic shoulder pain    COVID    x 2 both mild cases   Depression    Diabetes mellitus without complication (HCC)    GERD (gastroesophageal reflux disease)    History of kidney stones    Hyperlipemia    Hypertension    Insomnia    Lichen sclerosus of vulva    Migraine    Overweight    Rapid palpitations    Ventricular tachycardia (paroxysmal) [facility])    Past Surgical History:  Procedure Laterality Date   BLADDER SURGERY     BREAST IMPLANT REMOVAL Right 09/19/2022   Procedure: REMOVAL RIGHT TISSUE EXPANDER;  Surgeon: Peggye Form, DO;  Location: [facility] OR;  Service: Government social research officer;  Laterality: Right;   BREAST RECONSTRUCTION WITH PLACEMENT OF TISSUE EXPANDER AND FLEX HD (ACELLULAR HYDRATED DERMIS) Right 07/31/2022   Procedure: IMMEDIATE RIGHT BREAST RECONSTRUCTION WITH PLACEMENT OF TISSUE EXPANDER AND FLEX HD (ACELLULAR HYDRATED DERMIS);  Surgeon: Peggye Form, DO;  Location: [facility];  Service: Plastics;  Laterality: Right;   CARPAL TUNNEL  RELEASE     CATARACT EXTRACTION Bilateral    CHOLECYSTECTOMY     MASTECTOMY W/ SENTINEL NODE BIOPSY Right 07/31/2022   Procedure: RIGHT MASTECTOMY WITH SENTINEL LYMPH NODE BIOPSY;  Surgeon: Griselda Miner, MD;  Location: [facility];  Service: General;  Laterality: Right;   ORIF ANKLE FRACTURE Right 07/22/2018   Procedure: OPEN REDUCTION INTERNAL FIXATION (ORIF) RIGHT BIMALL ANKLE FRACTURE;  Surgeon: Terance Hart, MD;  Location: [facility];  Service: Orthopedics;  Laterality: Right;   ORIF TIBIA & FIBULA FRACTURES     TONSILLECTOMY       ALLERGIES:  Allergies  Allergen Reactions   Hibiclens [Chlorhexidine Gluconate] Rash    Skin blisters   Prednisone Rash    Patient states turns red all over    Aspirin Other (See Comments)    Severe Blood Thinning   Micardis [Telmisartan] Other (See Comments)    unknown   Amitriptyline Other (See Comments)    oversedation   Loestrin [Norethindrone Acet-Ethinyl Est] Rash     CURRENT MEDICATIONS:  Outpatient Encounter Medications as of 12/16/2022  Medication Sig   acetaminophen (TYLENOL) 500 MG tablet Take 1,000 mg by mouth as needed for moderate pain.   anastrozole (ARIMIDEX) 1 MG tablet TAKE 1 TABLET BY MOUTH EVERY DAY   atorvastatin (LIPITOR) 80 MG tablet Take 1 tablet (80 mg total) by mouth daily at 6 PM.   Azelastine HCl 137 MCG/SPRAY SOLN Place 1 spray into both nostrils in the morning and at bedtime.   calcium-vitamin D (OSCAL WITH D) 500-5 MG-MCG tablet Take 1 tablet by mouth daily with breakfast.   clobetasol ointment (TEMOVATE) 0.05 % Apply 1 Application topically 2 (two) times a week.   empagliflozin (JARDIANCE) 10 MG TABS tablet Take 10 mg by mouth in the morning.   famotidine (PEPCID) 20 MG tablet Take 20 mg by mouth daily as needed for heartburn or indigestion.   hydrochlorothiazide (HYDRODIURIL) 25 MG tablet Take 25 mg by mouth daily.   meclizine (ANTIVERT) 25 MG tablet Take 2 tablets (50 mg  total) by mouth 3 (three) times daily. (Patient taking differently: Take 25-50 mg by mouth 3 (three) times daily as needed for nausea or dizziness.)   meloxicam (MOBIC) 15 MG tablet Take 15 mg by mouth daily as needed (inflammation/pain.).   metFORMIN (GLUCOPHAGE-XR) 500 MG 24 hr tablet Take 500 mg by mouth at bedtime.   methocarbamol (ROBAXIN) 500 MG tablet Take 1 tablet (500 mg total) by mouth 2 (two) times daily as needed for muscle spasms.   metoprolol tartrate (LOPRESSOR) 50 MG tablet Take 50 mg by mouth 2 (two) times daily.   Multiple Vitamins-Minerals (PRESERVISION/LUTEIN PO) Take 1 tablet by mouth every evening.   Omega-3 Fatty Acids (FISH OIL) 1000 MG CAPS Take 1,000 mg by mouth every evening.   ondansetron (ZOFRAN-ODT) 4 MG disintegrating tablet Take 1 tablet (4 mg total) by mouth every 8 (eight) hours as needed for nausea or vomiting.   ramipril (ALTACE) 10 MG capsule Take 10 mg by mouth 2 (two) times daily.   No facility-administered encounter medications on file as of  12/16/2022.     ONCOLOGIC FAMILY HISTORY:  Family History  Problem Relation Age of Onset   Hypertension Mother    Hypertension Father      SOCIAL HISTORY:  Social History   Socioeconomic History   Marital status: Married    Spouse name: Not on file   Number of children: 2   Years of education: Not on file   Highest education level: Not on file  Occupational History   Not on file  Tobacco Use   Smoking status: Never    Passive exposure: Past   Smokeless tobacco: Never  Vaping Use   Vaping status: Never Used  Substance and Sexual Activity   Alcohol use: Yes    Alcohol/week: 3.0 standard drinks of alcohol    Types: 3 Glasses of wine per week    Comment: weekly   Drug use: Never   Sexual activity: Not on file  Other Topics Concern   Not on file  Social History Narrative   ** Merged History Encounter **        Lives with husband    Social Determinants of Health   Financial Resource Strain:  Not on file  Food Insecurity: Not on file  Transportation Needs: Not on file  Physical Activity: Not on file  Stress: Not on file  Social Connections: Not on file  Intimate Partner Violence: Not on file     OBSERVATIONS/OBJECTIVE:  BP (!) 116/59 (BP Location: Left Arm, Patient Position: Sitting)   Pulse 60   Temp 98.1 F (36.7 C) (Oral)   Resp 16   Wt 139 lb 8 oz (63.3 kg)   SpO2 98%   BMI 25.51 kg/m  GENERAL: Patient is a well appearing female in no acute distress HEENT:  Sclerae anicteric.  Oropharynx clear and moist. No ulcerations or evidence of oropharyngeal candidiasis. Neck is supple.  NODES:  No cervical, supraclavicular, or axillary lymphadenopathy palpated.  BREAST EXAM: Right breast status postmastectomy no sign of local recurrence left breast is benign. LUNGS:  Clear to auscultation bilaterally.  No wheezes or rhonchi. HEART:  Regular rate and rhythm. No murmur appreciated. ABDOMEN:  Soft, nontender.  Positive, normoactive bowel sounds. No organomegaly palpated. MSK:  No focal spinal tenderness to palpation. Full range of motion bilaterally in the upper extremities. EXTREMITIES:  No peripheral edema.   SKIN:  Clear with no obvious rashes or skin changes. No nail dyscrasia. NEURO:  Nonfocal. Well oriented.  Appropriate affect.   LABORATORY DATA:  None for this visit.  DIAGNOSTIC IMAGING:  None for this visit.   ASSESSMENT AND PLAN:  Ms.. Mcdonald is a pleasant 67 y.o. female with Stage 1A right breast invasive ductal carcinoma, ER+/PR+/HER2-, diagnosed in 04/2022, treated with mastectomy and anti-estrogen therapy with anastrozole beginning in 08/2022. She presents to the Survivorship Clinic for our initial meeting and routine follow-up post-completion of treatment for breast cancer.   1. Stage IA right breast cancer:  Ms. Spinuzzi is continuing to recover from definitive treatment for breast cancer. She will follow-up with her medical oncologist, Dr.  Mosetta Putt 02/2023 with  history and physical exam per surveillance protocol.  She will continue her anti-estrogen therapy with Anastrozole. Thus far, she is tolerating the Anastrozole well, with minimal side effects. Her left breast screening mammogram is due 04/2023; orders placed today.   Today, a comprehensive survivorship care plan and treatment summary was reviewed with the patient today detailing her breast cancer diagnosis, treatment course, potential late/long-term effects of treatment, appropriate follow-up  care with recommendations for the future, and patient education resources.  A copy of this summary, along with a letter will be sent to the patient's primary care provider via mail/fax/In Basket message after today's visit.    2. Bone health:  Given Ms. Rusk's age/history of breast cancer and her current treatment regimen including anti-estrogen therapy with Anastrozole, she is at risk for bone demineralization.  Her last DEXA scan was 08/2022 which demonstrated osteopenia with a T-score of -1.2 in the right femoral neck.  She was given education on specific activities to promote bone health.  She is recommended to repeat testing in May 2026  3. Cancer screening:  Due to Ms. Quinby's history and her age, she should receive screening for skin cancers, colon cancer, and gynecologic cancers.  The information and recommendations are listed on the patient's comprehensive care plan/treatment summary and were reviewed in detail with the patient.    4. Health maintenance and wellness promotion: Ms. Scribner was encouraged to consume 5-7 servings of fruits and vegetables per day. We reviewed the "Nutrition Rainbow" handout. She was also encouraged to engage in moderate to vigorous exercise for 30 minutes per day most days of the week.  She was instructed to limit her alcohol consumption and continue to abstain from tobacco use.     5. Support services/counseling: It is not uncommon for this period of the patient's cancer care  trajectory to be one of many emotions and stressors.   She was given information regarding our available services and encouraged to contact me with any questions or for help enrolling in any of our support group/programs.    Follow up instructions:    -Return to cancer center 02/2023  -Mammogram due in 04/2023 -She is welcome to return back to the Survivorship Clinic at any time; no additional follow-up needed at this time.  -Consider referral back to survivorship as a long-term survivor for continued surveillance  The patient was provided an opportunity to ask questions and all were answered. The patient agreed with the plan and demonstrated an understanding of the instructions.   Total encounter time:45 minutes*in face-to-face visit time, chart review, lab review, care coordination, order entry, and documentation of the encounter time.  Lillard Anes, NP 12/21/22 11:30 PM Medical Oncology and Hematology [facility] [address] [place], [place] [address] Tel. [phone]    Fax. [phone]  *Total Encounter Time as defined by the Centers for Medicare and Medicaid Services includes, in addition to the face-to-face time of a patient visit (documented in the note above) non-face-to-face time: obtaining and reviewing outside history, ordering and reviewing medications, tests or procedures, care coordination (communications with other health care professionals or caregivers) and documentation in the medical record.

## 2022-12-19 ENCOUNTER — Ambulatory Visit (INDEPENDENT_AMBULATORY_CARE_PROVIDER_SITE_OTHER): Payer: 59 | Admitting: Plastic Surgery

## 2022-12-19 ENCOUNTER — Encounter: Payer: Self-pay | Admitting: Plastic Surgery

## 2022-12-19 VITALS — BP 124/79 | HR 57

## 2022-12-19 DIAGNOSIS — C50411 Malignant neoplasm of upper-outer quadrant of right female breast: Secondary | ICD-10-CM

## 2022-12-19 DIAGNOSIS — Z9889 Other specified postprocedural states: Secondary | ICD-10-CM

## 2022-12-19 DIAGNOSIS — N6489 Other specified disorders of breast: Secondary | ICD-10-CM

## 2022-12-19 DIAGNOSIS — Z170 Estrogen receptor positive status [ER+]: Secondary | ICD-10-CM | POA: Diagnosis not present

## 2022-12-19 NOTE — Progress Notes (Signed)
   Subjective:    Patient ID: Meghan Mcdonald, female    DOB: [birth year], 67 y.o.   MRN: [id number]  The patient is a 67 year old female here for follow-up after reconstruction and then removal of the expander.  The patient was originally seen in March 2024.  She was diagnosed with a right breast cancer after routine screening mammogram.  She had 2 areas of calcification that were approximately 7 mm in size and 3 mm apart.  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  She has a history of hyperlipidemia, hypertension, diabetes and gastric reflux.  Past surgical history included cholecystectomy, carpal tunnel and ankle surgery.  She is 5 feet 2 inches tall and weighs around 140 pounds.  She underwent a right sided mastectomy in April with expander placement.  She had some complications and had the expander removed and may.  She does want to proceed with reconstruction.  We talked about doing an expander only and I am concerned she will have the same results as last time.  She does want to move forward with a latissimus muscle flap and expander placement.    Review of Systems  Constitutional: Negative.   HENT: Negative.    Eyes: Negative.   Respiratory: Negative.  Negative for chest tightness.   Cardiovascular: Negative.   Gastrointestinal: Negative.   Endocrine: Negative.   Genitourinary: Negative.   Musculoskeletal: Negative.        Objective:   Physical Exam Constitutional:      Appearance: Normal appearance.  HENT:     Head: Atraumatic.  Cardiovascular:     Rate and Rhythm: Normal rate.     Pulses: Normal pulses.  Pulmonary:     Effort: Pulmonary effort is normal.  Skin:    General: Skin is warm.     Capillary Refill: Capillary refill takes less than 2 seconds.     Coloration: Skin is not jaundiced.  Neurological:     Mental Status: She is alert and oriented to person, place, and time.  Psychiatric:        Mood and Affect: Mood normal.         Behavior: Behavior normal.        Thought Content: Thought content normal.        Judgment: Judgment normal.         Assessment & Plan:     ICD-10-CM   1. Malignant neoplasm of upper-outer quadrant of right breast in female, estrogen receptor positive (HCC)  C50.411    Z17.0     2. S/P breast reconstruction  Z98.890     3. Postoperative breast asymmetry  N64.89        The patient would like to move forward with right breast reconstruction with latissimus muscle flap and expander placement.  Her insurance changes in October so we will submit this after the change takes place.  The risks and complications were reviewed with the patient and still remain somewhat high due to her diabetes.

## 2022-12-30 ENCOUNTER — Encounter: Payer: Self-pay | Admitting: Plastic Surgery

## 2022-12-30 ENCOUNTER — Ambulatory Visit (INDEPENDENT_AMBULATORY_CARE_PROVIDER_SITE_OTHER): Payer: 59 | Admitting: Plastic Surgery

## 2022-12-30 VITALS — BP 100/63 | HR 59

## 2022-12-30 DIAGNOSIS — Z170 Estrogen receptor positive status [ER+]: Secondary | ICD-10-CM | POA: Diagnosis not present

## 2022-12-30 DIAGNOSIS — N651 Disproportion of reconstructed breast: Secondary | ICD-10-CM

## 2022-12-30 DIAGNOSIS — N6489 Other specified disorders of breast: Secondary | ICD-10-CM

## 2022-12-30 DIAGNOSIS — Z9889 Other specified postprocedural states: Secondary | ICD-10-CM

## 2022-12-30 DIAGNOSIS — C50411 Malignant neoplasm of upper-outer quadrant of right female breast: Secondary | ICD-10-CM

## 2022-12-30 NOTE — Progress Notes (Signed)
   Subjective:    Patient ID: Meghan Mcdonald, female    DOB: 22-Jan-1956, 67 y.o.   MRN: [id number]  The patient is a 67 year old female here for follow-up on her breast reconstruction.  I saw her in March with a new diagnosis of right breast cancer after a routine screening mammogram.  She had a biopsy that showed ductal carcinoma in situ and it was estrogen and progesterone positive.  Past medical history is positive for diabetes, hyperlipidemia, hypertension, and GI reflux.  She underwent a right mastectomy with expander placement in April.  She had some trouble with her skin flaps and we ended up having to remove the expander Aug 20, 2022.  She is now doing really well and the skin has healed very nicely.  She had some vacation that she wanted to accomplish prior to any additional surgery.  Now she is a bit hesitant to have any kind of an implant put back in.  And she is wondering if she is a candidate for Diep flap.  She did have a open cholecystectomy a number of years ago and has that scar.  With that and her diabetes I think that she does have a higher risk of complications but it is worth getting more information for her.      Review of Systems  Constitutional: Negative.   Eyes: Negative.   Respiratory: Negative.    Gastrointestinal: Negative.   Endocrine: Negative.   Genitourinary: Negative.   Musculoskeletal: Negative.   Hematological: Negative.        Objective:   Physical Exam Vitals reviewed.  Constitutional:      Appearance: Normal appearance.  HENT:     Head: Atraumatic.  Cardiovascular:     Rate and Rhythm: Normal rate.     Pulses: Normal pulses.  Pulmonary:     Effort: Pulmonary effort is normal.  Skin:    General: Skin is warm.     Capillary Refill: Capillary refill takes less than 2 seconds.     Coloration: Skin is not jaundiced.     Findings: No bruising.  Neurological:     Mental Status: She is alert and oriented to person, place, and time.  Psychiatric:         Mood and Affect: Mood normal.        Behavior: Behavior normal.        Thought Content: Thought content normal.        Judgment: Judgment normal.        Assessment & Plan:     ICD-10-CM   1. Postoperative breast asymmetry  N64.89     2. S/P breast reconstruction  Z98.890     3. Malignant neoplasm of upper-outer quadrant of right breast in female, estrogen receptor positive (HCC)  C50.411    Z17.0        Pictures were obtained of the patient and placed in the chart with the patient's or guardian's permission.  She is welcome to call me if she needs anything in the meantime.   Referral made to Dr. Donata Duff at [facility].  She will gets more information about a Diep flap.

## 2022-12-31 ENCOUNTER — Telehealth: Payer: Self-pay | Admitting: *Deleted

## 2022-12-31 NOTE — Telephone Encounter (Signed)
Faxed patient's demographics,insurance information,and recent office notes to Dr. Maudie Flakes office for an appointment to be made.  Patient aware that the information have been sent, and that they will be reaching out to her regarding an appointment.    Patient verbalized and agreed.//AB/CMA

## 2023-01-01 ENCOUNTER — Telehealth: Payer: Self-pay | Admitting: *Deleted

## 2023-01-01 NOTE — Telephone Encounter (Signed)
Pt informed having symptoms from anastrozole of increase in mood issues and in the last couple weeks, having episodes of weeping for no apparent reason. This week pt started having "significant anxiety and a feeling of dread with a knot in my stomach" Informed pt these can be s/e from anastrozole.  Gave pt option to discuss with Dr. Mosetta Putt or stop anastrozole x2wk, see if symptoms improve and have f/u with Dr. Mosetta Putt in 2 wks to discuss new AI. Pt will stop anastrozole and see Dr. Mosetta Putt on 9/25. Scheduled and confirmed appt. No further needs voiced at this time.

## 2023-01-07 ENCOUNTER — Encounter: Payer: Self-pay | Admitting: Plastic Surgery

## 2023-01-09 ENCOUNTER — Telehealth: Payer: Self-pay | Admitting: *Deleted

## 2023-01-09 NOTE — Telephone Encounter (Signed)
Patient has been called, and informed that the information has been sent to Dr. Meyer Cory office, and they will be reaching out to her to schedule an appointment.//AB/CMA

## 2023-01-12 ENCOUNTER — Telehealth: Payer: Self-pay | Admitting: Plastic Surgery

## 2023-01-12 NOTE — Telephone Encounter (Signed)
Patient called back to follow up on this.

## 2023-01-12 NOTE — Telephone Encounter (Signed)
Pt called and says that Dr D was supposed to send a referral to a Engineer, petroleum in [place]. They have not received the referrals. After looking in the pt referrals, it looks like the referral was sent back to [facility]. She needs this fixed and the pt would like a call back when the referral is sent to the correct dept.

## 2023-01-13 ENCOUNTER — Telehealth: Payer: Self-pay | Admitting: Plastic Surgery

## 2023-01-13 NOTE — Telephone Encounter (Signed)
Called Dr Donata Duff office, we do have the correct fax, I did try to fax again, I will call to see if they get it again, will call pt once we know they have it in hand.

## 2023-01-14 ENCOUNTER — Other Ambulatory Visit: Payer: Self-pay

## 2023-01-14 ENCOUNTER — Inpatient Hospital Stay: Payer: 59 | Attending: Hematology | Admitting: Hematology

## 2023-01-14 VITALS — BP 106/65 | HR 64 | Temp 97.9°F | Resp 15 | Ht 62.0 in | Wt 141.2 lb

## 2023-01-14 DIAGNOSIS — C50411 Malignant neoplasm of upper-outer quadrant of right female breast: Secondary | ICD-10-CM | POA: Insufficient documentation

## 2023-01-14 DIAGNOSIS — K219 Gastro-esophageal reflux disease without esophagitis: Secondary | ICD-10-CM | POA: Diagnosis not present

## 2023-01-14 DIAGNOSIS — Z7984 Long term (current) use of oral hypoglycemic drugs: Secondary | ICD-10-CM | POA: Insufficient documentation

## 2023-01-14 DIAGNOSIS — E119 Type 2 diabetes mellitus without complications: Secondary | ICD-10-CM | POA: Diagnosis not present

## 2023-01-14 DIAGNOSIS — Z8616 Personal history of COVID-19: Secondary | ICD-10-CM | POA: Diagnosis not present

## 2023-01-14 DIAGNOSIS — Z170 Estrogen receptor positive status [ER+]: Secondary | ICD-10-CM | POA: Insufficient documentation

## 2023-01-14 DIAGNOSIS — I10 Essential (primary) hypertension: Secondary | ICD-10-CM | POA: Diagnosis not present

## 2023-01-14 DIAGNOSIS — Z87442 Personal history of urinary calculi: Secondary | ICD-10-CM | POA: Insufficient documentation

## 2023-01-14 DIAGNOSIS — Z79899 Other long term (current) drug therapy: Secondary | ICD-10-CM | POA: Diagnosis not present

## 2023-01-14 DIAGNOSIS — Z9011 Acquired absence of right breast and nipple: Secondary | ICD-10-CM | POA: Insufficient documentation

## 2023-01-14 DIAGNOSIS — E785 Hyperlipidemia, unspecified: Secondary | ICD-10-CM | POA: Insufficient documentation

## 2023-01-14 DIAGNOSIS — Z79811 Long term (current) use of aromatase inhibitors: Secondary | ICD-10-CM | POA: Insufficient documentation

## 2023-01-14 DIAGNOSIS — D649 Anemia, unspecified: Secondary | ICD-10-CM | POA: Diagnosis not present

## 2023-01-14 MED ORDER — TAMOXIFEN CITRATE 10 MG PO TABS: 10.0000 mg | ORAL_TABLET | Freq: Every day | ORAL | 2 refills | Status: DC

## 2023-01-14 NOTE — Progress Notes (Signed)
[facility]   Telephone:([phone]) [phone] [phone]   Clinic Follow up Note   Patient Care Team: Verl Blalock as PCP - General (Family Medicine) Griselda Miner, MD as Consulting Physician (General Surgery) Malachy Mood, MD as Consulting Physician (Hematology) Dorothy Puffer, MD as Consulting Physician (Radiation Oncology)  Date of Service:  01/14/2023  CHIEF COMPLAINT: f/u of right breast cancer   Oncology History   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, estrogen receptor positive (HCC) -pT1bN0M0, stage IA, ER+/PR+HER2-, G2, 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 -She underwent a right mastectomy on July 31, 2022.  I reviewed her surgical pathology findings with her in detail, it showed 6 mm invasive ductal carcinoma, grade 2, and DCIS.  Surgical margins were negative, lymph nodes were negative. -We discussed her Oncotype results, which showed recurrence score of 21.  This is considered low risk disease, adjuvant chemotherapy is not recommended. -She does not need postmastectomy radiation  -She started adjuvant anastrozole in July 2024, she stopped in August 2024 due to mood swings and depression.   Assessment and Plan    Breast Cancer Patient experienced significant mood swings and depression while on Anastrozole. Symptoms improved significantly after discontinuation of the medication. Discussed the risks and benefits of alternative treatments including Tamoxifen and no treatment. -Start Tamoxifen 10mg  daily and monitor for tolerance and side effects. -Stop Tamoxifen 2 weeks prior to planned reconstructive surgery in January 2025 and resume 2 weeks post-surgery. -Return for follow-up appointment in November 2024.  Breast Reconstruction Patient had complications with tissue expander leading to its removal. Now planning for autologous tissue reconstruction. -Consultation with Dr. Donata Duff  in [place] on November 6th, 2024 for discussion of deep flap procedure. -Plan for potential surgery in January 2025.  plan -Next mammogram due in January 2025. -She is willing to try tamoxifen, start at 10 mg daily -She is scheduled to see me in November, may increase tamoxifen to 20 mg daily if she tolerates well.        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, estrogen receptor positive (HCC)  05/14/2022 Cancer Staging   Staging form: Breast, AJCC 8th Edition - Clinical stage from 05/14/2022: Stage 0 (cTis (DCIS), cN0, cM0, G2, ER+, PR+, HER2: Not Assessed) - Signed by Malachy Mood, MD on 05/27/2022 Stage prefix: Initial diagnosis Histologic grading system: 3 grade system   07/31/2022 Cancer Staging   Staging form: Breast, AJCC 8th Edition - Pathologic stage from 07/31/2022: Stage IA (pT1b, pN0, cM0, G2, ER+, PR+, HER2-) - Signed by Malachy Mood, MD on 08/26/2022 Stage prefix: Initial diagnosis Histologic grading system: 3 grade system   07/31/2022 Surgery   Right mastectomy: 6 mm IDC, grade 2, and DCIS.  Surgical margins were negative, lymph nodes were negative.    07/31/2022 Oncotype testing   21/7%   08/2022 -  Anti-estrogen oral therapy   Anastrozole daily      Discussed the use of AI scribe software for clinical note transcription with the patient, who gave verbal consent to proceed.  History of Present Illness   The patient, a 67 year old female with a history of breast cancer, presents for follow-up. She reports severe mood swings and episodes of depression while on  Anastrozole, which she started in June. The mood swings were characterized by sudden bouts of weeping and an overwhelming sense of dread. These symptoms started a couple of months after starting Anastrozole and worsened over time. The patient stopped Anastrozole two weeks prior to this visit, upon which she noticed a significant improvement in her mood.  The patient also reports a history of depression, which she managed without medication. She has had two previous episodes of depression, once after her father's death and once while caring for her mother with Alzheimer's disease.  In addition to her mental health concerns, the patient is in the process of breast reconstruction following her cancer treatment. She had a tissue expander placed, but it was removed due to complications including swelling, fluid buildup, and a non-healing incision. The patient is now considering a deep procedure for reconstruction using her own tissue.         All other systems were reviewed with the patient and are negative.  MEDICAL HISTORY:  Past Medical History:  Diagnosis Date   Anemia    years ago   Anxiety    Arthritis    Breast cancer (HCC)    Chronic shoulder pain    COVID    x 2 both mild cases   Depression    Diabetes mellitus without complication (HCC)    GERD (gastroesophageal reflux disease)    History of kidney stones    Hyperlipemia    Hypertension    Insomnia    Lichen sclerosus of vulva    Migraine    Overweight    Rapid palpitations    Ventricular tachycardia (paroxysmal) (HCC)     SURGICAL HISTORY: Past Surgical History:  Procedure Laterality Date   BLADDER SURGERY     BREAST IMPLANT REMOVAL Right 09/19/2022   Procedure: REMOVAL RIGHT TISSUE EXPANDER;  Surgeon: Peggye Form, DO;  Location: [facility];  Service: Plastics;  Laterality: Right;   BREAST RECONSTRUCTION WITH PLACEMENT OF TISSUE EXPANDER AND FLEX HD (ACELLULAR HYDRATED DERMIS) Right 07/31/2022   Procedure: IMMEDIATE  RIGHT BREAST RECONSTRUCTION WITH PLACEMENT OF TISSUE EXPANDER AND FLEX HD (ACELLULAR HYDRATED DERMIS);  Surgeon: Peggye Form, DO;  Location: [facility];  Service: Plastics;  Laterality: Right;   CARPAL TUNNEL RELEASE     CATARACT EXTRACTION Bilateral    CHOLECYSTECTOMY     MASTECTOMY W/ SENTINEL NODE BIOPSY Right 07/31/2022   Procedure: RIGHT MASTECTOMY WITH SENTINEL LYMPH NODE BIOPSY;  Surgeon: Griselda Miner, MD;  Location: [facility];  Service: General;  Laterality: Right;   ORIF ANKLE FRACTURE Right 07/22/2018   Procedure: OPEN REDUCTION INTERNAL FIXATION (ORIF) RIGHT BIMALL ANKLE FRACTURE;  Surgeon: Terance Hart, MD;  Location: [facility];  Service: Orthopedics;  Laterality: Right;   ORIF TIBIA & FIBULA FRACTURES     TONSILLECTOMY      I have reviewed the social history and family history with the patient and they are unchanged from previous note.  ALLERGIES:  is allergic to hibiclens [chlorhexidine gluconate], prednisone, aspirin, micardis [telmisartan], amitriptyline, and loestrin [norethindrone acet-ethinyl est].  MEDICATIONS:  Current Outpatient Medications  Medication Sig Dispense Refill   tamoxifen (NOLVADEX) 10 MG tablet Take 1 tablet (10 mg total) by mouth daily. 30 tablet 2   acetaminophen (TYLENOL) 500 MG tablet  Take 1,000 mg by mouth as needed for moderate pain.     atorvastatin (LIPITOR) 80 MG tablet Take 1 tablet (80 mg total) by mouth daily at 6 PM. 30 tablet 0   Azelastine HCl 137 MCG/SPRAY SOLN Place 1 spray into both nostrils in the morning and at bedtime.     calcium-vitamin D (OSCAL WITH D) 500-5 MG-MCG tablet Take 1 tablet by mouth daily with breakfast.     clobetasol ointment (TEMOVATE) 0.05 % Apply 1 Application topically 2 (two) times a week.     empagliflozin (JARDIANCE) 10 MG TABS tablet Take 10 mg by mouth in the morning.     famotidine (PEPCID) 20 MG tablet Take 20 mg by mouth daily as needed for  heartburn or indigestion.     hydrochlorothiazide (HYDRODIURIL) 25 MG tablet Take 25 mg by mouth daily.     meclizine (ANTIVERT) 25 MG tablet Take 2 tablets (50 mg total) by mouth 3 (three) times daily. (Patient taking differently: Take 25-50 mg by mouth 3 (three) times daily as needed for nausea or dizziness.) 30 tablet 0   meloxicam (MOBIC) 15 MG tablet Take 15 mg by mouth daily as needed (inflammation/pain.).     metFORMIN (GLUCOPHAGE-XR) 500 MG 24 hr tablet Take 500 mg by mouth at bedtime.     methocarbamol (ROBAXIN) 500 MG tablet Take 1 tablet (500 mg total) by mouth 2 (two) times daily as needed for muscle spasms. 20 tablet 0   metoprolol tartrate (LOPRESSOR) 50 MG tablet Take 50 mg by mouth 2 (two) times daily.     Multiple Vitamins-Minerals (PRESERVISION/LUTEIN PO) Take 1 tablet by mouth every evening.     Omega-3 Fatty Acids (FISH OIL) 1000 MG CAPS Take 1,000 mg by mouth every evening.     ondansetron (ZOFRAN-ODT) 4 MG disintegrating tablet Take 1 tablet (4 mg total) by mouth every 8 (eight) hours as needed for nausea or vomiting. 20 tablet 0   ramipril (ALTACE) 10 MG capsule Take 10 mg by mouth 2 (two) times daily.     No current facility-administered medications for this visit.    PHYSICAL EXAMINATION: ECOG PERFORMANCE STATUS: 0 - Asymptomatic  Vitals:   01/14/23 1615  BP: 106/65  Pulse: 64  Resp: 15  Temp: 97.9 F (36.6 C)  SpO2: 96%   Wt Readings from Last 3 Encounters:  01/14/23 141 lb 3.2 oz (64 kg)  12/16/22 139 lb 8 oz (63.3 kg)  10/27/22 142 lb 2 oz (64.5 kg)      GENERAL:alert, no distress and comfortable SKIN: skin color, texture, turgor are normal, no rashes or significant lesions EYES: normal, Conjunctiva are pink and non-injected, sclera clear NECK: supple, thyroid normal size, non-tender, without nodularity LYMPH:  no palpable lymphadenopathy in the cervical, axillary  LUNGS: clear to auscultation and percussion with normal breathing effort HEART:  regular rate & rhythm and no murmurs and no lower extremity edema ABDOMEN:abdomen soft, non-tender and normal bowel sounds Musculoskeletal:no cyanosis of digits and no clubbing  NEURO: alert & oriented x 3 with fluent speech, no focal motor/sensory deficits     LABORATORY DATA:  I have reviewed the data as listed    Latest Ref Rng & Units 09/19/2022    1:45 PM 05/28/2022   11:49 AM 12/24/2017    4:34 AM  CBC  WBC 4.0 - 10.5 K/uL 5.6  7.1  5.4   Hemoglobin 12.0 - 15.0 g/dL 16.1  09.6  04.5   Hematocrit 36.0 - 46.0 % 44.0  48.3  43.2   Platelets 150 - 400 K/uL 254  195  194         Latest Ref Rng & Units 09/19/2022    1:45 PM 07/28/2022    2:30 PM 05/28/2022   11:49 AM  CMP  Glucose 70 - 99 mg/dL 664  403  474   BUN 8 - 23 mg/dL 10  11  15    Creatinine 0.44 - 1.00 mg/dL 2.59  5.63  8.75   Sodium 135 - 145 mmol/L 132  137  139   Potassium 3.5 - 5.1 mmol/L 3.6  3.4  3.4   Chloride 98 - 111 mmol/L 99  100  100   CO2 22 - 32 mmol/L 22  25  34   Calcium 8.9 - 10.3 mg/dL 8.8  9.3  64.3   Total Protein 6.5 - 8.1 g/dL   7.0   Total Bilirubin 0.3 - 1.2 mg/dL   1.0   Alkaline Phos 38 - 126 U/L   80   AST 15 - 41 U/L   48   ALT 0 - 44 U/L   72       RADIOGRAPHIC STUDIES: I have personally reviewed the radiological images as listed and agreed with the findings in the report. No results found.    No orders of the defined types were placed in this encounter.  All questions were answered. The patient knows to call the clinic with any problems, questions or concerns. No barriers to learning was detected. The total time spent in the appointment was 30 minutes.     Malachy Mood, MD 01/14/2023

## 2023-01-14 NOTE — Assessment & Plan Note (Signed)
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 -She underwent a right mastectomy on July 31, 2022.  I reviewed her surgical pathology findings with her in detail, it showed 6 mm invasive ductal carcinoma, grade 2, and DCIS.  Surgical margins were negative, lymph nodes were negative. -We discussed her Oncotype results, which showed recurrence score of 21.  This is considered low risk disease, adjuvant chemotherapy is not recommended. -She does not need postmastectomy radiation  -She started adjuvant anastrozole in July 2024, tolerating well so far.

## 2023-01-20 DIAGNOSIS — Z170 Estrogen receptor positive status [ER+]: Secondary | ICD-10-CM | POA: Diagnosis not present

## 2023-01-20 DIAGNOSIS — N6489 Other specified disorders of breast: Secondary | ICD-10-CM | POA: Diagnosis not present

## 2023-01-20 DIAGNOSIS — C50411 Malignant neoplasm of upper-outer quadrant of right female breast: Secondary | ICD-10-CM | POA: Diagnosis not present

## 2023-02-02 ENCOUNTER — Ambulatory Visit: Payer: Medicare HMO | Attending: Hematology

## 2023-02-02 VITALS — Wt 139.2 lb

## 2023-02-02 DIAGNOSIS — Z483 Aftercare following surgery for neoplasm: Secondary | ICD-10-CM | POA: Insufficient documentation

## 2023-02-02 NOTE — Therapy (Signed)
OUTPATIENT PHYSICAL THERAPY SOZO SCREENING NOTE   Patient Name: Meghan Mcdonald MRN: [id number] DOB:04-14-1956, 67 y.o., female Today's Date: 02/02/2023  PCP: Verl Blalock REFERRING PROVIDER: Malachy Mood, MD   PT End of Session - 02/02/23 [phone]     Visit Number 8   # unchanged due to screen only   PT Start Time 0953    PT Stop Time 0957    PT Time Calculation (min) 4 min    Activity Tolerance Patient tolerated treatment well    Behavior During Therapy WFL for tasks assessed/performed             Past Medical History:  Diagnosis Date   Anemia    years ago   Anxiety    Arthritis    Breast cancer (HCC)    Chronic shoulder pain    COVID    x 2 both mild cases   Depression    Diabetes mellitus without complication (HCC)    GERD (gastroesophageal reflux disease)    History of kidney stones    Hyperlipemia    Hypertension    Insomnia    Lichen sclerosus of vulva    Migraine    Overweight    Rapid palpitations    Ventricular tachycardia (paroxysmal) (HCC)    Past Surgical History:  Procedure Laterality Date   BLADDER SURGERY     BREAST IMPLANT REMOVAL Right 09/19/2022   Procedure: REMOVAL RIGHT TISSUE EXPANDER;  Surgeon: Peggye Form, DO;  Location: [facility];  Service: Plastics;  Laterality: Right;   BREAST RECONSTRUCTION WITH PLACEMENT OF TISSUE EXPANDER AND FLEX HD (ACELLULAR HYDRATED DERMIS) Right 07/31/2022   Procedure: IMMEDIATE RIGHT BREAST RECONSTRUCTION WITH PLACEMENT OF TISSUE EXPANDER AND FLEX HD (ACELLULAR HYDRATED DERMIS);  Surgeon: Peggye Form, DO;  Location: [facility];  Service: Plastics;  Laterality: Right;   CARPAL TUNNEL RELEASE     CATARACT EXTRACTION Bilateral    CHOLECYSTECTOMY     MASTECTOMY W/ SENTINEL NODE BIOPSY Right 07/31/2022   Procedure: RIGHT MASTECTOMY WITH SENTINEL LYMPH NODE BIOPSY;  Surgeon: Griselda Miner, MD;  Location: [facility];  Service: General;  Laterality: Right;   ORIF ANKLE  FRACTURE Right 07/22/2018   Procedure: OPEN REDUCTION INTERNAL FIXATION (ORIF) RIGHT BIMALL ANKLE FRACTURE;  Surgeon: Terance Hart, MD;  Location: [facility];  Service: Orthopedics;  Laterality: Right;   ORIF TIBIA & FIBULA FRACTURES     TONSILLECTOMY     Patient Active Problem List   Diagnosis Date Noted   Postoperative breast asymmetry 12/19/2022   S/P breast reconstruction 07/31/2022   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, estrogen receptor positive (HCC) 05/26/2022   Acute CVA (cerebrovascular accident) (HCC) 12/23/2017   Severe vertigo 12/22/2017   Vertigo 12/22/2017   Dyslipidemia 12/22/2017   Essential hypertension 12/22/2017   Nausea & vomiting 12/22/2017    REFERRING DIAG: right breast cancer at risk for lymphedema  THERAPY DIAG:  Aftercare following surgery for neoplasm  PERTINENT HISTORY: Anemia, anxiety, breast CA s/p R mastectomy 07/31/22 and implant removal 09/09/22, depression, DM, GERD, HLD, HTN, migraine, palpitations, R ankle ORIF   PRECAUTIONS: right UE Lymphedema risk, None  SUBJECTIVE: Pt returns for her 3 month L-Dex screen. "I can't get the DIEP flap bc of gallbladder surgery in my past so I'll see Dr. Ulice Bold about other options."  PAIN:  Are you having pain? No  SOZO SCREENING: Patient was assessed today using the SOZO machine to  determine the lymphedema index score. This was compared to her baseline score. It was determined that she is within the recommended range when compared to her baseline and no further action is needed at this time. She will continue SOZO screenings. These are done every 3 months for 2 years post operatively followed by every 6 months for 2 years, and then annually.   L-DEX FLOWSHEETS - 02/02/23 0900       L-DEX LYMPHEDEMA SCREENING   Measurement Type Unilateral    L-DEX MEASUREMENT EXTREMITY Upper Extremity    POSITION  Standing    DOMINANT SIDE Right    At Risk Side Right     BASELINE SCORE (UNILATERAL) -4    L-DEX SCORE (UNILATERAL) -4.3    VALUE CHANGE (UNILAT) -0.3               Hermenia Bers, PTA 02/02/2023, 9:56 AM

## 2023-02-03 ENCOUNTER — Encounter: Payer: Self-pay | Admitting: Plastic Surgery

## 2023-02-03 ENCOUNTER — Ambulatory Visit (INDEPENDENT_AMBULATORY_CARE_PROVIDER_SITE_OTHER): Payer: Medicare HMO | Admitting: Plastic Surgery

## 2023-02-03 DIAGNOSIS — N651 Disproportion of reconstructed breast: Secondary | ICD-10-CM

## 2023-02-03 DIAGNOSIS — C50411 Malignant neoplasm of upper-outer quadrant of right female breast: Secondary | ICD-10-CM | POA: Diagnosis not present

## 2023-02-03 DIAGNOSIS — Z170 Estrogen receptor positive status [ER+]: Secondary | ICD-10-CM

## 2023-02-03 DIAGNOSIS — N6489 Other specified disorders of breast: Secondary | ICD-10-CM

## 2023-02-03 NOTE — Progress Notes (Signed)
Subjective:    Patient ID: Meghan Mcdonald, female    DOB: [birth year], 67 y.o.   MRN: [id number]  The patient is a 67 year old female joining me by phone for further discussion about breast reconstruction.  She was diagnosed in March 2024 with right breast cancer.  The lesions were estrogen and progesterone positive.  She underwent a right sided mastectomy in April 2024 with placement of an expander.  She had a persistent seroma and some redness so she decided to have the expander removed.  It was done May 31. She is 5 feet 2 inches tall and weighs 140 pounds with a preoperative bra size of a C.  She thought maybe she would like an autologous reconstruction so she had an appointment with Dr. Donata Duff.  Unfortunately due to her previous cholecystectomy she is not a candidate for a Diep flap.  She would like to move ahead with reconstruction and do a latissimus with expander placement.    Review of Systems  Constitutional: Negative.   Eyes: Negative.   Respiratory: Negative.    Cardiovascular: Negative.        Objective:   Physical Exam      Assessment & Plan:     ICD-10-CM   1. Postoperative breast asymmetry  N64.89     2. Malignant neoplasm of upper-outer quadrant of right breast in female, estrogen receptor positive (HCC)  C50.411    Z17.0       I connected with Meghan Mcdonald on 02/03/23 by phone and verified that I am speaking with the correct person using two identifiers.  Patient was at home on her front porch and I was at the office.  We spent 10 minutes in discussion.  Patient would like to move ahead with a latissimus muscle flap and expander placement for the right breast.   I discussed the limitations of evaluation and management by telemedicine. The patient expressed understanding and agreed to proceed.

## 2023-02-06 ENCOUNTER — Ambulatory Visit: Payer: 59 | Admitting: Plastic Surgery

## 2023-02-13 DIAGNOSIS — L853 Xerosis cutis: Secondary | ICD-10-CM | POA: Diagnosis not present

## 2023-02-13 DIAGNOSIS — D225 Melanocytic nevi of trunk: Secondary | ICD-10-CM | POA: Diagnosis not present

## 2023-02-13 DIAGNOSIS — D0461 Carcinoma in situ of skin of right upper limb, including shoulder: Secondary | ICD-10-CM | POA: Diagnosis not present

## 2023-02-13 DIAGNOSIS — L821 Other seborrheic keratosis: Secondary | ICD-10-CM | POA: Diagnosis not present

## 2023-02-24 ENCOUNTER — Telehealth: Payer: Self-pay | Admitting: Hematology

## 2023-02-26 ENCOUNTER — Inpatient Hospital Stay: Payer: Medicare HMO

## 2023-02-26 ENCOUNTER — Inpatient Hospital Stay: Payer: Medicare HMO | Admitting: Hematology

## 2023-03-03 ENCOUNTER — Telehealth: Payer: Self-pay

## 2023-03-03 ENCOUNTER — Inpatient Hospital Stay (HOSPITAL_BASED_OUTPATIENT_CLINIC_OR_DEPARTMENT_OTHER): Payer: Medicare HMO | Admitting: Hematology

## 2023-03-03 ENCOUNTER — Other Ambulatory Visit: Payer: Self-pay

## 2023-03-03 ENCOUNTER — Encounter: Payer: Self-pay | Admitting: Hematology

## 2023-03-03 ENCOUNTER — Inpatient Hospital Stay: Payer: Medicare HMO | Attending: Hematology

## 2023-03-03 VITALS — BP 103/54 | HR 61 | Temp 97.7°F | Resp 16 | Wt 142.5 lb

## 2023-03-03 DIAGNOSIS — Z9011 Acquired absence of right breast and nipple: Secondary | ICD-10-CM | POA: Insufficient documentation

## 2023-03-03 DIAGNOSIS — D0511 Intraductal carcinoma in situ of right breast: Secondary | ICD-10-CM

## 2023-03-03 DIAGNOSIS — C50411 Malignant neoplasm of upper-outer quadrant of right female breast: Secondary | ICD-10-CM | POA: Insufficient documentation

## 2023-03-03 DIAGNOSIS — M8580 Other specified disorders of bone density and structure, unspecified site: Secondary | ICD-10-CM | POA: Insufficient documentation

## 2023-03-03 DIAGNOSIS — Z1231 Encounter for screening mammogram for malignant neoplasm of breast: Secondary | ICD-10-CM

## 2023-03-03 DIAGNOSIS — Z170 Estrogen receptor positive status [ER+]: Secondary | ICD-10-CM | POA: Insufficient documentation

## 2023-03-03 DIAGNOSIS — Z79899 Other long term (current) drug therapy: Secondary | ICD-10-CM | POA: Insufficient documentation

## 2023-03-03 DIAGNOSIS — Z79811 Long term (current) use of aromatase inhibitors: Secondary | ICD-10-CM | POA: Insufficient documentation

## 2023-03-03 DIAGNOSIS — R7401 Elevation of levels of liver transaminase levels: Secondary | ICD-10-CM | POA: Insufficient documentation

## 2023-03-03 LAB — CBC WITH DIFFERENTIAL (CANCER CENTER ONLY)
Abs Immature Granulocytes: 0.02 10*3/uL (ref 0.00–0.07)
Basophils Absolute: 0.1 10*3/uL (ref 0.0–0.1)
Basophils Relative: 1 %
Eosinophils Absolute: 0.2 10*3/uL (ref 0.0–0.5)
Eosinophils Relative: 4 %
HCT: 47 % — ABNORMAL HIGH (ref 36.0–46.0)
Hemoglobin: 15.8 g/dL — ABNORMAL HIGH (ref 12.0–15.0)
Immature Granulocytes: 0 %
Lymphocytes Relative: 37 %
Lymphs Abs: 1.8 10*3/uL (ref 0.7–4.0)
MCH: 30.5 pg (ref 26.0–34.0)
MCHC: 33.6 g/dL (ref 30.0–36.0)
MCV: 90.7 fL (ref 80.0–100.0)
Monocytes Absolute: 0.5 10*3/uL (ref 0.1–1.0)
Monocytes Relative: 10 %
Neutro Abs: 2.4 10*3/uL (ref 1.7–7.7)
Neutrophils Relative %: 48 %
Platelet Count: 141 10*3/uL — ABNORMAL LOW (ref 150–400)
RBC: 5.18 MIL/uL — ABNORMAL HIGH (ref 3.87–5.11)
RDW: 15.5 % (ref 11.5–15.5)
WBC Count: 5 10*3/uL (ref 4.0–10.5)
nRBC: 0 % (ref 0.0–0.2)

## 2023-03-03 LAB — CMP (CANCER CENTER ONLY)
ALT: 294 U/L (ref 0–44)
AST: 185 U/L (ref 15–41)
Albumin: 3.8 g/dL (ref 3.5–5.0)
Alkaline Phosphatase: 108 U/L (ref 38–126)
Anion gap: 7 (ref 5–15)
BUN: 11 mg/dL (ref 8–23)
CO2: 31 mmol/L (ref 22–32)
Calcium: 9.6 mg/dL (ref 8.9–10.3)
Chloride: 103 mmol/L (ref 98–111)
Creatinine: 0.76 mg/dL (ref 0.44–1.00)
GFR, Estimated: >60 mL/min (ref >60)
Glucose, Bld: 127 mg/dL — ABNORMAL HIGH (ref 70–99)
Potassium: 3.8 mmol/L (ref 3.5–5.1)
Sodium: 141 mmol/L (ref 135–145)
Total Bilirubin: 0.9 mg/dL (ref <1.2)
Total Protein: 7.1 g/dL (ref 6.5–8.1)

## 2023-03-03 NOTE — Progress Notes (Signed)
[facility]   Telephone:([phone] [phone]) [phone]   Clinic Follow up Note   Patient Care Team: Verl Blalock as PCP - General (Family Medicine) Griselda Miner, MD as Consulting Physician (General Surgery) Malachy Mood, MD as Consulting Physician (Hematology) Dorothy Puffer, MD as Consulting Physician (Radiation Oncology) Carlean Jews, NP as Nurse Practitioner (Hematology and Oncology)  Date of Service:  03/03/2023  CHIEF COMPLAINT: f/u of breast cancer   CURRENT THERAPY:  Adjuvant tamoxifen   Oncology History   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, estrogen receptor positive (HCC) -pT1bN0M0, stage IA, ER+/PR+HER2-, G2, 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 -She underwent a right mastectomy on July 31, 2022.  I reviewed her surgical pathology findings with her in detail, it showed 6 mm invasive ductal carcinoma, grade 2, and DCIS.  Surgical margins were negative, lymph nodes were negative. -We discussed her Oncotype results, which showed recurrence score of 21.  This is considered low risk disease, adjuvant chemotherapy is not recommended. -She does not need postmastectomy radiation  -She started adjuvant anastrozole in July 2024, she stopped in August 2024 due to mood swings and depression. She started Tamoxifen 10mg  dialy in Sep 2024     Assessment and Plan    Breast Cancer Follow-up for breast cancer. Currently on tamoxifen 10 mg daily for nearly two months with mild teariness. No hot flashes or severe mood swings. Oncotype recurrence score of 21 indicates a 7% risk of metastatic recurrence with tamoxifen, and approximately 12% without. Previously managed depression with venlafaxine, discontinued to reduce medication burden. Discussed increasing tamoxifen to 20 mg daily and restarting venlafaxine if mood swings become problematic. - hold tamoxifen for now due to new  transaminitis - Restart tamoxifen 2-4 weeks post-surgery when activity level is back to normal - Consider increasing tamoxifen to 20 mg daily after full recovery - Consider restarting venlafaxine if mood swings become problematic, she knows to call me  - Schedule next follow-up in six months  Breast Reconstruction Surgery Scheduled for latissimus dorsi flap reconstruction in December. Previous issues with tissue expander due to thin skin, resolved by removal and wound revision. - Proceed with scheduled surgery - Stop tamoxifen one week before surgery - Restart tamoxifen 2-4 weeks post-surgery when activity level is back to normal  Osteopenia Mild osteopenia noted on bone density scan earlier this year. Engaging in daily walking for bone health. - Continue daily walking for bone health  Transaminitis -Today's labs showed significant elevated AST and ALT, T bilirubin is normal -Patient denies any new medication except to tamoxifen -Will hold on tamoxifen for now and will check in 6 weeks before she was started tamoxifen  Plan -Lab reviewed, due to transaminitis, will hold tamoxifen for now, -She is scheduled for a conservation surgery on March 26, 2023 -Repeat lab in 6 weeks, and restart tamoxifen if transaminitis resolves -She is willing to try low-dose tamoxifen 20 mg daily after she restarts  -Lab and follow-up in 3 months     SUMMARY OF ONCOLOGIC HISTORY: Oncology History Overview Note   Cancer Staging  Ductal carcinoma in situ (DCIS) of right breast Staging form: Breast, AJCC 8th Edition - Clinical stage from 05/14/2022: Stage 0 (cTis (DCIS), cN0, cM0, G2, ER+, PR+, HER2: Not Assessed) - Signed by Malachy Mood, MD on 05/27/2022 Stage prefix: Initial diagnosis Histologic grading system: 3 grade system     Malignant neoplasm of upper-outer quadrant of breast  in female, estrogen receptor positive (HCC)  05/14/2022 Cancer Staging   Staging form: Breast, AJCC 8th Edition -  Clinical stage from 05/14/2022: Stage 0 (cTis (DCIS), cN0, cM0, G2, ER+, PR+, HER2: Not Assessed) - Signed by Malachy Mood, MD on 05/27/2022 Stage prefix: Initial diagnosis Histologic grading system: 3 grade system   07/31/2022 Cancer Staging   Staging form: Breast, AJCC 8th Edition - Pathologic stage from 07/31/2022: Stage IA (pT1b, pN0, cM0, G2, ER+, PR+, HER2-) - Signed by Malachy Mood, MD on 08/26/2022 Stage prefix: Initial diagnosis Histologic grading system: 3 grade system   07/31/2022 Surgery   Right mastectomy: 6 mm IDC, grade 2, and DCIS.  Surgical margins were negative, lymph nodes were negative.    07/31/2022 Oncotype testing   21/7%   08/2022 -  Anti-estrogen oral therapy   Anastrozole daily      Discussed the use of AI scribe software for clinical note transcription with the patient, who gave verbal consent to proceed.  History of Present Illness   The patient, a 67 year old female with a history of breast cancer, presents for a follow-up visit. She has been on tamoxifen for just under two months and reports tolerating it well, with only mild teariness as a side effect. This is a significant improvement from her previous medication, which caused severe anxiety and uncontrolled weeping. She is currently on a low dose of 10mg  of tamoxifen.  The patient has a history of depression and was previously on venlafaxine, but she has since stopped taking it in an effort to reduce her overall medication load. She felt she was in a place where she could manage without it.  The patient also had a squamous cell cancer removed last month. She has been trying to maintain her bone density by walking daily.         All other systems were reviewed with the patient and are negative.  MEDICAL HISTORY:  Past Medical History:  Diagnosis Date   Anemia    years ago   Anxiety    Arthritis    Breast cancer (HCC)    Chronic shoulder pain    COVID    x 2 both mild cases   Depression    Diabetes  mellitus without complication (HCC)    GERD (gastroesophageal reflux disease)    History of kidney stones    Hyperlipemia    Hypertension    Insomnia    Lichen sclerosus of vulva    Migraine    Overweight    Rapid palpitations    Ventricular tachycardia (paroxysmal) (HCC)     SURGICAL HISTORY: Past Surgical History:  Procedure Laterality Date   BLADDER SURGERY     BREAST IMPLANT REMOVAL Right 09/19/2022   Procedure: REMOVAL RIGHT TISSUE EXPANDER;  Surgeon: Peggye Form, DO;  Location: [facility] OR;  Service: Plastics;  Laterality: Right;   BREAST RECONSTRUCTION WITH PLACEMENT OF TISSUE EXPANDER AND FLEX HD (ACELLULAR HYDRATED DERMIS) Right 07/31/2022   Procedure: IMMEDIATE RIGHT BREAST RECONSTRUCTION WITH PLACEMENT OF TISSUE EXPANDER AND FLEX HD (ACELLULAR HYDRATED DERMIS);  Surgeon: Peggye Form, DO;  Location: [facility];  Service: Plastics;  Laterality: Right;   CARPAL TUNNEL RELEASE     CATARACT EXTRACTION Bilateral    CHOLECYSTECTOMY     MASTECTOMY W/ SENTINEL NODE BIOPSY Right 07/31/2022   Procedure: RIGHT MASTECTOMY WITH SENTINEL LYMPH NODE BIOPSY;  Surgeon: Griselda Miner, MD;  Location: [facility];  Service: General;  Laterality: Right;  ORIF ANKLE FRACTURE Right 07/22/2018   Procedure: OPEN REDUCTION INTERNAL FIXATION (ORIF) RIGHT BIMALL ANKLE FRACTURE;  Surgeon: Terance Hart, MD;  Location: [facility];  Service: Orthopedics;  Laterality: Right;   ORIF TIBIA & FIBULA FRACTURES     TONSILLECTOMY      I have reviewed the social history and family history with the patient and they are unchanged from previous note.  ALLERGIES:  is allergic to hibiclens [chlorhexidine gluconate], prednisone, aspirin, micardis [telmisartan], amitriptyline, and loestrin [norethindrone acet-ethinyl est].  MEDICATIONS:  Current Outpatient Medications  Medication Sig Dispense Refill   acetaminophen (TYLENOL) 500 MG tablet Take 1,000 mg  by mouth as needed for moderate pain.     atorvastatin (LIPITOR) 80 MG tablet Take 1 tablet (80 mg total) by mouth daily at 6 PM. 30 tablet 0   Azelastine HCl 137 MCG/SPRAY SOLN Place 1 spray into both nostrils in the morning and at bedtime.     calcium-vitamin D (OSCAL WITH D) 500-5 MG-MCG tablet Take 1 tablet by mouth daily with breakfast.     clobetasol ointment (TEMOVATE) 0.05 % Apply 1 Application topically 2 (two) times a week.     empagliflozin (JARDIANCE) 10 MG TABS tablet Take 10 mg by mouth in the morning.     famotidine (PEPCID) 20 MG tablet Take 20 mg by mouth daily as needed for heartburn or indigestion.     hydrochlorothiazide (HYDRODIURIL) 25 MG tablet Take 25 mg by mouth daily.     meclizine (ANTIVERT) 25 MG tablet Take 2 tablets (50 mg total) by mouth 3 (three) times daily. (Patient taking differently: Take 25-50 mg by mouth 3 (three) times daily as needed for nausea or dizziness.) 30 tablet 0   meloxicam (MOBIC) 15 MG tablet Take 15 mg by mouth daily as needed (inflammation/pain.).     metFORMIN (GLUCOPHAGE-XR) 500 MG 24 hr tablet Take 500 mg by mouth at bedtime.     methocarbamol (ROBAXIN) 500 MG tablet Take 1 tablet (500 mg total) by mouth 2 (two) times daily as needed for muscle spasms. 20 tablet 0   metoprolol tartrate (LOPRESSOR) 50 MG tablet Take 50 mg by mouth 2 (two) times daily.     Multiple Vitamins-Minerals (PRESERVISION/LUTEIN PO) Take 1 tablet by mouth every evening.     Omega-3 Fatty Acids (FISH OIL) 1000 MG CAPS Take 1,000 mg by mouth every evening.     ondansetron (ZOFRAN-ODT) 4 MG disintegrating tablet Take 1 tablet (4 mg total) by mouth every 8 (eight) hours as needed for nausea or vomiting. 20 tablet 0   ramipril (ALTACE) 10 MG capsule Take 10 mg by mouth 2 (two) times daily.     tamoxifen (NOLVADEX) 10 MG tablet Take 1 tablet (10 mg total) by mouth daily. 30 tablet 2   No current facility-administered medications for this visit.    PHYSICAL  EXAMINATION: ECOG PERFORMANCE STATUS: 0 - Asymptomatic  Vitals:   03/03/23 1317  BP: (!) 103/54  Pulse: 61  Resp: 16  Temp: 97.7 F (36.5 C)  SpO2: 97%   Wt Readings from Last 3 Encounters:  03/03/23 142 lb 8 oz (64.6 kg)  02/02/23 139 lb 4 oz (63.2 kg)  01/14/23 141 lb 3.2 oz (64 kg)     GENERAL:alert, no distress and comfortable SKIN: skin color, texture, turgor are normal, no rashes or significant lesions EYES: normal, Conjunctiva are pink and non-injected, sclera clear NECK: supple, thyroid normal size, non-tender, without nodularity LYMPH:  no palpable lymphadenopathy in  the cervical, axillary  LUNGS: clear to auscultation and percussion with normal breathing effort HEART: regular rate & rhythm and no murmurs and no lower extremity edema ABDOMEN:abdomen soft, non-tender and normal bowel sounds Musculoskeletal:no cyanosis of digits and no clubbing  NEURO: alert & oriented x 3 with fluent speech, no focal motor/sensory deficits BREAST: Status post right mastectomy, previous incision site well-healed, not tender.  No palpable mass, left breast exam unremarkable. SKIN: Squamous cell carcinoma removed from an unspecified location last month.      LABORATORY DATA:  I have reviewed the data as listed    Latest Ref Rng & Units 03/03/2023   12:53 PM 09/19/2022    1:45 PM 05/28/2022   11:49 AM  CBC  WBC 4.0 - 10.5 K/uL 5.0  5.6  7.1   Hemoglobin 12.0 - 15.0 g/dL 10.2  72.5  36.6   Hematocrit 36.0 - 46.0 % 47.0  44.0  48.3   Platelets 150 - 400 K/uL 141  254  195         Latest Ref Rng & Units 03/03/2023   12:53 PM 09/19/2022    1:45 PM 07/28/2022    2:30 PM  CMP  Glucose 70 - 99 mg/dL 440  347  425   BUN 8 - 23 mg/dL 11  10  11    Creatinine 0.44 - 1.00 mg/dL 9.56  3.87  5.64   Sodium 135 - 145 mmol/L 141  132  137   Potassium 3.5 - 5.1 mmol/L 3.8  3.6  3.4   Chloride 98 - 111 mmol/L 103  99  100   CO2 22 - 32 mmol/L 31  22  25    Calcium 8.9 - 10.3 mg/dL 9.6  8.8  9.3    Total Protein 6.5 - 8.1 g/dL 7.1     Total Bilirubin <1.2 mg/dL 0.9     Alkaline Phos 38 - 126 U/L 108     AST 15 - 41 U/L 185     ALT 0 - 44 U/L 294         RADIOGRAPHIC STUDIES: I have personally reviewed the radiological images as listed and agreed with the findings in the report. No results found.    Orders Placed This Encounter  Procedures   MM Digital Screening Unilat L    Standing Status:   Future    Standing Expiration Date:   03/02/2024    Order Specific Question:   Reason for Exam (SYMPTOM  OR DIAGNOSIS REQUIRED)    Answer:   SCREENING    Order Specific Question:   Preferred imaging location?    Answer:   External   All questions were answered. The patient knows to call the clinic with any problems, questions or concerns. No barriers to learning was detected. The total time spent in the appointment was 30 minutes.     Malachy Mood, MD 03/03/2023

## 2023-03-03 NOTE — Assessment & Plan Note (Addendum)
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 -She underwent a right mastectomy on July 31, 2022.  I reviewed her surgical pathology findings with her in detail, it showed 6 mm invasive ductal carcinoma, grade 2, and DCIS.  Surgical margins were negative, lymph nodes were negative. -We discussed her Oncotype results, which showed recurrence score of 21.  This is considered low risk disease, adjuvant chemotherapy is not recommended. -She does not need postmastectomy radiation  -She started adjuvant anastrozole in July 2024, she stopped in August 2024 due to mood swings and depression. She started Tamoxifen 10mg  dialy in Sep 2024

## 2023-03-03 NOTE — Telephone Encounter (Signed)
CRITICAL VALUE STICKER  CRITICAL VALUE: ALT 294 AST 185  RECEIVER (on-site recipient of call): Sharlette Dense CMa  DATE & TIME NOTIFIED: 1353  MESSENGER (representative from lab): Pam in Lab  MD NOTIFIED: Dr. Mosetta Putt  TIME OF NOTIFICATION: 1353  RESPONSE: Made physician aware

## 2023-03-04 ENCOUNTER — Encounter: Payer: Self-pay | Admitting: Surgical

## 2023-03-04 ENCOUNTER — Telehealth: Payer: Self-pay

## 2023-03-04 ENCOUNTER — Ambulatory Visit: Payer: Medicare HMO | Admitting: Surgical

## 2023-03-04 VITALS — BP 159/84 | HR 71

## 2023-03-04 DIAGNOSIS — Z9889 Other specified postprocedural states: Secondary | ICD-10-CM

## 2023-03-04 DIAGNOSIS — N6489 Other specified disorders of breast: Secondary | ICD-10-CM | POA: Diagnosis not present

## 2023-03-04 DIAGNOSIS — C50411 Malignant neoplasm of upper-outer quadrant of right female breast: Secondary | ICD-10-CM

## 2023-03-04 DIAGNOSIS — Z170 Estrogen receptor positive status [ER+]: Secondary | ICD-10-CM

## 2023-03-04 MED ORDER — OXYCODONE HCL 5 MG PO TABS: 5.0000 mg | ORAL_TABLET | Freq: Four times a day (QID) | ORAL | 0 refills | Status: AC | PRN

## 2023-03-04 MED ORDER — CEPHALEXIN 500 MG PO CAPS: 500.0000 mg | ORAL_CAPSULE | Freq: Four times a day (QID) | ORAL | 0 refills | Status: AC

## 2023-03-04 MED ORDER — ONDANSETRON HCL 4 MG PO TABS: 4.0000 mg | ORAL_TABLET | Freq: Three times a day (TID) | ORAL | 0 refills | Status: AC | PRN

## 2023-03-04 NOTE — Telephone Encounter (Signed)
Called patient with lab results her AST and ALT were both elevated. Made Dr. Mosetta Putt aware she sent the message below,    Please let pt know the result and ask her if she has started any new meds lately. if no, let her stop tamoxifen now and repeat lab in 6 weeks before she restart Tamoxifen   Malachy Mood  Patient voiced full understanding. She has not started any new medications so she will be stopping the Tamoxifen until her next lab.

## 2023-03-04 NOTE — Progress Notes (Signed)
Patient ID: Meghan Mcdonald, female    DOB: [birth year], 67 y.o.   MRN: [id number]  Chief Complaint  Patient presents with   Pre-op Exam      ICD-10-CM   1. Postoperative breast asymmetry  N64.89     2. Malignant neoplasm of upper-outer quadrant of right breast in female, estrogen receptor positive (HCC)  C50.411    Z17.0     3. S/P breast reconstruction  Z98.890      History of Present Illness: Meghan Mcdonald is a 67 y.o.  female  with a history of mastectomy and right breast reconstruction with subsequent wound requiring expander removal..  She presents for preoperative evaluation for upcoming procedure, right latissimus muscle flap with expander placement, scheduled for 03/26/2023 with Dr. Ulice Bold.  The patient has not had problems with anesthesia. No history of DVT/PE.  No family or personal history of bleeding or clotting disorders.  Patient is not currently taking any blood thinners.  No history of CVA/MI.   Patient reports her sister has a history of DVTs, reports she was very ill, reports she developed the DVTs due to sedentary lifestyle related to chronic illnesses.  No other known family history of DVT/PE.  Job: Retired  [employer] Significant for: Right mastectomy with breast reconstruction with postoperative wound requiring removal of right breast tissue expander.  History of diabetes, GERD, hyperlipidemia, hypertension.  Patient is currently on tamoxifen, reports she recently had lab work completed that showed elevated liver enzymes.  She is discussing possible explanation for this with her oncologist.  She is aware to stop tamoxifen prior to and hold after surgery.  She also takes some supplements such as fish oil, multivitamin, vitamin D.  Other than the increased liver enzymes, she reports no changes to her health and has been feeling well.  She reports she is tolerated anesthesia well in the past.  She does have some questions about the scheduled procedure.  She does not  have any history of radiation.   Past Medical History: Allergies: Allergies  Allergen Reactions   Hibiclens [Chlorhexidine Gluconate] Rash    Skin blisters   Prednisone Rash    Patient states turns red all over    Aspirin Other (See Comments)    Severe Blood Thinning   Micardis [Telmisartan] Other (See Comments)    unknown   Amitriptyline Other (See Comments)    oversedation   Loestrin [Norethindrone Acet-Ethinyl Est] Rash    Current Medications:  Current Outpatient Medications:    acetaminophen (TYLENOL) 500 MG tablet, Take 1,000 mg by mouth as needed for moderate pain., Disp: , Rfl:    atorvastatin (LIPITOR) 80 MG tablet, Take 1 tablet (80 mg total) by mouth daily at 6 PM., Disp: 30 tablet, Rfl: 0   Azelastine HCl 137 MCG/SPRAY SOLN, Place 1 spray into both nostrils in the morning and at bedtime., Disp: , Rfl:    calcium-vitamin D (OSCAL WITH D) 500-5 MG-MCG tablet, Take 1 tablet by mouth daily with breakfast., Disp: , Rfl:    cephALEXin (KEFLEX) 500 MG capsule, Take 1 capsule (500 mg total) by mouth 4 (four) times daily for 5 days., Disp: 20 capsule, Rfl: 0   clobetasol ointment (TEMOVATE) 0.05 %, Apply 1 Application topically 2 (two) times a week., Disp: , Rfl:    famotidine (PEPCID) 20 MG tablet, Take 20 mg by mouth daily as needed for heartburn or indigestion., Disp: , Rfl:    hydrochlorothiazide (HYDRODIURIL) 25 MG tablet, Take 25 mg by mouth  daily., Disp: , Rfl:    meclizine (ANTIVERT) 25 MG tablet, Take 2 tablets (50 mg total) by mouth 3 (three) times daily. (Patient taking differently: Take 25-50 mg by mouth 3 (three) times daily as needed for nausea or dizziness.), Disp: 30 tablet, Rfl: 0   meloxicam (MOBIC) 15 MG tablet, Take 15 mg by mouth daily as needed (inflammation/pain.)., Disp: , Rfl:    metFORMIN (GLUCOPHAGE-XR) 500 MG 24 hr tablet, Take 1,000 mg by mouth at bedtime., Disp: , Rfl:    methocarbamol (ROBAXIN) 500 MG tablet, Take 1 tablet (500 mg total) by mouth 2  (two) times daily as needed for muscle spasms., Disp: 20 tablet, Rfl: 0   metoprolol tartrate (LOPRESSOR) 50 MG tablet, Take 50 mg by mouth 2 (two) times daily., Disp: , Rfl:    Multiple Vitamins-Minerals (PRESERVISION/LUTEIN PO), Take 1 tablet by mouth every evening., Disp: , Rfl:    Omega-3 Fatty Acids (FISH OIL) 1000 MG CAPS, Take 1,000 mg by mouth every evening., Disp: , Rfl:    ondansetron (ZOFRAN) 4 MG tablet, Take 1 tablet (4 mg total) by mouth every 8 (eight) hours as needed for nausea or vomiting., Disp: 20 tablet, Rfl: 0   ondansetron (ZOFRAN-ODT) 4 MG disintegrating tablet, Take 1 tablet (4 mg total) by mouth every 8 (eight) hours as needed for nausea or vomiting., Disp: 20 tablet, Rfl: 0   oxyCODONE (OXY IR/ROXICODONE) 5 MG immediate release tablet, Take 1 tablet (5 mg total) by mouth every 6 (six) hours as needed for up to 5 days for severe pain (pain score 7-10)., Disp: 20 tablet, Rfl: 0   ramipril (ALTACE) 10 MG capsule, Take 10 mg by mouth 2 (two) times daily., Disp: , Rfl:   Past Medical Problems: Past Medical History:  Diagnosis Date   Anemia    years ago   Anxiety    Arthritis    Breast cancer (HCC)    Chronic shoulder pain    COVID    x 2 both mild cases   Depression    Diabetes mellitus without complication (HCC)    GERD (gastroesophageal reflux disease)    History of kidney stones    Hyperlipemia    Hypertension    Insomnia    Lichen sclerosus of vulva    Migraine    Overweight    Rapid palpitations    Ventricular tachycardia (paroxysmal) (HCC)     Past Surgical History: Past Surgical History:  Procedure Laterality Date   BLADDER SURGERY     BREAST IMPLANT REMOVAL Right 09/19/2022   Procedure: REMOVAL RIGHT TISSUE EXPANDER;  Surgeon: Peggye Form, DO;  Location: [facility];  Service: Plastics;  Laterality: Right;   BREAST RECONSTRUCTION WITH PLACEMENT OF TISSUE EXPANDER AND FLEX HD (ACELLULAR HYDRATED DERMIS) Right 07/31/2022   Procedure: IMMEDIATE  RIGHT BREAST RECONSTRUCTION WITH PLACEMENT OF TISSUE EXPANDER AND FLEX HD (ACELLULAR HYDRATED DERMIS);  Surgeon: Peggye Form, DO;  Location: [facility];  Service: Plastics;  Laterality: Right;   CARPAL TUNNEL RELEASE     CATARACT EXTRACTION Bilateral    CHOLECYSTECTOMY     MASTECTOMY W/ SENTINEL NODE BIOPSY Right 07/31/2022   Procedure: RIGHT MASTECTOMY WITH SENTINEL LYMPH NODE BIOPSY;  Surgeon: Griselda Miner, MD;  Location: [facility];  Service: General;  Laterality: Right;   ORIF ANKLE FRACTURE Right 07/22/2018   Procedure: OPEN REDUCTION INTERNAL FIXATION (ORIF) RIGHT BIMALL ANKLE FRACTURE;  Surgeon: Terance Hart, MD;  Location: [facility];  Service: Orthopedics;  Laterality: Right;   ORIF TIBIA & FIBULA FRACTURES     TONSILLECTOMY      Social History: Social History   Socioeconomic History   Marital status: Married    Spouse name: Not on file   Number of children: 2   Years of education: Not on file   Highest education level: Not on file  Occupational History   Not on file  Tobacco Use   Smoking status: Never    Passive exposure: Past   Smokeless tobacco: Never  Vaping Use   Vaping status: Never Used  Substance and Sexual Activity   Alcohol use: Yes    Alcohol/week: 3.0 standard drinks of alcohol    Types: 3 Glasses of wine per week    Comment: weekly   Drug use: Never   Sexual activity: Not on file  Other Topics Concern   Not on file  Social History Narrative   ** Merged History Encounter **        Lives with husband    Social Determinants of Health   Financial Resource Strain: Not on file  Food Insecurity: Not on file  Transportation Needs: Not on file  Physical Activity: Not on file  Stress: Not on file  Social Connections: Not on file  Intimate Partner Violence: Not on file    Family History: Family History  Problem Relation Age of Onset   Hypertension Mother    Hypertension Father      Review of Systems: Review of Systems  Constitutional: Negative.   Respiratory: Negative.    Cardiovascular: Negative.   Gastrointestinal: Negative.   Neurological: Negative.     Physical Exam: Vital Signs BP (!) 159/84 (BP Location: Left Arm, Patient Position: Sitting, Cuff Size: Normal)   Pulse 71   SpO2 99%   Physical Exam Constitutional:      General: Not in acute distress.    Appearance: Normal appearance. Not ill-appearing.  HENT:     Head: Normocephalic and atraumatic.  Eyes:     Pupils: Pupils are equal, round Neck:     Musculoskeletal: Normal range of motion.  Cardiovascular:     Rate and Rhythm: Normal rate    Pulses: Normal pulses.  Pulmonary:     Effort: Pulmonary effort is normal. No respiratory distress.  Abdominal:     General: Abdomen is flat. There is no distension.  Musculoskeletal: Normal range of motion.  Skin:    General: Skin is warm and dry.     Findings: No erythema or rash.  Neurological:     General: No focal deficit present.     Mental Status: Alert and oriented to person, place, and time. Mental status is at baseline.     Motor: No weakness.  Psychiatric:        Mood and Affect: Mood normal.        Behavior: Behavior normal.    Assessment/Plan: The patient is scheduled for Right latissimus muscle flap with expander placement with Dr. Ulice Bold.  Risks, benefits, and alternatives of procedure discussed, questions answered and consent obtained.    Smoking Status: Non-smoker; Counseling Given?  N/A Last Mammogram: Status post right mastectomy  Caprini Score: 10; Risk Factors include: Age, BMI greater than 25, history of breast cancer, sister with history of DVT (provoked by chronic illness and sedentary lifestyle), and length of planned surgery. Recommendation for mechanical and possible pharmacological prophylaxis. Encourage early ambulation.   Pictures obtained: Pictures previously obtained.  Post-op Rx sent to pharmacy:  Oxycodone, Zofran Keflex  Patient was provided with the breast reconstruction/tissue expander and General Surgical Risk consent document and Pain Medication Agreement prior to their appointment.  They had adequate time to read through the risk consent documents and Pain Medication Agreement. We also discussed them in person together during this preop appointment. All of their questions were answered to their satisfaction.  Recommended calling if they have any further questions.  Risk consent form and Pain Medication Agreement to be scanned into patient's chart.  The risks that can be encountered with and after placement of a breast expander placement were discussed and include the following but not limited to these: bleeding, infection, delayed healing, anesthesia risks, skin sensation changes, injury to structures including nerves, blood vessels, and muscles which may be temporary or permanent, allergies to tape, suture materials and glues, blood products, topical preparations or injected agents, skin contour irregularities, skin discoloration and swelling, deep vein thrombosis, cardiac and pulmonary complications, pain, which may persist, fluid accumulation, wrinkling of the skin over the expander, changes in nipple or breast sensation, expander leakage or rupture, faulty position of the expander, persistent pain, formation of tight scar tissue around the expander (capsular contracture), possible need for revisional surgery or staged procedures.  We discussed the risks related to a latissimus myocutaneous muscle flap including failure of the flap with need for additional procedures related to necrosis of the graft, sensory changes, subcutaneous fluid collections, need for multiple drains, possible physical deficits.  All of her questions were answered to her content related to scheduled procedure.  Recommend holding tamoxifen 2 weeks prior to and 2 weeks after surgery.  She is going to discuss this  further with oncology, she is currently discussing elevated LFTs with oncology at this time.  Recommend holding NSAIDs 7 days prior to procedure. Recommend holding fish oil, multivitamin and any other OTC supplements 2 weeks prior to procedure.   Electronically signed by: Kermit Balo Neville Pauls, PA-C 03/04/2023 10:21 AM

## 2023-03-04 NOTE — H&P (View-Only) (Signed)
 Patient ID: Meghan Mcdonald, female    DOB: [birth year], 67 y.o.   MRN: [id number]  Chief Complaint  Patient presents with   Pre-op Exam      ICD-10-CM   1. Postoperative breast asymmetry  N64.89     2. Malignant neoplasm of upper-outer quadrant of right breast in female, estrogen receptor positive (HCC)  C50.411    Z17.0     3. S/P breast reconstruction  Z98.890      History of Present Illness: Meghan Mcdonald is a 67 y.o.  female  with a history of mastectomy and right breast reconstruction with subsequent wound requiring expander removal..  She presents for preoperative evaluation for upcoming procedure, right latissimus muscle flap with expander placement, scheduled for 03/26/2023 with Dr. Ulice Bold.  The patient has not had problems with anesthesia. No history of DVT/PE.  No family or personal history of bleeding or clotting disorders.  Patient is not currently taking any blood thinners.  No history of CVA/MI.   Patient reports her sister has a history of DVTs, reports she was very ill, reports she developed the DVTs due to sedentary lifestyle related to chronic illnesses.  No other known family history of DVT/PE.  Job: Retired  [employer] Significant for: Right mastectomy with breast reconstruction with postoperative wound requiring removal of right breast tissue expander.  History of diabetes, GERD, hyperlipidemia, hypertension.  Patient is currently on tamoxifen, reports she recently had lab work completed that showed elevated liver enzymes.  She is discussing possible explanation for this with her oncologist.  She is aware to stop tamoxifen prior to and hold after surgery.  She also takes some supplements such as fish oil, multivitamin, vitamin D.  Other than the increased liver enzymes, she reports no changes to her health and has been feeling well.  She reports she is tolerated anesthesia well in the past.  She does have some questions about the scheduled procedure.  She does not  have any history of radiation.   Past Medical History: Allergies: Allergies  Allergen Reactions   Hibiclens [Chlorhexidine Gluconate] Rash    Skin blisters   Prednisone Rash    Patient states turns red all over    Aspirin Other (See Comments)    Severe Blood Thinning   Micardis [Telmisartan] Other (See Comments)    unknown   Amitriptyline Other (See Comments)    oversedation   Loestrin [Norethindrone Acet-Ethinyl Est] Rash    Current Medications:  Current Outpatient Medications:    acetaminophen (TYLENOL) 500 MG tablet, Take 1,000 mg by mouth as needed for moderate pain., Disp: , Rfl:    atorvastatin (LIPITOR) 80 MG tablet, Take 1 tablet (80 mg total) by mouth daily at 6 PM., Disp: 30 tablet, Rfl: 0   Azelastine HCl 137 MCG/SPRAY SOLN, Place 1 spray into both nostrils in the morning and at bedtime., Disp: , Rfl:    calcium-vitamin D (OSCAL WITH D) 500-5 MG-MCG tablet, Take 1 tablet by mouth daily with breakfast., Disp: , Rfl:    cephALEXin (KEFLEX) 500 MG capsule, Take 1 capsule (500 mg total) by mouth 4 (four) times daily for 5 days., Disp: 20 capsule, Rfl: 0   clobetasol ointment (TEMOVATE) 0.05 %, Apply 1 Application topically 2 (two) times a week., Disp: , Rfl:    famotidine (PEPCID) 20 MG tablet, Take 20 mg by mouth daily as needed for heartburn or indigestion., Disp: , Rfl:    hydrochlorothiazide (HYDRODIURIL) 25 MG tablet, Take 25 mg by mouth  daily., Disp: , Rfl:    meclizine (ANTIVERT) 25 MG tablet, Take 2 tablets (50 mg total) by mouth 3 (three) times daily. (Patient taking differently: Take 25-50 mg by mouth 3 (three) times daily as needed for nausea or dizziness.), Disp: 30 tablet, Rfl: 0   meloxicam (MOBIC) 15 MG tablet, Take 15 mg by mouth daily as needed (inflammation/pain.)., Disp: , Rfl:    metFORMIN (GLUCOPHAGE-XR) 500 MG 24 hr tablet, Take 1,000 mg by mouth at bedtime., Disp: , Rfl:    methocarbamol (ROBAXIN) 500 MG tablet, Take 1 tablet (500 mg total) by mouth 2  (two) times daily as needed for muscle spasms., Disp: 20 tablet, Rfl: 0   metoprolol tartrate (LOPRESSOR) 50 MG tablet, Take 50 mg by mouth 2 (two) times daily., Disp: , Rfl:    Multiple Vitamins-Minerals (PRESERVISION/LUTEIN PO), Take 1 tablet by mouth every evening., Disp: , Rfl:    Omega-3 Fatty Acids (FISH OIL) 1000 MG CAPS, Take 1,000 mg by mouth every evening., Disp: , Rfl:    ondansetron (ZOFRAN) 4 MG tablet, Take 1 tablet (4 mg total) by mouth every 8 (eight) hours as needed for nausea or vomiting., Disp: 20 tablet, Rfl: 0   ondansetron (ZOFRAN-ODT) 4 MG disintegrating tablet, Take 1 tablet (4 mg total) by mouth every 8 (eight) hours as needed for nausea or vomiting., Disp: 20 tablet, Rfl: 0   oxyCODONE (OXY IR/ROXICODONE) 5 MG immediate release tablet, Take 1 tablet (5 mg total) by mouth every 6 (six) hours as needed for up to 5 days for severe pain (pain score 7-10)., Disp: 20 tablet, Rfl: 0   ramipril (ALTACE) 10 MG capsule, Take 10 mg by mouth 2 (two) times daily., Disp: , Rfl:   Past Medical Problems: Past Medical History:  Diagnosis Date   Anemia    years ago   Anxiety    Arthritis    Breast cancer (HCC)    Chronic shoulder pain    COVID    x 2 both mild cases   Depression    Diabetes mellitus without complication (HCC)    GERD (gastroesophageal reflux disease)    History of kidney stones    Hyperlipemia    Hypertension    Insomnia    Lichen sclerosus of vulva    Migraine    Overweight    Rapid palpitations    Ventricular tachycardia (paroxysmal) (HCC)     Past Surgical History: Past Surgical History:  Procedure Laterality Date   BLADDER SURGERY     BREAST IMPLANT REMOVAL Right 09/19/2022   Procedure: REMOVAL RIGHT TISSUE EXPANDER;  Surgeon: Peggye Form, DO;  Location: [facility];  Service: Plastics;  Laterality: Right;   BREAST RECONSTRUCTION WITH PLACEMENT OF TISSUE EXPANDER AND FLEX HD (ACELLULAR HYDRATED DERMIS) Right 07/31/2022   Procedure: IMMEDIATE  RIGHT BREAST RECONSTRUCTION WITH PLACEMENT OF TISSUE EXPANDER AND FLEX HD (ACELLULAR HYDRATED DERMIS);  Surgeon: Peggye Form, DO;  Location: [facility];  Service: Plastics;  Laterality: Right;   CARPAL TUNNEL RELEASE     CATARACT EXTRACTION Bilateral    CHOLECYSTECTOMY     MASTECTOMY W/ SENTINEL NODE BIOPSY Right 07/31/2022   Procedure: RIGHT MASTECTOMY WITH SENTINEL LYMPH NODE BIOPSY;  Surgeon: Griselda Miner, MD;  Location: [facility];  Service: General;  Laterality: Right;   ORIF ANKLE FRACTURE Right 07/22/2018   Procedure: OPEN REDUCTION INTERNAL FIXATION (ORIF) RIGHT BIMALL ANKLE FRACTURE;  Surgeon: Terance Hart, MD;  Location: [facility];  Service: Orthopedics;  Laterality: Right;   ORIF TIBIA & FIBULA FRACTURES     TONSILLECTOMY      Social History: Social History   Socioeconomic History   Marital status: Married    Spouse name: Not on file   Number of children: 2   Years of education: Not on file   Highest education level: Not on file  Occupational History   Not on file  Tobacco Use   Smoking status: Never    Passive exposure: Past   Smokeless tobacco: Never  Vaping Use   Vaping status: Never Used  Substance and Sexual Activity   Alcohol use: Yes    Alcohol/week: 3.0 standard drinks of alcohol    Types: 3 Glasses of wine per week    Comment: weekly   Drug use: Never   Sexual activity: Not on file  Other Topics Concern   Not on file  Social History Narrative   ** Merged History Encounter **        Lives with husband    Social Determinants of Health   Financial Resource Strain: Not on file  Food Insecurity: Not on file  Transportation Needs: Not on file  Physical Activity: Not on file  Stress: Not on file  Social Connections: Not on file  Intimate Partner Violence: Not on file    Family History: Family History  Problem Relation Age of Onset   Hypertension Mother    Hypertension Father      Review of Systems: Review of Systems  Constitutional: Negative.   Respiratory: Negative.    Cardiovascular: Negative.   Gastrointestinal: Negative.   Neurological: Negative.     Physical Exam: Vital Signs BP (!) 159/84 (BP Location: Left Arm, Patient Position: Sitting, Cuff Size: Normal)   Pulse 71   SpO2 99%   Physical Exam Constitutional:      General: Not in acute distress.    Appearance: Normal appearance. Not ill-appearing.  HENT:     Head: Normocephalic and atraumatic.  Eyes:     Pupils: Pupils are equal, round Neck:     Musculoskeletal: Normal range of motion.  Cardiovascular:     Rate and Rhythm: Normal rate    Pulses: Normal pulses.  Pulmonary:     Effort: Pulmonary effort is normal. No respiratory distress.  Abdominal:     General: Abdomen is flat. There is no distension.  Musculoskeletal: Normal range of motion.  Skin:    General: Skin is warm and dry.     Findings: No erythema or rash.  Neurological:     General: No focal deficit present.     Mental Status: Alert and oriented to person, place, and time. Mental status is at baseline.     Motor: No weakness.  Psychiatric:        Mood and Affect: Mood normal.        Behavior: Behavior normal.    Assessment/Plan: The patient is scheduled for Right latissimus muscle flap with expander placement with Dr. Ulice Bold.  Risks, benefits, and alternatives of procedure discussed, questions answered and consent obtained.    Smoking Status: Non-smoker; Counseling Given?  N/A Last Mammogram: Status post right mastectomy  Caprini Score: 10; Risk Factors include: Age, BMI greater than 25, history of breast cancer, sister with history of DVT (provoked by chronic illness and sedentary lifestyle), and length of planned surgery. Recommendation for mechanical and possible pharmacological prophylaxis. Encourage early ambulation.   Pictures obtained: Pictures previously obtained.  Post-op Rx sent to pharmacy:  Oxycodone, Zofran Keflex  Patient was provided with the breast reconstruction/tissue expander and General Surgical Risk consent document and Pain Medication Agreement prior to their appointment.  They had adequate time to read through the risk consent documents and Pain Medication Agreement. We also discussed them in person together during this preop appointment. All of their questions were answered to their satisfaction.  Recommended calling if they have any further questions.  Risk consent form and Pain Medication Agreement to be scanned into patient's chart.  The risks that can be encountered with and after placement of a breast expander placement were discussed and include the following but not limited to these: bleeding, infection, delayed healing, anesthesia risks, skin sensation changes, injury to structures including nerves, blood vessels, and muscles which may be temporary or permanent, allergies to tape, suture materials and glues, blood products, topical preparations or injected agents, skin contour irregularities, skin discoloration and swelling, deep vein thrombosis, cardiac and pulmonary complications, pain, which may persist, fluid accumulation, wrinkling of the skin over the expander, changes in nipple or breast sensation, expander leakage or rupture, faulty position of the expander, persistent pain, formation of tight scar tissue around the expander (capsular contracture), possible need for revisional surgery or staged procedures.  We discussed the risks related to a latissimus myocutaneous muscle flap including failure of the flap with need for additional procedures related to necrosis of the graft, sensory changes, subcutaneous fluid collections, need for multiple drains, possible physical deficits.  All of her questions were answered to her content related to scheduled procedure.  Recommend holding tamoxifen 2 weeks prior to and 2 weeks after surgery.  She is going to discuss this  further with oncology, she is currently discussing elevated LFTs with oncology at this time.  Recommend holding NSAIDs 7 days prior to procedure. Recommend holding fish oil, multivitamin and any other OTC supplements 2 weeks prior to procedure.   Electronically signed by: Kermit Balo Neville Pauls, PA-C 03/04/2023 10:21 AM

## 2023-03-05 ENCOUNTER — Telehealth: Payer: Self-pay | Admitting: Hematology

## 2023-03-05 DIAGNOSIS — C50411 Malignant neoplasm of upper-outer quadrant of right female breast: Secondary | ICD-10-CM | POA: Diagnosis not present

## 2023-03-09 DIAGNOSIS — E119 Type 2 diabetes mellitus without complications: Secondary | ICD-10-CM | POA: Diagnosis not present

## 2023-03-11 ENCOUNTER — Telehealth: Payer: Self-pay | Admitting: Plastic Surgery

## 2023-03-11 NOTE — Telephone Encounter (Signed)
Called 03-11-23 spoke with pt apt was moved from 23 to 24, provider out of office on the 23

## 2023-03-23 NOTE — Progress Notes (Addendum)
Surgical Instructions    Your procedure is scheduled on March 26, 2023.  Report to [facility] Main Entrance "A" at 5:30 A.M., then check in with the Admitting office.  Call this number if you have problems the morning of surgery:  [phone]  If you have any questions prior to your surgery date call [phone]: Open Monday-Friday 8am-4pm If you experience any cold or flu symptoms such as cough, fever, chills, shortness of breath, etc. between now and your scheduled surgery, please notify us at the above number.     Remember:  Do not eat or drink after midnight the night before your surgery      Take these medicines the morning of surgery with A SIP OF WATER  metoprolol tartrate (LOPRESSOR)    IF NEEDED acetaminophen (TYLENOL)  ondansetron (ZOFRAN)  oxyCODONE (OXY IR/ROXICODONE)   As of today, STOP taking any Aspirin (unless otherwise instructed by your surgeon) Aleve, Naproxen, Ibuprofen, Motrin, Advil, Goody's, BC's, all herbal medications, fish oil, and all vitamins. THIS INCLUDES YOUR meloxicam (MOBIC) and  Omega-3 Fatty Acids (FISH OIL).                     WHAT DO I DO ABOUT MY DIABETES MEDICATION?   Do not take oral diabetes medicines (pills) the morning of surgery: metFORMIN (GLUCOPHAGE-XR)    The day of surgery, do not take other diabetes injectables, including Byetta (exenatide), Bydureon (exenatide ER), Victoza (liraglutide), or Trulicity (dulaglutide).  If your CBG is greater than 220 mg/dL, you may take  of your sliding scale (correction) dose of insulin.   HOW TO MANAGE YOUR DIABETES BEFORE AND AFTER SURGERY  Why is it important to control my blood sugar before and after surgery? Improving blood sugar levels before and after surgery helps healing and can limit problems. A way of improving blood sugar control is eating a healthy diet by:  Eating less sugar and carbohydrates  Increasing activity/exercise  Talking with your doctor about reaching your  blood sugar goals High blood sugars (greater than 180 mg/dL) can raise your risk of infections and slow your recovery, so you will need to focus on controlling your diabetes during the weeks before surgery. Make sure that the doctor who takes care of your diabetes knows about your planned surgery including the date and location.  How do I manage my blood sugar before surgery? Check your blood sugar at least 4 times a day, starting 2 days before surgery, to make sure that the level is not too high or low.  Check your blood sugar the morning of your surgery when you wake up and every 2 hours until you get to the Short Stay unit.  If your blood sugar is less than 70 mg/dL, you will need to treat for low blood sugar: Do not take insulin. Treat a low blood sugar (less than 70 mg/dL) with  cup of clear juice (cranberry or apple), 4 glucose tablets, OR glucose gel. Recheck blood sugar in 15 minutes after treatment (to make sure it is greater than 70 mg/dL). If your blood sugar is not greater than 70 mg/dL on recheck, call [phone] for further instructions. Report your blood sugar to the short stay nurse when you get to Short Stay.  If you are admitted to the hospital after surgery: Your blood sugar will be checked by the staff and you will probably be given insulin after surgery (instead of oral diabetes medicines) to make sure you have good blood sugar  levels. The goal for blood sugar control after surgery is 80-180 mg/dL.  Do NOT Smoke (Tobacco/Vaping) for 24 hours prior to your procedure.  If you use a CPAP at night, you may bring your mask/headgear for your overnight stay.   Contacts, glasses, piercing's, hearing aid's, dentures or partials may not be worn into surgery, please bring cases for these belongings.    For patients admitted to the hospital, discharge time will be determined by your treatment team.   Patients discharged the day of surgery will not be allowed to drive home, and  someone needs to stay with them for 24 hours.  SURGICAL WAITING ROOM VISITATION Patients having surgery or a procedure may have no more than 2 support people in the waiting area - these visitors may rotate.   Children under the age of 7 must have an adult with them who is not the patient. If the patient needs to stay at the hospital during part of their recovery, the visitor guidelines for inpatient rooms apply. Pre-op nurse will coordinate an appropriate time for 1 support person to accompany patient in pre-op.  This support person may not rotate.   Please refer to the [facility] website for the visitor guidelines for Inpatients (after your surgery is over and you are in a regular room).    Special instructions:   [facility]- Preparing For Surgery  Before surgery, you can play an important role. Because skin is not sterile, your skin needs to be as free of germs as possible. You can reduce the number of germs on your skin by washing with CHG (chlorahexidine gluconate) Soap before surgery.  CHG is an antiseptic cleaner which kills germs and bonds with the skin to continue killing germs even after washing.    Oral Hygiene is also important to reduce your risk of infection.  Remember - BRUSH YOUR TEETH THE MORNING OF SURGERY WITH YOUR REGULAR TOOTHPASTE  Please do not use if you have an allergy to CHG or antibacterial soaps. If your skin becomes reddened/irritated stop using the CHG.  Do not shave (including legs and underarms) for at least 48 hours prior to first CHG shower. It is OK to shave your face.  Please follow these instructions carefully.   Shower the NIGHT BEFORE SURGERY and the MORNING OF SURGERY  If you chose to wash your hair, wash your hair first as usual with your normal shampoo.  After you shampoo, rinse your hair and body thoroughly to remove the shampoo.  Use CHG Soap as you would any other liquid soap. You can apply CHG directly to the skin and wash gently with a  scrungie or a clean washcloth.   Apply the CHG Soap to your body ONLY FROM THE NECK DOWN.  Do not use on open wounds or open sores. Avoid contact with your eyes, ears, mouth and genitals (private parts). Wash Face and genitals (private parts)  with your normal soap.   Wash thoroughly, paying special attention to the area where your surgery will be performed.  Thoroughly rinse your body with warm water from the neck down.  DO NOT shower/wash with your normal soap after using and rinsing off the CHG Soap.  Pat yourself dry with a CLEAN TOWEL.  Wear CLEAN PAJAMAS to bed the night before surgery  Place CLEAN SHEETS on your bed the night before your surgery  DO NOT SLEEP WITH PETS.   Day of Surgery: Take a shower with CHG soap. Do not wear jewelry or  makeup Do not wear lotions, powders, perfumes/colognes, or deodorant. Do not shave 48 hours prior to surgery.  Men may shave face and neck. Do not bring valuables to the hospital.  [facility] is not responsible for any belongings or valuables. Do not wear nail polish, gel polish, artificial nails, or any other type of covering on natural nails (fingers and toes) If you have artificial nails or gel coating that need to be removed by a nail salon, please have this removed prior to surgery. Artificial nails or gel coating may interfere with anesthesia's ability to adequately monitor your vital signs. Wear Clean/Comfortable clothing the morning of surgery Remember to brush your teeth WITH YOUR REGULAR TOOTHPASTE.   Please read over the following fact sheets that you were given.    If you received a COVID test during your pre-op visit  it is requested that you wear a mask when out in public, stay away from anyone that may not be feeling well and notify your surgeon if you develop symptoms. If you have been in contact with anyone that has tested positive in the last 10 days please notify you surgeon.

## 2023-03-24 ENCOUNTER — Encounter (HOSPITAL_COMMUNITY)
Admission: RE | Admit: 2023-03-24 | Discharge: 2023-03-24 | Disposition: A | Discharge status: Home / Self Care | Payer: Medicare HMO | Source: Ambulatory Visit | Attending: Plastic Surgery | Admitting: Plastic Surgery

## 2023-03-24 ENCOUNTER — Other Ambulatory Visit: Payer: Self-pay

## 2023-03-24 ENCOUNTER — Encounter (HOSPITAL_COMMUNITY): Payer: Self-pay

## 2023-03-24 VITALS — BP 133/64 | HR 61 | Temp 98.5°F | Resp 17 | Ht 62.0 in | Wt 141.0 lb

## 2023-03-24 DIAGNOSIS — C50911 Malignant neoplasm of unspecified site of right female breast: Secondary | ICD-10-CM | POA: Insufficient documentation

## 2023-03-24 DIAGNOSIS — E119 Type 2 diabetes mellitus without complications: Secondary | ICD-10-CM | POA: Insufficient documentation

## 2023-03-24 DIAGNOSIS — Z9011 Acquired absence of right breast and nipple: Secondary | ICD-10-CM | POA: Insufficient documentation

## 2023-03-24 DIAGNOSIS — I10 Essential (primary) hypertension: Secondary | ICD-10-CM | POA: Insufficient documentation

## 2023-03-24 DIAGNOSIS — K219 Gastro-esophageal reflux disease without esophagitis: Secondary | ICD-10-CM | POA: Insufficient documentation

## 2023-03-24 DIAGNOSIS — Z01812 Encounter for preprocedural laboratory examination: Secondary | ICD-10-CM | POA: Insufficient documentation

## 2023-03-24 DIAGNOSIS — Z01818 Encounter for other preprocedural examination: Secondary | ICD-10-CM

## 2023-03-24 LAB — HEMOGLOBIN A1C
Hgb A1c MFr Bld: 6.2 % — ABNORMAL HIGH (ref 4.8–5.6)
Mean Plasma Glucose: 131.24 mg/dL

## 2023-03-24 LAB — GLUCOSE, CAPILLARY: Glucose-Capillary: 127 mg/dL — ABNORMAL HIGH (ref 70–99)

## 2023-03-24 NOTE — Progress Notes (Addendum)
Progress Notes    Addendum   Date of Service: 03/24/2023  2:00 PM    Surgical Instructions                 Your procedure is scheduled on March 26, 2023.             Report to [facility] Main Entrance "A" at 5:30 A.M., then check in with the Admitting office.             Call this number if you have problems the morning of surgery:             [phone]   If you have any questions prior to your surgery date call [phone]: Open Monday-Friday 8am-4pm If you experience any cold or flu symptoms such as cough, fever, chills, shortness of breath, etc. between now and your scheduled surgery, please notify us at the above number.                  Remember:             Do not eat or drink after midnight the night before your surgery                              Take these medicines the morning of surgery with A SIP OF WATER  metoprolol tartrate (LOPRESSOR)      IF NEEDED acetaminophen (TYLENOL)  ondansetron (ZOFRAN)  oxyCODONE (OXY IR/ROXICODONE)    As of today, STOP taking any Aspirin (unless otherwise instructed by your surgeon) Aleve, Naproxen, Ibuprofen, Motrin, Advil, Goody's, BC's, all herbal medications, fish oil, and all vitamins. THIS INCLUDES YOUR meloxicam (MOBIC) and  Omega-3 Fatty Acids (FISH OIL).                     WHAT DO I DO ABOUT MY DIABETES MEDICATION?     Do not take oral diabetes medicines (pills) the morning of surgery: metFORMIN (GLUCOPHAGE-XR)      The day of surgery, do not take other diabetes injectables, including Byetta (exenatide), Bydureon (exenatide ER), Victoza (liraglutide), or Trulicity (dulaglutide).   If your CBG is greater than 220 mg/dL, you may take  of your sliding scale (correction) dose of insulin.     HOW TO MANAGE YOUR DIABETES BEFORE AND AFTER SURGERY   Why is it important to control my blood sugar before and after surgery? Improving blood sugar levels before and after surgery helps healing and can limit problems. A way  of improving blood sugar control is eating a healthy diet by:  Eating less sugar and carbohydrates  Increasing activity/exercise  Talking with your doctor about reaching your blood sugar goals High blood sugars (greater than 180 mg/dL) can raise your risk of infections and slow your recovery, so you will need to focus on controlling your diabetes during the weeks before surgery. Make sure that the doctor who takes care of your diabetes knows about your planned surgery including the date and location.   How do I manage my blood sugar before surgery? Check your blood sugar at least 4 times a day, starting 2 days before surgery, to make sure that the level is not too high or low.   Check your blood sugar the morning of your surgery when you wake up and every 2 hours until you get to the Short Stay unit.   If your blood sugar is less than 70 mg/dL,  you will need to treat for low blood sugar: Do not take insulin. Treat a low blood sugar (less than 70 mg/dL) with  cup of clear juice (cranberry or apple), 4 glucose tablets, OR glucose gel. Recheck blood sugar in 15 minutes after treatment (to make sure it is greater than 70 mg/dL). If your blood sugar is not greater than 70 mg/dL on recheck, call [phone] for further instructions. Report your blood sugar to the short stay nurse when you get to Short Stay.   If you are admitted to the hospital after surgery: Your blood sugar will be checked by the staff and you will probably be given insulin after surgery (instead of oral diabetes medicines) to make sure you have good blood sugar levels. The goal for blood sugar control after surgery is 80-180 mg/dL.  Do NOT Smoke (Tobacco/Vaping) for 24 hours prior to your procedure.   If you use a CPAP at night, you may bring your mask/headgear for your overnight stay.   Contacts, glasses, piercing's, hearing aid's, dentures or partials may not be worn into surgery, please bring cases for these belongings.     For patients admitted to the hospital, discharge time will be determined by your treatment team.   Patients discharged the day of surgery will not be allowed to drive home, and someone needs to stay with them for 24 hours.   SURGICAL WAITING ROOM VISITATION Patients having surgery or a procedure may have no more than 2 support people in the waiting area - these visitors may rotate.   Children under the age of 35 must have an adult with them who is not the patient. If the patient needs to stay at the hospital during part of their recovery, the visitor guidelines for inpatient rooms apply. Pre-op nurse will coordinate an appropriate time for 1 support person to accompany patient in pre-op.  This support person may not rotate.    Please refer to the [facility] website for the visitor guidelines for Inpatients (after your surgery is over and you are in a regular room).      Special instructions:   [facility]- Preparing For Surgery   Before surgery, you can play an important role. Because skin is not sterile, your skin needs to be as free of germs as possible. You can reduce the number of germs on your skin by washing with Dial Soap before surgery.    Oral Hygiene is also important to reduce your risk of infection.  Remember - BRUSH YOUR TEETH THE MORNING OF SURGERY WITH YOUR REGULAR TOOTHPASTE   Do not shave (including legs and underarms) for at least 48 hours prior to first Dial shower. It is OK to shave your face.   Please follow these instructions carefully.                                                                                                                               Shower  the NIGHT BEFORE SURGERY and the MORNING OF SURGERY   If you chose to wash your hair, wash your hair first as usual with your normal shampoo.   After you shampoo, rinse your hair and body thoroughly to remove the shampoo.  4.   Wash thoroughly, paying special attention to the area where your surgery  will be performed.   5.   Thoroughly rinse your body with warm water from the neck down.   6.   Pat yourself dry with a CLEAN TOWEL.   7.   Wear CLEAN PAJAMAS to bed the night before surgery   8.   Place CLEAN SHEETS on your bed the night before your surgery   9.   DO NOT SLEEP WITH PETS.     Day of Surgery: Take a shower with Dial soap. Do not wear jewelry or makeup Do not wear lotions, powders, perfumes/colognes, or deodorant. Do not shave 48 hours prior to surgery.  Men may shave face and neck. Do not bring valuables to the hospital.  [facility] is not responsible for any belongings or valuables. Do not wear nail polish, gel polish, artificial nails, or any other type of covering on natural nails (fingers and toes) If you have artificial nails or gel coating that need to be removed by a nail salon, please have this removed prior to surgery. Artificial nails or gel coating may interfere with anesthesia's ability to adequately monitor your vital signs. Wear Clean/Comfortable clothing the morning of surgery Remember to brush your teeth WITH YOUR REGULAR TOOTHPASTE.   Please read over the following fact sheets that you were given.       If you received a COVID test during your pre-op visit  it is requested that you wear a mask when out in public, stay away from anyone that may not be feeling well and notify your surgeon if you develop symptoms. If you have been in contact with anyone that has tested positive in the last 10 days please notify you surgeon.

## 2023-03-24 NOTE — Progress Notes (Signed)
PCP - Dr. Myles Lipps Cardiologist - denies  PPM/ICD - denies Device Orders - na Rep Notified - na  Chest x-ray - na EKG - 06/09/2022 Stress Test -  ECHO - 12/23/2017 Cardiac Cath -   Sleep Study - denies CPAP - na  Type II diabetic.  Fasting Blood Sugar - 100-110 Checks Blood Sugar one times a day  Blood Thinner Instructions: denies Aspirin Instructions:denies  ERAS Protcol -NPO  COVID TEST- na  Anesthesia review: Yes. DM, HTN, stroke  Patient denies shortness of breath, fever, cough and chest pain at PAT appointment   All instructions explained to the patient, with a verbal understanding of the material. Patient agrees to go over the instructions while at home for a better understanding. Patient also instructed to self quarantine after being tested for COVID-19. The opportunity to ask questions was provided.

## 2023-03-25 NOTE — Anesthesia Preprocedure Evaluation (Addendum)
Anesthesia Evaluation  Patient identified by MRN, date of birth, ID band Patient awake    Reviewed: Allergy & Precautions, NPO status , Patient's Chart, lab work & pertinent test results, reviewed documented beta blocker date and time   History of Anesthesia Complications Negative for: history of anesthetic complications  Airway Mallampati: II  TM Distance: >3 FB Neck ROM: Full    Dental  (+) Dental Advisory Given   Pulmonary neg pulmonary ROS   Pulmonary exam normal        Cardiovascular hypertension, Pt. on medications and Pt. on home beta blockers Normal cardiovascular exam+ dysrhythmias Ventricular Tachycardia      Neuro/Psych  Headaches PSYCHIATRIC DISORDERS Anxiety Depression     Vertigo Hearing aids  CVA, No Residual Symptoms    GI/Hepatic Neg liver ROS,GERD  Controlled,,  Endo/Other  diabetes, Type 2, Oral Hypoglycemic Agents    Renal/GU negative Renal ROS     Musculoskeletal  (+) Arthritis ,    Abdominal   Peds  Hematology negative hematology ROS (+)   Anesthesia Other Findings   Reproductive/Obstetrics  Breast cancer                              Anesthesia Physical Anesthesia Plan  ASA: 3  Anesthesia Plan: General   Post-op Pain Management: Ketamine IV* and Toradol IV (intra-op)*   Induction: Intravenous  PONV Risk Score and Plan: 3 and Treatment may vary due to age or medical condition, Ondansetron and TIVA  Airway Management Planned: Oral ETT  Additional Equipment: None  Intra-op Plan:   Post-operative Plan: Extubation in OR  Informed Consent: I have reviewed the patients History and Physical, chart, labs and discussed the procedure including the risks, benefits and alternatives for the proposed anesthesia with the patient or authorized representative who has indicated his/her understanding and acceptance.     Dental advisory given  Plan Discussed with:  CRNA and Anesthesiologist  Anesthesia Plan Comments: (PAT note by Antionette Poles, PA-C)        Anesthesia Quick Evaluation

## 2023-03-25 NOTE — Progress Notes (Signed)
Anesthesia Chart Review:  67 year old female with pertinent history including HTN, GERD, non-insulin-dependent DM2 (A1c 6.21 03/24/2023), migraines, palpitations.  Follows with oncology for history of breast cancer s/p right mastectomy 07/31/22.  She did not require postmastectomy radiation.  She started adjuvant anastrozole in July 2024 but stopped in August 2024 due to side effects.  She started on tamoxifen 10 mg daily in September 2024.  Last seen by Dr. Mosetta Putt 03/03/2023.  At that time she was noted to have new transaminitis and tamoxifen was held.  On review of prior labs, patient does have mildly elevated transaminases at baseline with AST ~50 and ALT ~70.  Per note, "-Lab reviewed, due to transaminitis, will hold tamoxifen for now, -She is scheduled for a conservation surgery on March 26, 2023 -Repeat lab in 6 weeks, and restart tamoxifen if transaminitis resolves -She is willing to try low-dose tamoxifen 20 mg daily after she restarts  -Lab and follow-up in 3 months."   CBC 03/03/2023 reviewed, mild thrombocytopenia platelets 141, otherwise unremarkable.   EKG 06/09/2022: Normal sinus rhythm.  Rate 60. Low voltage QRS Septal infarct , age undetermined  TTE 12/23/2017: - Left ventricle: The cavity size was normal. Systolic function was    normal. The estimated ejection fraction was in the range of 60%    to 65%. Wall motion was normal; there were no regional wall    motion abnormalities. The study is indeterminate for the    evaluation of LV diastolic function.  - Aortic valve: There was mild regurgitation.  - Mitral valve: There was no significant regurgitation.  - Right ventricle: Systolic function was normal.  - Atrial septum: Poorly visualized. A patent foramen ovale cannot    be excluded.  - Tricuspid valve: There was trivial regurgitation.  - Pulmonic valve: There was no significant regurgitation.   Impressions:   - Normal LV systolic function without significant valve disease. No     clear source of emboli. Atrial septum not well visualized; shunt    or PFO cannot be completely excluded.     Zannie Cove [facility] Stay [facility]/Anesthesiology Phone [phone] 03/25/2023 10:08 AM

## 2023-03-26 ENCOUNTER — Inpatient Hospital Stay (HOSPITAL_COMMUNITY): Payer: Medicare HMO

## 2023-03-26 ENCOUNTER — Encounter (HOSPITAL_COMMUNITY): Payer: Self-pay | Admitting: Plastic Surgery

## 2023-03-26 ENCOUNTER — Inpatient Hospital Stay (HOSPITAL_COMMUNITY): Payer: Self-pay | Admitting: Physician Assistant

## 2023-03-26 ENCOUNTER — Other Ambulatory Visit: Payer: Self-pay

## 2023-03-26 ENCOUNTER — Inpatient Hospital Stay (HOSPITAL_COMMUNITY)
Admission: RE | Admit: 2023-03-26 | Discharge: 2023-03-29 | DRG: 584 | Disposition: A | Discharge status: Home / Self Care | Payer: Medicare HMO | Attending: Plastic Surgery | Admitting: Plastic Surgery

## 2023-03-26 ENCOUNTER — Encounter (HOSPITAL_COMMUNITY)
Admission: RE | Disposition: A | Discharge status: Home / Self Care | Payer: Self-pay | Source: Home / Self Care | Attending: Plastic Surgery

## 2023-03-26 DIAGNOSIS — Z170 Estrogen receptor positive status [ER+]: Secondary | ICD-10-CM | POA: Diagnosis not present

## 2023-03-26 DIAGNOSIS — E663 Overweight: Secondary | ICD-10-CM | POA: Diagnosis not present

## 2023-03-26 DIAGNOSIS — K5909 Other constipation: Secondary | ICD-10-CM | POA: Diagnosis present

## 2023-03-26 DIAGNOSIS — Z853 Personal history of malignant neoplasm of breast: Secondary | ICD-10-CM | POA: Diagnosis not present

## 2023-03-26 DIAGNOSIS — Z886 Allergy status to analgesic agent status: Secondary | ICD-10-CM | POA: Diagnosis not present

## 2023-03-26 DIAGNOSIS — Z79810 Long term (current) use of selective estrogen receptor modulators (SERMs): Secondary | ICD-10-CM

## 2023-03-26 DIAGNOSIS — Z888 Allergy status to other drugs, medicaments and biological substances status: Secondary | ICD-10-CM | POA: Diagnosis not present

## 2023-03-26 DIAGNOSIS — Z8249 Family history of ischemic heart disease and other diseases of the circulatory system: Secondary | ICD-10-CM | POA: Diagnosis not present

## 2023-03-26 DIAGNOSIS — D62 Acute posthemorrhagic anemia: Secondary | ICD-10-CM | POA: Diagnosis not present

## 2023-03-26 DIAGNOSIS — E119 Type 2 diabetes mellitus without complications: Secondary | ICD-10-CM | POA: Diagnosis present

## 2023-03-26 DIAGNOSIS — Z421 Encounter for breast reconstruction following mastectomy: Secondary | ICD-10-CM

## 2023-03-26 DIAGNOSIS — Z87442 Personal history of urinary calculi: Secondary | ICD-10-CM | POA: Diagnosis not present

## 2023-03-26 DIAGNOSIS — Z79899 Other long term (current) drug therapy: Secondary | ICD-10-CM | POA: Diagnosis not present

## 2023-03-26 DIAGNOSIS — Z01818 Encounter for other preprocedural examination: Secondary | ICD-10-CM

## 2023-03-26 DIAGNOSIS — E785 Hyperlipidemia, unspecified: Secondary | ICD-10-CM | POA: Diagnosis not present

## 2023-03-26 DIAGNOSIS — Z7984 Long term (current) use of oral hypoglycemic drugs: Secondary | ICD-10-CM | POA: Diagnosis not present

## 2023-03-26 DIAGNOSIS — I10 Essential (primary) hypertension: Secondary | ICD-10-CM | POA: Diagnosis not present

## 2023-03-26 DIAGNOSIS — Z9011 Acquired absence of right breast and nipple: Secondary | ICD-10-CM | POA: Diagnosis not present

## 2023-03-26 DIAGNOSIS — K219 Gastro-esophageal reflux disease without esophagitis: Secondary | ICD-10-CM | POA: Diagnosis not present

## 2023-03-26 DIAGNOSIS — M1990 Unspecified osteoarthritis, unspecified site: Secondary | ICD-10-CM | POA: Diagnosis present

## 2023-03-26 HISTORY — PX: LATISSIMUS FLAP TO BREAST: SHX5357

## 2023-03-26 LAB — GLUCOSE, CAPILLARY
Glucose-Capillary: 105 mg/dL — ABNORMAL HIGH (ref 70–99)
Glucose-Capillary: 129 mg/dL — ABNORMAL HIGH (ref 70–99)

## 2023-03-26 LAB — HIV ANTIBODY (ROUTINE TESTING W REFLEX): HIV Screen 4th Generation wRfx: NONREACTIVE

## 2023-03-26 SURGERY — RECONSTRUCTION, BREAST, USING LATISSIMUS DORSI MYOCUTANEOUS FLAP
Anesthesia: General | Site: Breast | Laterality: Right

## 2023-03-26 MED ORDER — SODIUM CHLORIDE 0.9 % IV SOLN
INTRAVENOUS | Status: DC | PRN
  Administered 2023-03-26: 27 mL

## 2023-03-26 MED ORDER — BUPIVACAINE LIPOSOME 1.3 % IJ SUSP
INTRAMUSCULAR | Status: AC
  Filled 2023-03-26: qty 20

## 2023-03-26 MED ORDER — ONDANSETRON HCL 4 MG/2ML IJ SOLN
INTRAMUSCULAR | Status: AC
  Filled 2023-03-26: qty 2

## 2023-03-26 MED ORDER — KETAMINE HCL 10 MG/ML IJ SOLN
INTRAMUSCULAR | Status: DC | PRN
  Administered 2023-03-26: 20 mg via INTRAVENOUS
  Administered 2023-03-26: 10 mg via INTRAVENOUS

## 2023-03-26 MED ORDER — CHLORHEXIDINE GLUCONATE 0.12 % MT SOLN: 15.0000 mL | Freq: Once | OROMUCOSAL | Status: AC

## 2023-03-26 MED ORDER — BUPIVACAINE-EPINEPHRINE (PF) 0.25% -1:200000 IJ SOLN
INTRAMUSCULAR | Status: AC
  Filled 2023-03-26: qty 30

## 2023-03-26 MED ORDER — FENTANYL CITRATE (PF) 100 MCG/2ML IJ SOLN
25.0000 ug | INTRAMUSCULAR | Status: DC | PRN
  Administered 2023-03-26: 50 ug via INTRAVENOUS
  Administered 2023-03-26: 25 ug via INTRAVENOUS
  Administered 2023-03-26: 50 ug via INTRAVENOUS

## 2023-03-26 MED ORDER — DIPHENHYDRAMINE HCL 12.5 MG/5ML PO ELIX: 12.5000 mg | ORAL_SOLUTION | Freq: Four times a day (QID) | ORAL | Status: DC | PRN

## 2023-03-26 MED ORDER — ROCURONIUM BROMIDE 10 MG/ML (PF) SYRINGE
PREFILLED_SYRINGE | INTRAVENOUS | Status: DC | PRN
  Administered 2023-03-26: 20 mg via INTRAVENOUS
  Administered 2023-03-26: 60 mg via INTRAVENOUS

## 2023-03-26 MED ORDER — MIDAZOLAM HCL 2 MG/2ML IJ SOLN
INTRAMUSCULAR | Status: AC
Start: 2023-03-26 — End: ?
  Filled 2023-03-26: qty 2

## 2023-03-26 MED ORDER — SUGAMMADEX SODIUM 200 MG/2ML IV SOLN
INTRAVENOUS | Status: DC | PRN
  Administered 2023-03-26: 200 mg via INTRAVENOUS

## 2023-03-26 MED ORDER — ACETAMINOPHEN 500 MG PO TABS
500.0000 mg | ORAL_TABLET | Freq: Four times a day (QID) | ORAL | Status: DC
  Administered 2023-03-26 – 2023-03-29 (×12): 500 mg via ORAL
  Filled 2023-03-26 (×12): qty 1

## 2023-03-26 MED ORDER — KCL IN DEXTROSE-NACL 20-5-0.45 MEQ/L-%-% IV SOLN
INTRAVENOUS | Status: AC
Start: 2023-03-26 — End: 2023-03-27
  Filled 2023-03-26: qty 1000

## 2023-03-26 MED ORDER — ZOLPIDEM TARTRATE 5 MG PO TABS: 5.0000 mg | ORAL_TABLET | Freq: Every evening | ORAL | Status: DC | PRN

## 2023-03-26 MED ORDER — FENTANYL CITRATE (PF) 250 MCG/5ML IJ SOLN
INTRAMUSCULAR | Status: DC | PRN
  Administered 2023-03-26 (×2): 50 ug via INTRAVENOUS

## 2023-03-26 MED ORDER — POLYETHYLENE GLYCOL 3350 17 G PO PACK
17.0000 g | PACK | Freq: Every day | ORAL | Status: DC | PRN
Start: 2023-03-26 — End: 2023-03-29

## 2023-03-26 MED ORDER — SENNA 8.6 MG PO TABS
1.0000 | ORAL_TABLET | Freq: Two times a day (BID) | ORAL | Status: DC
  Administered 2023-03-26 – 2023-03-28 (×6): 8.6 mg via ORAL
  Filled 2023-03-26 (×7): qty 1

## 2023-03-26 MED ORDER — DEXAMETHASONE SODIUM PHOSPHATE 10 MG/ML IJ SOLN
INTRAMUSCULAR | Status: AC
Start: 2023-03-26 — End: ?
  Filled 2023-03-26: qty 1

## 2023-03-26 MED ORDER — ROCURONIUM BROMIDE 10 MG/ML (PF) SYRINGE
PREFILLED_SYRINGE | INTRAVENOUS | Status: AC
  Filled 2023-03-26: qty 10

## 2023-03-26 MED ORDER — PHENYLEPHRINE 80 MCG/ML (10ML) SYRINGE FOR IV PUSH (FOR BLOOD PRESSURE SUPPORT)
PREFILLED_SYRINGE | INTRAVENOUS | Status: AC
  Filled 2023-03-26: qty 10

## 2023-03-26 MED ORDER — LIDOCAINE 2% (20 MG/ML) 5 ML SYRINGE
INTRAMUSCULAR | Status: DC | PRN
  Administered 2023-03-26: 60 mg via INTRAVENOUS

## 2023-03-26 MED ORDER — METHOCARBAMOL 1000 MG/10ML IJ SOLN: 500.0000 mg | Freq: Three times a day (TID) | INTRAMUSCULAR | Status: DC | PRN

## 2023-03-26 MED ORDER — OXYCODONE HCL 5 MG/5ML PO SOLN: 5.0000 mg | Freq: Once | ORAL | Status: AC | PRN

## 2023-03-26 MED ORDER — FENTANYL CITRATE (PF) 100 MCG/2ML IJ SOLN
INTRAMUSCULAR | Status: AC
  Filled 2023-03-26: qty 2

## 2023-03-26 MED ORDER — CEFAZOLIN SODIUM-DEXTROSE 2-4 GM/100ML-% IV SOLN
INTRAVENOUS | Status: AC
  Filled 2023-03-26: qty 100

## 2023-03-26 MED ORDER — METHOCARBAMOL 500 MG PO TABS
ORAL_TABLET | ORAL | Status: AC
  Filled 2023-03-26: qty 1

## 2023-03-26 MED ORDER — VISTASEAL 10 ML SINGLE DOSE KIT
10.0000 mL | PACK | Freq: Once | CUTANEOUS | Status: DC
  Filled 2023-03-26: qty 10

## 2023-03-26 MED ORDER — BUPIVACAINE-EPINEPHRINE 0.25% -1:200000 IJ SOLN
INTRAMUSCULAR | Status: DC | PRN
  Administered 2023-03-26: 20 mL

## 2023-03-26 MED ORDER — CEFAZOLIN SODIUM-DEXTROSE 2-4 GM/100ML-% IV SOLN
2.0000 g | Freq: Three times a day (TID) | INTRAVENOUS | Status: DC
  Administered 2023-03-26 – 2023-03-28 (×7): 2 g via INTRAVENOUS
  Filled 2023-03-26 (×7): qty 100

## 2023-03-26 MED ORDER — ONDANSETRON HCL 4 MG/2ML IJ SOLN: 4.0000 mg | Freq: Once | INTRAMUSCULAR | Status: DC | PRN

## 2023-03-26 MED ORDER — PROPOFOL 10 MG/ML IV BOLUS
INTRAVENOUS | Status: AC
  Filled 2023-03-26: qty 20

## 2023-03-26 MED ORDER — OXYCODONE HCL 5 MG PO TABS
ORAL_TABLET | ORAL | Status: AC
  Filled 2023-03-26: qty 1

## 2023-03-26 MED ORDER — HEMOSTATIC AGENTS (NO CHARGE) OPTIME
TOPICAL | Status: DC | PRN
  Administered 2023-03-26: 1 via TOPICAL

## 2023-03-26 MED ORDER — OXYCODONE HCL 5 MG PO TABS
5.0000 mg | ORAL_TABLET | ORAL | Status: DC | PRN
  Administered 2023-03-26 – 2023-03-28 (×4): 10 mg via ORAL
  Filled 2023-03-26 (×4): qty 2

## 2023-03-26 MED ORDER — FENTANYL CITRATE (PF) 250 MCG/5ML IJ SOLN
INTRAMUSCULAR | Status: AC
  Filled 2023-03-26: qty 5

## 2023-03-26 MED ORDER — DIPHENHYDRAMINE HCL 50 MG/ML IJ SOLN: 12.5000 mg | Freq: Four times a day (QID) | INTRAMUSCULAR | Status: DC | PRN

## 2023-03-26 MED ORDER — KETAMINE HCL 50 MG/5ML IJ SOSY
PREFILLED_SYRINGE | INTRAMUSCULAR | Status: AC
  Filled 2023-03-26: qty 5

## 2023-03-26 MED ORDER — LIDOCAINE 2% (20 MG/ML) 5 ML SYRINGE
INTRAMUSCULAR | Status: AC
  Filled 2023-03-26: qty 5

## 2023-03-26 MED ORDER — ONDANSETRON HCL 4 MG/2ML IJ SOLN
4.0000 mg | Freq: Four times a day (QID) | INTRAMUSCULAR | Status: DC | PRN
  Administered 2023-03-26: 4 mg via INTRAVENOUS
  Filled 2023-03-26: qty 2

## 2023-03-26 MED ORDER — CEFAZOLIN SODIUM-DEXTROSE 2-4 GM/100ML-% IV SOLN
2.0000 g | INTRAVENOUS | Status: AC
  Administered 2023-03-26: 2 g via INTRAVENOUS

## 2023-03-26 MED ORDER — OXYCODONE HCL 5 MG PO TABS
5.0000 mg | ORAL_TABLET | Freq: Once | ORAL | Status: AC | PRN
  Administered 2023-03-26: 5 mg via ORAL

## 2023-03-26 MED ORDER — BISACODYL 10 MG RE SUPP: 10.0000 mg | Freq: Every day | RECTAL | Status: DC | PRN

## 2023-03-26 MED ORDER — PHENYLEPHRINE 80 MCG/ML (10ML) SYRINGE FOR IV PUSH (FOR BLOOD PRESSURE SUPPORT)
PREFILLED_SYRINGE | INTRAVENOUS | Status: DC | PRN
  Administered 2023-03-26 (×4): 80 ug via INTRAVENOUS

## 2023-03-26 MED ORDER — FIBRIN SEALANT 5 ML SINGLE DOSE KIT
5.0000 mL | PACK | Freq: Once | CUTANEOUS | Status: DC
  Filled 2023-03-26: qty 1

## 2023-03-26 MED ORDER — TRAMADOL HCL 50 MG PO TABS: 50.0000 mg | ORAL_TABLET | Freq: Four times a day (QID) | ORAL | Status: DC | PRN

## 2023-03-26 MED ORDER — LACTATED RINGERS IV SOLN: INTRAVENOUS | Status: DC | PRN

## 2023-03-26 MED ORDER — DEXAMETHASONE SODIUM PHOSPHATE 10 MG/ML IJ SOLN
INTRAMUSCULAR | Status: DC | PRN
  Administered 2023-03-26: 5 mg via INTRAVENOUS

## 2023-03-26 MED ORDER — ONDANSETRON HCL 4 MG/2ML IJ SOLN
INTRAMUSCULAR | Status: DC | PRN
  Administered 2023-03-26: 4 mg via INTRAVENOUS

## 2023-03-26 MED ORDER — HYDROMORPHONE HCL 1 MG/ML IJ SOLN
1.0000 mg | INTRAMUSCULAR | Status: DC | PRN
  Administered 2023-03-26 – 2023-03-27 (×2): 1 mg via INTRAVENOUS
  Filled 2023-03-26 (×2): qty 1

## 2023-03-26 MED ORDER — MIDAZOLAM HCL 2 MG/2ML IJ SOLN
INTRAMUSCULAR | Status: DC | PRN
  Administered 2023-03-26 (×2): 1 mg via INTRAVENOUS

## 2023-03-26 MED ORDER — METHOCARBAMOL 500 MG PO TABS
500.0000 mg | ORAL_TABLET | Freq: Three times a day (TID) | ORAL | Status: DC | PRN
  Administered 2023-03-26 – 2023-03-29 (×6): 500 mg via ORAL
  Filled 2023-03-26 (×5): qty 1

## 2023-03-26 MED ORDER — PROPOFOL 10 MG/ML IV BOLUS
INTRAVENOUS | Status: DC | PRN
  Administered 2023-03-26: 130 mg via INTRAVENOUS

## 2023-03-26 MED ORDER — ORAL CARE MOUTH RINSE
15.0000 mL | Freq: Once | OROMUCOSAL | Status: AC
  Administered 2023-03-26: 15 mL via OROMUCOSAL

## 2023-03-26 MED ORDER — ONDANSETRON 4 MG PO TBDP: 4.0000 mg | ORAL_TABLET | Freq: Four times a day (QID) | ORAL | Status: DC | PRN

## 2023-03-26 SURGICAL SUPPLY — 68 items
APPLIER CLIP 9.375 MED OPEN (MISCELLANEOUS) ×1
BAG COUNTER SPONGE SURGICOUNT (BAG) ×2 IMPLANT
BAG DECANTER FOR FLEXI CONT (MISCELLANEOUS) ×2 IMPLANT
BINDER BREAST LRG (GAUZE/BANDAGES/DRESSINGS) IMPLANT
BINDER BREAST XLRG (GAUZE/BANDAGES/DRESSINGS) IMPLANT
BIOPATCH RED 1 DISK 7.0 (GAUZE/BANDAGES/DRESSINGS) ×4 IMPLANT
BLADE SURG 10 STRL SS (BLADE) ×2 IMPLANT
BLADE SURG 15 STRL LF DISP TIS (BLADE) ×2 IMPLANT
CANISTER SUCT 3000ML PPV (MISCELLANEOUS) ×2 IMPLANT
CHLORAPREP W/TINT 26 (MISCELLANEOUS) ×2 IMPLANT
CLIP APPLIE 9.375 MED OPEN (MISCELLANEOUS) ×2 IMPLANT
CONNECTOR 5 IN 1 STRAIGHT STRL (MISCELLANEOUS) ×2 IMPLANT
COVER SURGICAL LIGHT HANDLE (MISCELLANEOUS) ×2 IMPLANT
DERMABOND ADVANCED .7 DNX12 (GAUZE/BANDAGES/DRESSINGS) ×4 IMPLANT
DRAIN CHANNEL 19F RND (DRAIN) ×4 IMPLANT
DRAPE HALF SHEET 40X57 (DRAPES) ×4 IMPLANT
DRAPE INCISE 23X17 STRL (DRAPES) ×2 IMPLANT
DRAPE INCISE IOBAN 23X17 STRL (DRAPES)
DRAPE INCISE IOBAN 85X60 (DRAPES) IMPLANT
DRAPE SURG 17X23 STRL (DRAPES) ×8 IMPLANT
DRAPE SURG ORHT 6 SPLT 77X108 (DRAPES) ×4 IMPLANT
DRAPE WARM FLUID 44X44 (DRAPES) ×2 IMPLANT
DRSG MEPILEX POST OP 4X8 (GAUZE/BANDAGES/DRESSINGS) ×2 IMPLANT
DRSG TEGADERM 4X4.5 CHG (GAUZE/BANDAGES/DRESSINGS) IMPLANT
ELECT BLADE 4.0 EZ CLEAN MEGAD (MISCELLANEOUS) ×1
ELECT BLADE 6.5 EXT (BLADE) IMPLANT
ELECT CAUTERY BLADE 6.4 (BLADE) ×2 IMPLANT
ELECT REM PT RETURN 9FT ADLT (ELECTROSURGICAL) ×1
ELECTRODE BLDE 4.0 EZ CLN MEGD (MISCELLANEOUS) ×2 IMPLANT
ELECTRODE REM PT RTRN 9FT ADLT (ELECTROSURGICAL) ×2 IMPLANT
EVACUATOR SILICONE 100CC (DRAIN) ×4 IMPLANT
GAUZE PAD ABD 8X10 STRL (GAUZE/BANDAGES/DRESSINGS) ×4 IMPLANT
GAUZE SPONGE 4X4 12PLY STRL (GAUZE/BANDAGES/DRESSINGS) ×4 IMPLANT
GLOVE BIO SURGEON STRL SZ 6.5 (GLOVE) ×4 IMPLANT
GOWN STRL REUS W/ TWL LRG LVL3 (GOWN DISPOSABLE) ×4 IMPLANT
HEMOSTAT ARISTA ABSORB 3G PWDR (HEMOSTASIS) IMPLANT
IMPL EXPANDER BREAST 455CC (Breast) IMPLANT
IMPLANT EXPANDER BREAST 455CC (Breast) ×1 IMPLANT
KIT BASIN OR (CUSTOM PROCEDURE TRAY) ×2 IMPLANT
KIT FILL ASEPTIC TRANSFER (MISCELLANEOUS) IMPLANT
KIT TURNOVER KIT B (KITS) ×2 IMPLANT
NDL HYPO 25GX1X1/2 BEV (NEEDLE) ×2 IMPLANT
NEEDLE HYPO 25GX1X1/2 BEV (NEEDLE) ×1
NS IRRIG 1000ML POUR BTL (IV SOLUTION) ×4 IMPLANT
PACK GENERAL/GYN (CUSTOM PROCEDURE TRAY) ×2 IMPLANT
PAD ARMBOARD 7.5X6 YLW CONV (MISCELLANEOUS) ×2 IMPLANT
PENCIL BUTTON HOLSTER BLD 10FT (ELECTRODE) ×2 IMPLANT
PIN SAFETY STERILE (MISCELLANEOUS) ×4 IMPLANT
SET ASEPTIC TRANSFER (MISCELLANEOUS) IMPLANT
SPIKE FLUID TRANSFER (MISCELLANEOUS) ×2 IMPLANT
SPONGE T-LAP 18X18 ~~LOC~~+RFID (SPONGE) IMPLANT
STAPLER VISISTAT 35W (STAPLE) ×2 IMPLANT
STRIP CLOSURE SKIN 1/2X4 (GAUZE/BANDAGES/DRESSINGS) IMPLANT
SUT MNCRL AB 3-0 PS2 18 (SUTURE) ×4 IMPLANT
SUT MON AB 4-0 PS1 27 (SUTURE) ×4 IMPLANT
SUT MON AB 5-0 PS2 18 (SUTURE) ×4 IMPLANT
SUT PDS AB 2-0 CT1 27 (SUTURE) ×8 IMPLANT
SUT PDS AB 3-0 SH 27 (SUTURE) IMPLANT
SUT SILK 3 0 SH 30 (SUTURE) ×2 IMPLANT
SUT SILK 3-0 FS1 18XBRD (SUTURE) ×4 IMPLANT
SUT VIC AB 3-0 SH 27X BRD (SUTURE) ×6 IMPLANT
SYR BULB IRRIG 60ML STRL (SYRINGE) ×2 IMPLANT
SYR CONTROL 10ML LL (SYRINGE) ×2 IMPLANT
TOWEL GREEN STERILE (TOWEL DISPOSABLE) ×2 IMPLANT
TOWEL GREEN STERILE FF (TOWEL DISPOSABLE) ×2 IMPLANT
TRAY FOLEY MTR SLVR 14FR STAT (SET/KITS/TRAYS/PACK) IMPLANT
TUBE CONNECTING 12X1/4 (SUCTIONS) ×2 IMPLANT
YANKAUER SUCT BULB TIP NO VENT (SUCTIONS) ×2 IMPLANT

## 2023-03-26 NOTE — Transfer of Care (Signed)
Immediate Anesthesia Transfer of Care Note  Patient: Meghan Mcdonald  Procedure(s) Performed: right latissimus muscle flap with expander placement (Right: Breast)  Patient Location: PACU  Anesthesia Type:General  Level of Consciousness: drowsy  Airway & Oxygen Therapy: Patient Spontanous Breathing and Patient connected to face mask oxygen  Post-op Assessment: Report given to RN and Post -op Vital signs reviewed and stable  Post vital signs: Reviewed and stable  Last Vitals:  Vitals Value Taken Time  BP 147/73 03/26/23 1022  Temp    Pulse 80 03/26/23 1025  Resp 13 03/26/23 1025  SpO2 100 % 03/26/23 1025  Vitals shown include unfiled device data.  Last Pain:  Vitals:   03/26/23 0611  TempSrc:   PainSc: 0-No pain         Complications: No notable events documented.

## 2023-03-26 NOTE — Anesthesia Postprocedure Evaluation (Signed)
Anesthesia Post Note  Patient: Carolee Rota  Procedure(s) Performed: right latissimus muscle flap with expander placement (Right: Breast)     Patient location during evaluation: PACU Anesthesia Type: General Level of consciousness: awake and alert Pain management: pain level controlled Vital Signs Assessment: post-procedure vital signs reviewed and stable Respiratory status: spontaneous breathing, nonlabored ventilation and respiratory function stable Cardiovascular status: stable and blood pressure returned to baseline Anesthetic complications: no   No notable events documented.  Last Vitals:  Vitals:   03/26/23 1230 03/26/23 1300  BP: 126/72 (!) 144/76  Pulse: 72 81  Resp: 13 19  Temp:    SpO2: 96% 98%    Last Pain:  Vitals:   03/26/23 1030  TempSrc:   PainSc: 5                  Beryle Lathe

## 2023-03-26 NOTE — Plan of Care (Signed)

## 2023-03-26 NOTE — Anesthesia Procedure Notes (Signed)
Procedure Name: Intubation Date/Time: 03/26/2023 7:43 AM  Performed by: Loleta Halei Hanover, CRNAPre-anesthesia Checklist: Patient identified, Emergency Drugs available, Suction available and Patient being monitored Patient Re-evaluated:Patient Re-evaluated prior to induction Oxygen Delivery Method: Circle system utilized Preoxygenation: Pre-oxygenation with 100% oxygen Induction Type: IV induction Ventilation: Mask ventilation without difficulty Laryngoscope Size: Miller and 2 Grade View: Grade I Tube type: Oral Tube size: 7.0 mm Number of attempts: 1 Airway Equipment and Method: Stylet Placement Confirmation: ETT inserted through vocal cords under direct vision, positive ETCO2 and breath sounds checked- equal and bilateral Secured at: 21 cm Tube secured with: Tape Dental Injury: Teeth and Oropharynx as per pre-operative assessment  Comments: Inserted by Hassel Neth

## 2023-03-26 NOTE — Op Note (Signed)
DATE OF OPERATION: 03/26/2023  LOCATION: [facility]  PREOPERATIVE DIAGNOSIS:  Breast Cancer Status post right mastectomy. 2.   Acquired absence of right Breast.  POSTOPERATIVE DIAGNOSIS: Same  PROCEDURE: 1. Latissimus myocutaneous flap to reconstruct the right breast 2. Tissue expander placement right breast  EXPANDER: [place] 455 cc expander with 50 cc of saline placed  SURGEON: Foster Simpson, DO  ASSISTANT: Evelena Leyden, PA  EBL: 100 cc  SPECIMEN: None  DRAINS: 3 total 19 blake round drains  CONDITION: Stable  COMPLICATIONS: None  INDICATION: The patient, Meghan Mcdonald, is a 67 y.o. female born on [birth year], is here for treatment after a right mastectomy with resulting absence of breast and asymmetry. She had an expander placed at the time of the [facility] but had to have it removed. She is back for further reconstruction.  PROCEDURE DETAILS:  The patient was seen on the morning of her surgery and marked for the location of the flap.  An IV was placed and IV antibiotics were given. The patient was taken to the operating room and placed on the operating room table.  A general anesthetic was administered.  A standard time out was performed and all information was confirmed to be correct by those in the room. Leg compression devices were placed on her legs.  The patient was placed in the left lateral decubitus position on a beanbag.  All key prominent points were padded and an axillary roll was placed in the dependent axilla. The ipsilateral arm was placed on a padded rest anteriosuperiorly.  She was then prepped and draped in the standard sterile fashion. The paddle design and position were confirmed.   The procedure began by incising the skin at the marked skin paddle.  The #10 blade and bovie were used to dissect down to the latissimus muscle.  Anterior and posterior flaps were raised to expose the latissimus muscle edges.  The skin and fat flaps were elevated to  the extent necessary for release of the muscle.  The muscle was released at the superior edge of the inferior angle of the scapula.  This aids in identification of the serratus muscle to prevent lifting it with the latissimus muscle.  The larger caliber perforator were clipped and the smaller vessels controlled with electrocautery.  The dissection continued toward the midline and inferior toward the iliac crest.  The subscapular artery to the thoracodorsal artery was palpated in the axilla.  The branch to the serratus is ligated.  Care was taken to protect the vascular pedicle throughout this portion of the procedure. The paddle and muscle looked healthy throughout the case.  The old mastectomy scar was excised and the flaps on top of the pectoralis muscle were raised.  The posterior and anterior pockets were then connected in the plane above the muscle. The muscle from the back and the skin paddle were then rotated into the chest pocket and covered with an ioban dressing. The flap and skin pedicle were inspected and there was no tension. The back pocket irrigated with Vashe.  Arista was placed and Experel. Two #19 blake round drains were place in the anterior skin flap and secured with 3-0 Silk. One drain was positioned inferiorly and one superiorly.  The back incision was closed in layers with buried 3-0 Monocryl and 4-0 Monocryl.  Dermabond and a protective dressing was applied.   The patient was repositioned onto her back and the chest was prepped and draped. The #10 blade was used to open  the previous breast incision.  The scar was excised.  The bovie was used to lift the mastectomy flap off of the pectoralis muscle. The breast pocket was inspected and hemostases was achieved with electrocautery. I did not lift the pectoralis muscle as I was concerned there would be too much bleeding. The latissimus muscle was then secured superiorly to the pectoralis muscle with 3-0 PDS. A 455 cc expander was chosen. It was  soaked in Vashe and evacuated of air and then filled with 50 cc of sterile saline. The inferior portion of the muscle was tacked to the inframammary fold with 3-0 PDS.  One drain was placed on this side and secured with 3-0 Silk. The flap was then closed with 3-0 Monocryl deep, followed by 4-0 Monocryl.  Dermabond, ABDs and a breast binder was applied.  The patient was allowed to wake up and taken to recovery room in stable condition at the end of the case. The family was notified at the end of the case.   The advanced practice practitioner (APP) assisted throughout the case.  The APP was essential in retraction and counter traction when needed to make the case progress smoothly.  This retraction and assistance made it possible to see the tissue plans for the procedure.  The assistance was needed for blood control, tissue re-approximation and assisted with closure of the incision site.

## 2023-03-26 NOTE — Interval H&P Note (Signed)
History and Physical Interval Note:  03/26/2023 7:14 AM  Meghan Mcdonald  has presented today for surgery, with the diagnosis of hx of breast cancer.  The various methods of treatment have been discussed with the patient and family. After consideration of risks, benefits and other options for treatment, the patient has consented to  Procedure(s): right latissimus muscle flap with expander placement (Right) right latissimus muscle flap with expander placement (Right) as a surgical intervention.  The patient's history has been reviewed, patient examined, no change in status, stable for surgery.  I have reviewed the patient's chart and labs.  Questions were answered to the patient's satisfaction.     Alena Bills Saina Waage

## 2023-03-26 NOTE — Discharge Instructions (Signed)

## 2023-03-27 ENCOUNTER — Encounter (HOSPITAL_COMMUNITY): Payer: Self-pay | Admitting: Plastic Surgery

## 2023-03-27 LAB — CBC WITH DIFFERENTIAL/PLATELET
Abs Immature Granulocytes: 0.02 10*3/uL (ref 0.00–0.07)
Basophils Absolute: 0 10*3/uL (ref 0.0–0.1)
Basophils Relative: 1 %
Eosinophils Absolute: 0.1 10*3/uL (ref 0.0–0.5)
Eosinophils Relative: 1 %
HCT: 32.8 % — ABNORMAL LOW (ref 36.0–46.0)
Hemoglobin: 11.2 g/dL — ABNORMAL LOW (ref 12.0–15.0)
Immature Granulocytes: 0 %
Lymphocytes Relative: 34 %
Lymphs Abs: 2.3 10*3/uL (ref 0.7–4.0)
MCH: 30.5 pg (ref 26.0–34.0)
MCHC: 34.1 g/dL (ref 30.0–36.0)
MCV: 89.4 fL (ref 80.0–100.0)
Monocytes Absolute: 0.8 10*3/uL (ref 0.1–1.0)
Monocytes Relative: 11 %
Neutro Abs: 3.5 10*3/uL (ref 1.7–7.7)
Neutrophils Relative %: 53 %
Platelets: 134 10*3/uL — ABNORMAL LOW (ref 150–400)
RBC: 3.67 MIL/uL — ABNORMAL LOW (ref 3.87–5.11)
RDW: 13.9 % (ref 11.5–15.5)
WBC: 6.7 10*3/uL (ref 4.0–10.5)
nRBC: 0 % (ref 0.0–0.2)

## 2023-03-27 NOTE — Progress Notes (Signed)
   03/27/23 1357  TOC Brief Assessment  Insurance and Status Reviewed  Patient has primary care physician Yes  Home environment has been reviewed spouse  Prior level of function: independent  Prior/Current Home Services No current home services  Social Determinants of Health Reivew SDOH reviewed no interventions necessary  Readmission risk has been reviewed Yes  Transition of care needs no transition of care needs at this time       Transition of Care Department [facility]) has reviewed patient and no TOC needs have been identified at this time. We will continue to monitor patient advancement through interdisciplinary progression rounds. If new patient transition needs arise, please place a TOC consult.

## 2023-03-27 NOTE — Plan of Care (Signed)
  Problem: Activity: Goal: Risk for activity intolerance will decrease Outcome: Progressing   Problem: Pain Management: Goal: General experience of comfort will improve Outcome: Progressing

## 2023-03-27 NOTE — Progress Notes (Signed)
Mobility Specialist Progress Note:   03/27/23 0926  Mobility  Activity Ambulated with assistance in hallway  Level of Assistance Contact guard assist, steadying assist  Assistive Device Other (Comment) (IV Pole)  Distance Ambulated (ft) 200 ft  Activity Response Tolerated well  Mobility Referral Yes  Mobility visit 1 Mobility  Mobility Specialist Start Time (ACUTE ONLY) [id number]  Mobility Specialist Stop Time (ACUTE ONLY) 0940  Mobility Specialist Time Calculation (min) (ACUTE ONLY) 14 min   Pt agreeable to mobility session. Required only minG assist at times d/t pt c/o lightheadedness, and SOB which resolved with rest break.C/o mild incisional pain. Pt back in bed with all needs met. Alarm on.  Addison Lank Mobility Specialist Please contact via SecureChat or  Rehab office at [phone]

## 2023-03-27 NOTE — Progress Notes (Signed)
1 Day Post-Op  Subjective: Patient is a very pleasant 67 year old female with history of right mastectomy and immediate right breast reconstruction.  She subsequently underwent removal of the right breast tissue expander due to a right breast wound.   She has a past medical history of hypertension, hyperlipidemia, GERD, diabetes mellitus, ventricular tachycardia  She presented to [facility] yesterday for right latissimus myocutaneous muscle flap with placement of tissue expander with Dr. Ulice Bold.  Patient reports that she is overall doing well this morning, reports pain has been manageable.  She does report some muscle spasms to the right breast, but reports this has been manageable with Robaxin. She is not having any infectious symptoms.  She does report that she has experienced some dizziness/lightheadedness while ambulating to the bathroom.   She has not been ambulating this a.m. just yet.  She reports she is drinking plenty of fluids, and has been urinating clear.  She did eat breakfast this a.m.  She does not report any shortness of breath or chest pain.  No fevers or chills today.  Drain output yesterday from the right back drain 1 was 298 cc, 490 cc from right back drain 2.  Right breast drain output yesterday was 215 cc. Total drain output yesterday 1000cc.  Total output this a.m. from 730 until 9 AM approximately 80 cc from the back, 25 cc from the right breast.   Objective: Vital signs in last 24 hours: Temp:  [97.7 F (36.5 C)-98.4 F (36.9 C)] 98.4 F (36.9 C) (12/06 0853) Pulse Rate:  [68-92] 92 (12/06 0853) Resp:  [12-19] 18 (12/06 0853) BP: (102-147)/(59-83) 122/60 (12/06 0853) SpO2:  [92 %-99 %] 92 % (12/06 0853) Last BM Date : 03/25/23  Intake/Output from previous day: 12/05 0701 - 12/06 0700 In: 1993.7 [P.O.:240; I.V.:1453.6; IV Piggyback:300.1] Out: 1013 [Drains:1003; Blood:10] Intake/Output this shift: Total I/O In: 240 [P.O.:240] Out: 55 [Drains:55]  General  appearance: alert, cooperative, no distress, and resting in bed, RN at bedside.  Neighbor at bedside. Head: Normocephalic, without obvious abnormality, atraumatic.  Pale palpebral conjunctiva noted. Back: Right back dressing is in place, unsoiled.  2 JP drains are in place with sanguinous drainage in each bulb.  Approximately 25 cc in drain 2 and 10 cc in drain 1.  There is no erythema or cellulitic changes.  I do not appreciate any subcutaneous fluid or palpable hematoma of her back.  No tenderness noted with palpation. Resp: Unlabored Breasts: Normal appearance of left breast, right breast status postmastectomy with latissimus myocutaneous muscle flap skin paddle in place, good color and capillary refill noted.  Incisions appear intact.  There is no subcutaneous fluid collection noted with palpation of the breast, laterally within the axilla she does have some fullness present.  No significant ecchymosis noted.  No tenderness noted with palpation.  Right breast JP drain with sanguinous drainage in bulb, approximately 15 cc.  Extremities: extremities normal, atraumatic, no cyanosis or edema.  SCDs are in place and functioning.   Lab Results:     Latest Ref Rng & Units 03/03/2023   12:53 PM 09/19/2022    1:45 PM 05/28/2022   11:49 AM  CBC  WBC 4.0 - 10.5 K/uL 5.0  5.6  7.1   Hemoglobin 12.0 - 15.0 g/dL 66.4  40.3  47.4   Hematocrit 36.0 - 46.0 % 47.0  44.0  48.3   Platelets 150 - 400 K/uL 141  254  195     BMET No results for input(s): "NA", "  K", "CL", "CO2", "GLUCOSE", "BUN", "CREATININE", "CALCIUM" in the last 72 hours. PT/INR No results for input(s): "LABPROT", "INR" in the last 72 hours. ABG No results for input(s): "PHART", "HCO3" in the last 72 hours.  Invalid input(s): "PCO2", "PO2"  Studies/Results: No results found.  Anti-infectives: Anti-infectives (From admission, onward)    Start     Dose/Rate Route Frequency Ordered Stop   03/26/23 1530  ceFAZolin (ANCEF) IVPB 2g/100 mL  premix        2 g 200 mL/hr over 30 Minutes Intravenous Every 8 hours 03/26/23 1328 04/02/23 1529   03/26/23 0615  ceFAZolin (ANCEF) IVPB 2g/100 mL premix        2 g 200 mL/hr over 30 Minutes Intravenous On call to O.R. 03/26/23 0604 03/26/23 0756   03/26/23 0606  ceFAZolin (ANCEF) 2-4 GM/100ML-% IVPB       Note to Pharmacy: Dorann Ou N: cabinet override      03/26/23 0606 03/26/23 0754       Assessment/Plan: s/p Procedure(s): right latissimus muscle flap with expander placement  Pleasant 67 year old female status post right latissimus myocutaneous muscle flap with tissue expander placement yesterday.  She stayed overnight for observation, pain control.  Pain has been manageable and patient is comfortably resting in bed this a.m.   1000 cc of output from her drains yesterday and drainage today on exam is sanguinous, she is experiencing some dizziness/lightheadedness and has pale palpebral conjunctive on exam.   Will order CBC to evaluate hemoglobin given physical exam and drain output. Discussed with RN close monitoring of JP drain output.  Will reevaluate this afternoon.  Continue with compressive garment over breasts.  There is no signs of infection or concern on exam, vital signs have been stable.  No tachycardia has been noted.  Patient's case/physical exam findings discussed with Dr. Ulice Bold, we discussed the drain output and will plan to follow-up with the patient and reevaluate her drain output this afternoon.    LOS: 1 day    Leslee Home, PA-C 03/27/2023

## 2023-03-27 NOTE — Progress Notes (Signed)
Patient reevaluated this afternoon.  She reports she ambulated with mobility team and overall ambulated well, but did have some lightheadedness with ambulation.  She also reports that she felt winded with ambulation.  She reports this has improved.  She reports she was placed on nasal cannula after her ambulation due to her O2 sats being 92 on room air.  Per chart review she was on nasal cannula prior to ambulation today and yesterday after surgery.  Hemoglobin did drop from 15.8 preoperatively to 11.2 this a.m.  Likely related to hemoglobin loss from JP drains and surgery yesterday, JP drainage has significantly slowed with total output today from all 3 drains - 170 cc.  Suspect also a component of dilution due to increased fluid intake.    Patient reports positive flatus and normal urination at this time.  No BMs yet.  Chaperone present on exam On exam, husband is at bedside. Patient is well-developed, well-nourished, no acute distress. Nasal cannula in place. JP drains in place with slightly darker sanguinous drainage noted in all JP drain bulbs.  Approximately 5 cc in each bulb.  Breast binder is in place.  SCDs in place and functioning, no lower extremity swelling noted. Radial pulse palpable.  No tachycardia noted.  A/P: Continue to closely monitor JP drain overnight for increased output.  Drainage has significantly slowed.  Will recheck CBC in a.m. to evaluate hemoglobin.  Continue to drink plenty of fluids, continue with IV fluids.  Pain is well-controlled on p.o. medications at this time.  Continue with compressive garments.  Recommend use of incentive spirometer. DVT prophylaxis with SCDs.  Patient's case discussed with Dr. Ulice Bold, we will plan to reevaluate patient in a.m. and possibly discharge pending overnight events.  All of the patient and her husband's questions were answered to their content.

## 2023-03-28 LAB — CBC
HCT: 29.5 % — ABNORMAL LOW (ref 36.0–46.0)
Hemoglobin: 10 g/dL — ABNORMAL LOW (ref 12.0–15.0)
MCH: 30.8 pg (ref 26.0–34.0)
MCHC: 33.9 g/dL (ref 30.0–36.0)
MCV: 90.8 fL (ref 80.0–100.0)
Platelets: 117 10*3/uL — ABNORMAL LOW (ref 150–400)
RBC: 3.25 MIL/uL — ABNORMAL LOW (ref 3.87–5.11)
RDW: 14.2 % (ref 11.5–15.5)
WBC: 6.3 10*3/uL (ref 4.0–10.5)
nRBC: 0 % (ref 0.0–0.2)

## 2023-03-28 MED ORDER — FERROUS SULFATE 325 (65 FE) MG PO TABS
325.0000 mg | ORAL_TABLET | Freq: Every day | ORAL | Status: DC
  Administered 2023-03-28 – 2023-03-29 (×2): 325 mg via ORAL
  Filled 2023-03-28 (×2): qty 1

## 2023-03-28 MED ORDER — CEPHALEXIN 500 MG PO CAPS
500.0000 mg | ORAL_CAPSULE | Freq: Three times a day (TID) | ORAL | Status: DC
  Administered 2023-03-28 – 2023-03-29 (×3): 500 mg via ORAL
  Filled 2023-03-28 (×3): qty 1

## 2023-03-28 MED ORDER — METFORMIN HCL ER 500 MG PO TB24
1000.0000 mg | ORAL_TABLET | Freq: Every day | ORAL | Status: DC
  Administered 2023-03-28: 1000 mg via ORAL
  Filled 2023-03-28: qty 2

## 2023-03-28 MED ORDER — ATORVASTATIN CALCIUM 80 MG PO TABS
80.0000 mg | ORAL_TABLET | Freq: Every day | ORAL | Status: DC
  Administered 2023-03-28: 80 mg via ORAL
  Filled 2023-03-28: qty 1

## 2023-03-28 MED ORDER — BISACODYL 5 MG PO TBEC: 5.0000 mg | DELAYED_RELEASE_TABLET | Freq: Every day | ORAL | Status: DC | PRN

## 2023-03-28 MED ORDER — METOPROLOL TARTRATE 50 MG PO TABS
50.0000 mg | ORAL_TABLET | Freq: Two times a day (BID) | ORAL | Status: DC
  Administered 2023-03-28 – 2023-03-29 (×3): 50 mg via ORAL
  Filled 2023-03-28 (×3): qty 1

## 2023-03-28 NOTE — Progress Notes (Signed)
Mobility Specialist: Progress Note   03/28/23 1240  Mobility  Activity Ambulated with assistance in hallway  Level of Assistance Standby assist, set-up cues, supervision of patient - no hands on  Assistive Device Front wheel walker  Distance Ambulated (ft) 1100 ft  Activity Response Tolerated well  Mobility Referral Yes  Mobility visit 1 Mobility  Mobility Specialist Start Time (ACUTE ONLY) 0959  Mobility Specialist Stop Time (ACUTE ONLY) 1019  Mobility Specialist Time Calculation (min) (ACUTE ONLY) 20 min    Pt was agreeable to mobility session - received in bed. Had c/o slight dizziness before ambulation and agreed to use RW instead of no AD for safety. ModI for bed and STS, SV throughout ambulation. Had painful R breast spasm but otherwise asx throughout ambulation. Returned to room without fault. Left ambulating in BR with all needs met, call bell in reach.   Maurene Capes Mobility Specialist Please contact via SecureChat or Rehab office at [phone]

## 2023-03-28 NOTE — Progress Notes (Signed)
2 Days Post-Op  Subjective: Patient is a pleasant 67 year old female s/p right breast latissimus myocutaneous flap reconstruction with tissue expander placement 03/26/2023.  She was initially admitted overnight for observation and pain control, but has remained inpatient due to high volume output from latissimus dorsi harvest site as well as symptomatic ABLA.  Today, patient states that she is feeling much better than she was yesterday morning.  She is ambulating independently in the hall as well as to the bathroom.  She states that she is no longer lightheaded and dizzy, but does report that she was a bit short of breath this morning walking back from the bathroom.  She also feels as though her heart rate has been elevated, but she attributes this to being off of her metoprolol for the past few days.  She believes that the drain output has been downtrending as expected and pain is controlled with oral narcotics and muscle relaxants.  Objective: Vital signs in last 24 hours: Temp:  [97.9 F (36.6 C)-98.8 F (37.1 C)] 97.9 F (36.6 C) (12/07 0344) Pulse Rate:  [86-91] 86 (12/07 0344) Resp:  [16-18] 18 (12/07 0344) BP: (111-121)/(54-59) 121/54 (12/07 0344) SpO2:  [96 %-97 %] 96 % (12/07 0344) Last BM Date : 03/25/23  Intake/Output from previous day: 12/06 0701 - 12/07 0700 In: 810.6 [P.O.:240; I.V.:570.6] Out: 235 [Drains:235] Intake/Output this shift: Total I/O In: -  Out: 60 [Drains:60]  General appearance: alert, cooperative, and no appreciable pallor Breasts: Chaperone present.  Bordered Mepilex dressing is removed and the paddle appears to have excellent perfusion and brisk cap refill.  The harvest site also appears to be healing appropriately, no appreciable drainage from underneath the bordered Mepilex dressing.  No significant ecchymoses or palpable hematomas.  Drains are intact and functional, normal-appearing dark serosanguineous output in bulbs and tubes. Extremities: No lower  extremity swelling or edema.  Lab Results:     Latest Ref Rng & Units 03/28/2023    4:34 AM 03/27/2023    9:41 AM 03/03/2023   12:53 PM  CBC  WBC 4.0 - 10.5 K/uL 6.3  6.7  5.0   Hemoglobin 12.0 - 15.0 g/dL 54.0  98.1  19.1   Hematocrit 36.0 - 46.0 % 29.5  32.8  47.0   Platelets 150 - 400 K/uL 117  134  141     BMET No results for input(s): "NA", "K", "CL", "CO2", "GLUCOSE", "BUN", "CREATININE", "CALCIUM" in the last 72 hours. PT/INR No results for input(s): "LABPROT", "INR" in the last 72 hours. ABG No results for input(s): "PHART", "HCO3" in the last 72 hours.  Invalid input(s): "PCO2", "PO2"  Studies/Results: No results found.  Anti-infectives: Anti-infectives (From admission, onward)    Start     Dose/Rate Route Frequency Ordered Stop   03/26/23 1530  ceFAZolin (ANCEF) IVPB 2g/100 mL premix        2 g 200 mL/hr over 30 Minutes Intravenous Every 8 hours 03/26/23 1328 04/02/23 1529   03/26/23 0615  ceFAZolin (ANCEF) IVPB 2g/100 mL premix        2 g 200 mL/hr over 30 Minutes Intravenous On call to O.R. 03/26/23 0604 03/26/23 0756   03/26/23 0606  ceFAZolin (ANCEF) 2-4 GM/100ML-% IVPB       Note to Pharmacy: Dorann Ou N: cabinet override      03/26/23 0606 03/26/23 0754       Assessment/Plan: s/p Procedure(s): right latissimus muscle flap with expander placement    LOS: 2 days   S/p right  latissimus dorsi breast reconstruction:  -Exam today is benign and she seems to be making excellent progress -Hemoglobin down trended again from 15.8 > 11.2 > 10.0 but seemingly plateauing and no appreciable hematoma/ongoing bleeding on exam.  Bulb and tube without evidence of active bleeding and volume output is much more consistent with what we would expect postoperatively.  She is also becoming less symptomatic and is feeling improved.   -Started on ferrous sulfate on replacement -Restart metoprolol home medication -She should not require any ongoing nasal cannula as per  chart review she was 96% RA overnight -Recommend continued ambulation regularly -Regular diet.  No BM yet, will prescribe stool softener given that we are starting on iron replacement and she already endorses chronic constipation -Anticipate discharge tomorrow   Evelena Leyden, PA-C 03/28/2023

## 2023-03-29 LAB — CBC
HCT: 32.2 % — ABNORMAL LOW (ref 36.0–46.0)
Hemoglobin: 11 g/dL — ABNORMAL LOW (ref 12.0–15.0)
MCH: 31.4 pg (ref 26.0–34.0)
MCHC: 34.2 g/dL (ref 30.0–36.0)
MCV: 92 fL (ref 80.0–100.0)
Platelets: 152 10*3/uL (ref 150–400)
RBC: 3.5 MIL/uL — ABNORMAL LOW (ref 3.87–5.11)
RDW: 14 % (ref 11.5–15.5)
WBC: 5.8 10*3/uL (ref 4.0–10.5)
nRBC: 0 % (ref 0.0–0.2)

## 2023-03-29 MED ORDER — IRON (FERROUS SULFATE) 325 (65 FE) MG PO TABS: 325.0000 mg | ORAL_TABLET | Freq: Every day | ORAL | 0 refills | Status: DC

## 2023-03-29 NOTE — Discharge Summary (Cosign Needed Addendum)
Physician Discharge Summary  Patient ID: Meghan Mcdonald MRN: [id number] DOB/AGE: 06-02-1955 67 y.o.  Admit date: 03/26/2023 Discharge date: 03/29/2023  Admission Diagnoses:  Discharge Diagnoses:  Principal Problem:   Hx of breast cancer Active Problems:   Acquired absence of right breast   Discharged Condition: Patient is doing well this morning, eager to go home.  Her husband will pick her up from the hospital and assist with her postoperative recovery.  She is tolerating p.o. intake without difficulty and is voiding.  Ambulating the halls without assistance and no longer experiencing any associated lightheadedness or shortness of breath.  She is hemodynamically stable.  Pain is well-controlled and she suspects that she will be able to manage her discomfort with muscle relaxants and over-the-counter analgesics alone.  Discussed the restrictions at home as well as red flag signs and symptoms.  Follow-up in office this week as scheduled, but she understands to call the office should she have any questions or concerns in interim.  Will discharge patient home with continued iron supplementation x 1 week.  Hospital Course: Patient was initially admitted overnight for observation and pain control subsequent to right breast latissimus myocutaneous flap reconstruction with tissue expander placement.  However, she remained inpatient due to high volume output and symptomatic ABLA.  Volume output from her drains have since decreased to acceptable levels and hemoglobin has stabilized.   Consults: None  Significant Diagnostic Studies: labs: Hemoglobin 15.8 > 11.2 > 10.0 > 11.0  Treatments: surgery: right breast latissimus myocutaneous flap reconstruction with tissue expander placement   Discharge Exam: Blood pressure 133/66, pulse 70, temperature 97.9 F (36.6 C), temperature source Oral, resp. rate 19, height 5\' 2"  (1.575 m), weight 61.2 kg, SpO2 97%. General appearance: alert, cooperative, and no  pallor Chest wall: Dressings in place of latissimus dorsi harvest site as well as paddle.  No significant swelling or overlying ecchymoses appreciated.  No palpable subcutaneous fluid collections.  No obvious incisional drainage. Extremities: No lower extremity swelling or edema  Disposition: Discharge disposition: 01-Home or Self Care      Discharge home into care of husband.  Follow-up in clinic this week as scheduled. Discharge Instructions     Diet - low sodium heart healthy   Complete by: As directed    Increase activity slowly   Complete by: As directed       Allergies as of 03/29/2023       Reactions   Hibiclens [chlorhexidine Gluconate] Rash   Skin blisters   Prednisone Rash   Patient states turns red all over    Aspirin Other (See Comments)   Severe Blood Thinning   Micardis [telmisartan] Other (See Comments)   unknown   Amitriptyline Other (See Comments)   oversedation   Loestrin [norethindrone Acet-ethinyl Est] Rash        Medication List     TAKE these medications    acetaminophen 500 MG tablet Commonly known as: TYLENOL Take 1,000 mg by mouth as needed for moderate pain.   atorvastatin 80 MG tablet Commonly known as: LIPITOR Take 1 tablet (80 mg total) by mouth daily at 6 PM.   Azelastine HCl 137 MCG/SPRAY Soln Place 1 spray into both nostrils 2 (two) times daily as needed (allergies).   CALCIUM 600 + D PO Take 1 tablet by mouth daily.   cephALEXin 500 MG capsule Commonly known as: KEFLEX Take 500 mg by mouth 4 (four) times daily. Take for 5 days starting after surgery   clobetasol ointment 0.05 %  Commonly known as: TEMOVATE Apply 1 Application topically 2 (two) times a week.   [employer] SLEEP SUPPORT PO Take 1 tablet by mouth at bedtime.   diphenhydrAMINE 25 MG tablet Commonly known as: BENADRYL Take 50 mg by mouth at bedtime.   Fish Oil 1000 MG Caps Take 1,000 mg by mouth every evening.   hydrochlorothiazide 25 MG tablet Commonly  known as: HYDRODIURIL Take 12.5 mg by mouth daily.   Iron (Ferrous Sulfate) 325 (65 Fe) MG Tabs Take 325 mg by mouth daily for 7 days.   meloxicam 15 MG tablet Commonly known as: MOBIC Take 15 mg by mouth daily as needed (inflammation/pain.).   metFORMIN 500 MG 24 hr tablet Commonly known as: GLUCOPHAGE-XR Take 1,000 mg by mouth daily with supper.   metoprolol tartrate 50 MG tablet Commonly known as: LOPRESSOR Take 50 mg by mouth 2 (two) times daily.   naproxen sodium 220 MG tablet Commonly known as: ALEVE Take 220 mg by mouth 2 (two) times daily as needed (pain).   ondansetron 4 MG tablet Commonly known as: Zofran Take 1 tablet (4 mg total) by mouth every 8 (eight) hours as needed for nausea or vomiting.   oxyCODONE 5 MG immediate release tablet Commonly known as: Oxy IR/ROXICODONE Take 5 mg by mouth every 6 (six) hours as needed for severe pain (pain score 7-10).   PreserVision AREDS 2 Caps Take 1 capsule by mouth daily.   ramipril 10 MG capsule Commonly known as: ALTACE Take 10 mg by mouth 2 (two) times daily.   Tums Ultra 1000 1000 MG chewable tablet Generic drug: calcium elemental as carbonate Chew 1,000 mg by mouth daily as needed for heartburn.        Follow-up Information     Dillingham, Alena Bills, DO Follow up in 10 day(s).   Specialty: Plastic Surgery Contact information: [address] [place] [address] [place] [address] [phone]                 [facility] [address] [place], [place] [address] [phone]  Signed: Evelena Leyden 03/29/2023, 9:46 AM

## 2023-04-03 ENCOUNTER — Ambulatory Visit (INDEPENDENT_AMBULATORY_CARE_PROVIDER_SITE_OTHER): Payer: Medicare HMO | Admitting: Plastic Surgery

## 2023-04-03 VITALS — BP 145/79 | HR 73

## 2023-04-03 DIAGNOSIS — Z9011 Acquired absence of right breast and nipple: Secondary | ICD-10-CM

## 2023-04-03 MED ORDER — DIAZEPAM 2 MG PO TABS: 2.0000 mg | ORAL_TABLET | Freq: Two times a day (BID) | ORAL | 0 refills | Status: AC | PRN

## 2023-04-03 NOTE — Progress Notes (Signed)
   Subjective:    Patient ID: Meghan Mcdonald, female    DOB: 1956/04/20, 67 y.o.   MRN: [id number]  The patient is a 67 year old female here for follow-up after right breast reconstruction.  She underwent a mastectomy with expander placement.  She had some difficulties so the expander was removed.  She had revision done last week with latissimus muscle flap and expander placement.  At the time of surgery we put 50 cc in the expander.  She is having a little bit of trouble sleeping but otherwise doing well.  She is moving her bowels.      Review of Systems  Constitutional:  Positive for activity change and appetite change.  Eyes: Negative.   Respiratory: Negative.    Cardiovascular: Negative.   Genitourinary: Negative.        Objective:   Physical Exam Constitutional:      Appearance: Normal appearance.  Cardiovascular:     Rate and Rhythm: Normal rate.     Pulses: Normal pulses.  Neurological:     Mental Status: She is alert and oriented to person, place, and time.  Psychiatric:        Mood and Affect: Mood normal.        Behavior: Behavior normal.        Thought Content: Thought content normal.        Judgment: Judgment normal.         Assessment & Plan:     ICD-10-CM   1. Acquired absence of right breast  Z90.11        We placed injectable saline in the Expander using a sterile technique: Right: 50 cc for a total of 100 / 455 cc  Make sure she has enough Valium to help with the muscle relaxation as we expand her right breast.

## 2023-04-08 ENCOUNTER — Telehealth: Payer: Self-pay | Admitting: Plastic Surgery

## 2023-04-08 NOTE — Telephone Encounter (Signed)
provider out of office, can see sooner or dif day, called 04-08-23 and left vmail

## 2023-04-10 ENCOUNTER — Ambulatory Visit (INDEPENDENT_AMBULATORY_CARE_PROVIDER_SITE_OTHER): Payer: Medicare HMO | Admitting: Plastic Surgery

## 2023-04-10 ENCOUNTER — Encounter: Payer: Self-pay | Admitting: Plastic Surgery

## 2023-04-10 VITALS — BP 117/79 | HR 65 | Ht 62.0 in | Wt 135.0 lb

## 2023-04-10 DIAGNOSIS — Z853 Personal history of malignant neoplasm of breast: Secondary | ICD-10-CM

## 2023-04-10 DIAGNOSIS — N6489 Other specified disorders of breast: Secondary | ICD-10-CM

## 2023-04-10 DIAGNOSIS — Z9011 Acquired absence of right breast and nipple: Secondary | ICD-10-CM

## 2023-04-10 DIAGNOSIS — Z9889 Other specified postprocedural states: Secondary | ICD-10-CM

## 2023-04-10 NOTE — Progress Notes (Signed)
   Subjective:    Patient ID: Carolee Rota, female    DOB: [birth year], 67 y.o.   MRN: [id number]  The patient is a 67 year old female here for follow-up after undergoing a latissimus muscle flap to the right breast.  Her swelling and bruising has improved a little bit.  The skin is still a little bit tight.  The drain output has been very minimal.  No sign of infection or seroma.      Review of Systems  Constitutional: Negative.   Eyes: Negative.   Respiratory: Negative.    Cardiovascular: Negative.   Gastrointestinal: Negative.   Endocrine: Negative.   Genitourinary: Negative.        Objective:   Physical Exam Constitutional:      Appearance: Normal appearance.  Cardiovascular:     Rate and Rhythm: Normal rate.     Pulses: Normal pulses.  Skin:    Capillary Refill: Capillary refill takes less than 2 seconds.  Neurological:     Mental Status: She is alert and oriented to person, place, and time.  Psychiatric:        Mood and Affect: Mood normal.        Behavior: Behavior normal.        Thought Content: Thought content normal.        Judgment: Judgment normal.        Assessment & Plan:     ICD-10-CM   1. Acquired absence of right breast  Z90.11     2. Hx of breast cancer  Z85.3     3. Postoperative breast asymmetry  N64.89     4. S/P breast reconstruction  Z98.890       The breast drain was removed.  Will get a give the back drain a little more time.  Will plan to see her back next week.  I did not do any expansion today.

## 2023-04-13 ENCOUNTER — Other Ambulatory Visit: Payer: Medicare HMO

## 2023-04-13 ENCOUNTER — Encounter: Payer: Medicare HMO | Admitting: Surgical

## 2023-04-14 ENCOUNTER — Encounter: Payer: Medicare HMO | Admitting: Student

## 2023-04-14 ENCOUNTER — Ambulatory Visit: Payer: Medicare HMO | Admitting: Physician Assistant

## 2023-04-14 ENCOUNTER — Inpatient Hospital Stay: Payer: Medicare HMO | Attending: Hematology

## 2023-04-14 VITALS — BP 153/79 | HR 68

## 2023-04-14 DIAGNOSIS — C50411 Malignant neoplasm of upper-outer quadrant of right female breast: Secondary | ICD-10-CM | POA: Diagnosis not present

## 2023-04-14 DIAGNOSIS — Z9889 Other specified postprocedural states: Secondary | ICD-10-CM

## 2023-04-14 DIAGNOSIS — Z79810 Long term (current) use of selective estrogen receptor modulators (SERMs): Secondary | ICD-10-CM | POA: Diagnosis not present

## 2023-04-14 DIAGNOSIS — Z170 Estrogen receptor positive status [ER+]: Secondary | ICD-10-CM | POA: Diagnosis not present

## 2023-04-14 DIAGNOSIS — D0511 Intraductal carcinoma in situ of right breast: Secondary | ICD-10-CM

## 2023-04-14 LAB — CMP (CANCER CENTER ONLY)
ALT: 71 U/L — ABNORMAL HIGH (ref 0–44)
AST: 50 U/L — ABNORMAL HIGH (ref 15–41)
Albumin: 3.8 g/dL (ref 3.5–5.0)
Alkaline Phosphatase: 79 U/L (ref 38–126)
Anion gap: 4 — ABNORMAL LOW (ref 5–15)
BUN: 13 mg/dL (ref 8–23)
CO2: 29 mmol/L (ref 22–32)
Calcium: 9.2 mg/dL (ref 8.9–10.3)
Chloride: 108 mmol/L (ref 98–111)
Creatinine: 0.73 mg/dL (ref 0.44–1.00)
GFR, Estimated: >60 mL/min (ref >60)
Glucose, Bld: 139 mg/dL — ABNORMAL HIGH (ref 70–99)
Potassium: 3.9 mmol/L (ref 3.5–5.1)
Sodium: 141 mmol/L (ref 135–145)
Total Bilirubin: 0.6 mg/dL (ref <1.2)
Total Protein: 6.9 g/dL (ref 6.5–8.1)

## 2023-04-14 LAB — CBC WITH DIFFERENTIAL (CANCER CENTER ONLY)
Abs Immature Granulocytes: 0.01 10*3/uL (ref 0.00–0.07)
Basophils Absolute: 0.1 10*3/uL (ref 0.0–0.1)
Basophils Relative: 2 %
Eosinophils Absolute: 0.6 10*3/uL — ABNORMAL HIGH (ref 0.0–0.5)
Eosinophils Relative: 13 %
HCT: 38.1 % (ref 36.0–46.0)
Hemoglobin: 12.9 g/dL (ref 12.0–15.0)
Immature Granulocytes: 0 %
Lymphocytes Relative: 35 %
Lymphs Abs: 1.5 10*3/uL (ref 0.7–4.0)
MCH: 31.9 pg (ref 26.0–34.0)
MCHC: 33.9 g/dL (ref 30.0–36.0)
MCV: 94.1 fL (ref 80.0–100.0)
Monocytes Absolute: 0.4 10*3/uL (ref 0.1–1.0)
Monocytes Relative: 10 %
Neutro Abs: 1.8 10*3/uL (ref 1.7–7.7)
Neutrophils Relative %: 40 %
Platelet Count: 211 10*3/uL (ref 150–400)
RBC: 4.05 MIL/uL (ref 3.87–5.11)
RDW: 14.6 % (ref 11.5–15.5)
WBC Count: 4.4 10*3/uL (ref 4.0–10.5)
nRBC: 0 % (ref 0.0–0.2)

## 2023-04-14 NOTE — Progress Notes (Addendum)
Patient is a pleasant 67 year old female s/p right breast latissimus myocutaneous flap reconstruction with tissue expander placement performed 03/26/2023 by Dr. Ulice Bold who presents to clinic for postoperative follow-up.  She was last seen here in clinic 04/10/2023.  At that time, she had 100/455 cc in her expander.  Swelling and bruising had improved, but skin a bit taut.  Drain output from breast was minimal and removed without complication.  Plan to leave the back drain in longer.  No expansion performed.  Today, patient is doing well.  She is companied by her husband at bedside.  She states the drain output from her latissimus dorsi harvest site has been minimal the past few days, approximately 30 cc.  They report that it is light in character and has been steadily less output despite recent increase activity.  They are hopeful for removal today.  She will occasionally experience muscle spasms in the back for which she takes Robaxin, but has been recently more tolerable and she suspects that it is at least partially attributable to the drain.  She feels as though the paddle/expander placement site has softened a bit since last encounter.  She is tolerating p.o. intake well and ambulatory.  Voiding.  Denies any chest pain, difficulty breathing, leg swelling, or fevers.  On exam, Steri-Strips are removed throughout incisions all CDI.  She is healing quite well.  No appreciable seroma in the back.  Drain intact and functional, normal-appearing output.  After discussion of risks and benefits with patient, drain was removed without complication or difficulty.  The expander site is also healing nicely.  Appropriately placed.  Skin mildly taut, but soft and likely amenable to expander fill today.  After discussion with patient, we will hold off on expander fill until next week.  Will proceed cautiously.  She states that it was shiny after her first expander fill here in the office.  Replace gauze as needed  for drain tube insertion site.  Will obtain photos at subsequent encounter.

## 2023-04-23 ENCOUNTER — Telehealth: Payer: Self-pay

## 2023-04-23 ENCOUNTER — Ambulatory Visit: Payer: Medicare HMO | Admitting: Physician Assistant

## 2023-04-23 ENCOUNTER — Other Ambulatory Visit: Payer: Self-pay

## 2023-04-23 VITALS — BP 142/80 | HR 70

## 2023-04-23 DIAGNOSIS — Z9889 Other specified postprocedural states: Secondary | ICD-10-CM

## 2023-04-23 NOTE — Telephone Encounter (Addendum)
 Called patient to relay message below as per Dr. Lanny, patient stated she wants to put the Tamoxifen  on hold until her Feb appointment ok per Dr. Lanny.   ----- Message from Onita Lanny sent at 04/22/2023  5:33 PM EST ----- Please let pt know her liver function has much improved, back to her baseline now (still mildly elevated). OK to restart tamoxifen  in next few weeks (per chart review, she is recovering well from her reconstruction surgery), thanks   Onita Lanny

## 2023-04-23 NOTE — Progress Notes (Signed)
 Patient is a pleasant 68 year old female s/p right breast latissimus myocutaneous flap reconstruction with tissue expander placement performed 03/26/2023 by Dr. Lowery who presents to clinic for postoperative follow-up.   She was last seen here in clinic on 04/14/2023.  At that time, drain output from latissimus dorsi harvest site had minimal and was subsequently removed without complication or difficulty.  Decision was made to hold off on expander fill until subsequent week.  Current volume equals 100/455 cc in her expander.  Today, patient is doing well from a postoperative standpoint.  She continues to experience intermittent muscle spasms at the site of her latissimus dorsi harvest and axillary region, but reports that it does not last long and she has not had to take any analgesics or muscle relaxants.  She denies any leg swelling, chest pain, Diffley breathing, fevers, or other concerns.  She feels prepared for expander fill.  On exam, latissimus dorsi harvest site is well-healed.  Drain tube insertion site is mildly dry appearing, but closed and no longer draining.  The paddle was well-healed, good capillary refill.  Incisions all CDI throughout.  Skin is not too taut and is amenable to expander fill.  No erythema or other overlying skin changes.  No seromas appreciated at the latissimus dorsi harvest site.  We placed injectable saline in the Expander using a sterile technique: Right: 50 cc for a total of 150 / 455 cc  Return in 10 days for possible repeat expander fill.  She states that she already feels pretty pleased with symmetry, but we did discuss how the latissimus dorsi muscle will likely atrophy and we may need to perform at least 1 more expansion before proceeding with implant exchange and contralateral mastopexy.

## 2023-04-28 DIAGNOSIS — R748 Abnormal levels of other serum enzymes: Secondary | ICD-10-CM | POA: Diagnosis not present

## 2023-04-28 DIAGNOSIS — E782 Mixed hyperlipidemia: Secondary | ICD-10-CM | POA: Diagnosis not present

## 2023-04-28 DIAGNOSIS — L900 Lichen sclerosus et atrophicus: Secondary | ICD-10-CM | POA: Diagnosis not present

## 2023-04-28 DIAGNOSIS — R6889 Other general symptoms and signs: Secondary | ICD-10-CM | POA: Diagnosis not present

## 2023-04-28 DIAGNOSIS — I10 Essential (primary) hypertension: Secondary | ICD-10-CM | POA: Diagnosis not present

## 2023-04-28 DIAGNOSIS — E1169 Type 2 diabetes mellitus with other specified complication: Secondary | ICD-10-CM | POA: Diagnosis not present

## 2023-04-29 ENCOUNTER — Other Ambulatory Visit (HOSPITAL_BASED_OUTPATIENT_CLINIC_OR_DEPARTMENT_OTHER): Payer: Self-pay | Admitting: Family Medicine

## 2023-04-29 DIAGNOSIS — R748 Abnormal levels of other serum enzymes: Secondary | ICD-10-CM

## 2023-05-04 ENCOUNTER — Ambulatory Visit (INDEPENDENT_AMBULATORY_CARE_PROVIDER_SITE_OTHER): Payer: Medicare HMO | Admitting: Physician Assistant

## 2023-05-04 VITALS — BP 158/78 | HR 66

## 2023-05-04 DIAGNOSIS — Z9889 Other specified postprocedural states: Secondary | ICD-10-CM

## 2023-05-04 NOTE — Progress Notes (Signed)
 Patient is a pleasant 68 year old female s/p right breast latissimus myocutaneous flap reconstruction with tissue expander placement performed 03/26/2023 by Dr. Lowery who presents to clinic for postoperative follow-up.   She was last seen here in clinic on 04/23/2023.  At that time, she was doing well from a postoperative standpoint.  Injectable saline placed into the expander for a total of 150/455 cc.  Return in 10 days for possible repeat expander fill.  Today, she is doing well.  She states that she did have some aching discomfort after her last expander fill radiating towards her right upper back.  However, she is feeling much better and is prepared for an additional expander fill.  She states that she is still smaller on the right side than she would like to be after her implant exchange.  She is otherwise doing well from a postoperative standpoint.  She only takes Tylenol  periodically for symptoms of discomfort.  On exam, her paddle site is appearing well-healed.  A couple of scattered sutures removed without complication or difficulty.  No erythema or induration otherwise concerning for infection.  No palpable underlying fluid collections.  We placed injectable saline in the Expander using a sterile technique: Right: 50 cc for a total of 200/455 cc  Return in 10 days for reevaluation and consideration of expander fill.  We also discussed NAC reconstruction/restoration, patient understands that it would be several months after her implant exchange.  Will obtain photos at subsequent encounter.

## 2023-05-05 DIAGNOSIS — Z1231 Encounter for screening mammogram for malignant neoplasm of breast: Secondary | ICD-10-CM | POA: Diagnosis not present

## 2023-05-08 ENCOUNTER — Ambulatory Visit (HOSPITAL_BASED_OUTPATIENT_CLINIC_OR_DEPARTMENT_OTHER)
Admission: RE | Admit: 2023-05-08 | Discharge: 2023-05-08 | Disposition: A | Discharge status: Home / Self Care | Payer: Medicare HMO | Source: Ambulatory Visit | Attending: Family Medicine | Admitting: Family Medicine

## 2023-05-08 DIAGNOSIS — R748 Abnormal levels of other serum enzymes: Secondary | ICD-10-CM | POA: Diagnosis not present

## 2023-05-12 DIAGNOSIS — Z961 Presence of intraocular lens: Secondary | ICD-10-CM | POA: Diagnosis not present

## 2023-05-12 DIAGNOSIS — H35371 Puckering of macula, right eye: Secondary | ICD-10-CM | POA: Diagnosis not present

## 2023-05-12 DIAGNOSIS — H18413 Arcus senilis, bilateral: Secondary | ICD-10-CM | POA: Diagnosis not present

## 2023-05-12 DIAGNOSIS — H26491 Other secondary cataract, right eye: Secondary | ICD-10-CM | POA: Diagnosis not present

## 2023-05-12 NOTE — Progress Notes (Unsigned)
Patient is a pleasant 68 year old female s/p right breast latissimus myocutaneous flap reconstruction with tissue expander placement performed 03/26/2023 by Dr. Ulice Bold who presents to clinic for postoperative follow-up.   She was last seen here in clinic on 05/04/2023.  At that time, exam was entirely benign.  50 cc injected in her expander for a total of 200/455 cc.  Discussed return in 10 days for reevaluation and consideration of additional expander fill.  Today, patient is doing well.  She feels prepared for another expander fill.  She feels though she is getting much closer in terms of symmetry.  She states that sometimes she hears a "sloshing", but otherwise denies any specific concerns.  On exam, right reconstructed breast with significantly improved symmetry compared to the contralateral native breast.  Excellent shape.  No palpable fluid collections outside the expander.  No overlying skin changes.  Skin is not too taut and does appear to be amenable to expander fill.  We placed injectable saline in the Expander using a sterile technique: Right: 50 cc for a total of 250 / 455 cc  After inclusion of expander fill, she may be at her target size.  She will return in 8 days for follow-up.  Will consider additional expander fill at that time versus simply moving forward with implant exchange.    Picture(s) obtained of the patient and placed in the chart were with the patient's or guardian's permission.

## 2023-05-14 ENCOUNTER — Encounter: Payer: Self-pay | Admitting: Physician Assistant

## 2023-05-14 ENCOUNTER — Ambulatory Visit: Payer: Medicare HMO | Admitting: Physician Assistant

## 2023-05-14 VITALS — BP 141/93 | HR 75 | Ht 62.0 in | Wt 138.0 lb

## 2023-05-14 DIAGNOSIS — Z9889 Other specified postprocedural states: Secondary | ICD-10-CM

## 2023-05-18 ENCOUNTER — Ambulatory Visit: Payer: Medicare HMO | Attending: Hematology

## 2023-05-18 VITALS — Wt 142.4 lb

## 2023-05-18 DIAGNOSIS — Z483 Aftercare following surgery for neoplasm: Secondary | ICD-10-CM | POA: Insufficient documentation

## 2023-05-18 NOTE — Therapy (Signed)
OUTPATIENT PHYSICAL THERAPY SOZO SCREENING NOTE   Patient Name: Meghan Mcdonald MRN: [id number] DOB:Feb 16, 1956, 68 y.o., female Today's Date: 05/18/2023  PCP: Koren Shiver, DO REFERRING PROVIDER: Malachy Mood, MD   PT End of Session - 05/18/23 [phone]     Visit Number 8   # unchanged due to screen only   PT Start Time 0946    PT Stop Time 0952    PT Time Calculation (min) 6 min    Activity Tolerance Patient tolerated treatment well    Behavior During Therapy WFL for tasks assessed/performed             Past Medical History:  Diagnosis Date   Anemia    years ago   Anxiety    Arthritis    Breast cancer (HCC)    Chronic shoulder pain    COVID    x 2 both mild cases   Depression    Diabetes mellitus without complication (HCC)    GERD (gastroesophageal reflux disease)    History of kidney stones    Hyperlipemia    Hypertension    Insomnia    Lichen sclerosus of vulva    Migraine    Overweight    Rapid palpitations    Ventricular tachycardia (paroxysmal) [facility] [address])    Past Surgical History:  Procedure Laterality Date   BLADDER SURGERY     BREAST IMPLANT REMOVAL Right 09/19/2022   Procedure: REMOVAL RIGHT TISSUE EXPANDER;  Surgeon: Peggye Form, DO;  Location: [facility] OR;  Service: Plastics;  Laterality: Right;   BREAST RECONSTRUCTION WITH PLACEMENT OF TISSUE EXPANDER AND FLEX HD (ACELLULAR HYDRATED DERMIS) Right 07/31/2022   Procedure: IMMEDIATE RIGHT BREAST RECONSTRUCTION WITH PLACEMENT OF TISSUE EXPANDER AND FLEX HD (ACELLULAR HYDRATED DERMIS);  Surgeon: Peggye Form, DO;  Location: [facility];  Service: Plastics;  Laterality: Right;   CARPAL TUNNEL RELEASE     CATARACT EXTRACTION Bilateral    CHOLECYSTECTOMY     LATISSIMUS FLAP TO BREAST Right 03/26/2023   Procedure: right latissimus muscle flap with expander placement;  Surgeon: Peggye Form, DO;  Location: [facility] OR;  Service: Plastics;  Laterality: Right;   MASTECTOMY W/ SENTINEL NODE  BIOPSY Right 07/31/2022   Procedure: RIGHT MASTECTOMY WITH SENTINEL LYMPH NODE BIOPSY;  Surgeon: Griselda Miner, MD;  Location: [facility];  Service: General;  Laterality: Right;   ORIF ANKLE FRACTURE Right 07/22/2018   Procedure: OPEN REDUCTION INTERNAL FIXATION (ORIF) RIGHT BIMALL ANKLE FRACTURE;  Surgeon: Terance Hart, MD;  Location: [facility];  Service: Orthopedics;  Laterality: Right;   ORIF TIBIA & FIBULA FRACTURES     TONSILLECTOMY     Patient Active Problem List   Diagnosis Date Noted   Hx of breast cancer 03/26/2023   Acquired absence of right breast 03/26/2023   Postoperative breast asymmetry 12/19/2022   S/P breast reconstruction 07/31/2022   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, estrogen receptor positive (HCC) 05/26/2022   Acute CVA (cerebrovascular accident) (HCC) 12/23/2017   Severe vertigo 12/22/2017   Vertigo 12/22/2017   Dyslipidemia 12/22/2017   Essential hypertension 12/22/2017   Nausea & vomiting 12/22/2017    REFERRING DIAG: right breast cancer at risk for lymphedema  THERAPY DIAG: Aftercare following surgery for neoplasm  PERTINENT HISTORY: Anemia, anxiety, breast CA s/p R mastectomy 07/31/22 and implant removal 09/09/22, depression, DM, GERD, HLD, HTN, migraine, palpitations, R ankle ORIF   PRECAUTIONS: right UE Lymphedema risk, None  SUBJECTIVE: Pt  returns for her 3 month L-Dex screen. "I had the lat flap in early Nov. I'm noticing a lot of postural fatigue and muscle tightness since then."   PAIN:  Are you having pain? No  SOZO SCREENING: Patient was assessed today using the SOZO machine to determine the lymphedema index score. This was compared to her baseline score. It was determined that she is within the recommended range when compared to her baseline and no further action is needed at this time. She will continue SOZO screenings. These are done every 3 months for 2 years post operatively  followed by every 6 months for 2 years, and then annually.  Spoke with pt about physical therapy options within scope of practice for her current postural weakness and tightness she reports. She sees the surgeon Friday and will speak to them about starting PT.   L-DEX FLOWSHEETS - 05/18/23 0900       L-DEX LYMPHEDEMA SCREENING   Measurement Type Unilateral    L-DEX MEASUREMENT EXTREMITY Upper Extremity    POSITION  Standing    DOMINANT SIDE Right    At Risk Side Right    BASELINE SCORE (UNILATERAL) -4    L-DEX SCORE (UNILATERAL) -0.6    VALUE CHANGE (UNILAT) 3.4               Hermenia Bers, PTA 05/18/2023, 9:54 AM

## 2023-05-19 DIAGNOSIS — Z961 Presence of intraocular lens: Secondary | ICD-10-CM | POA: Diagnosis not present

## 2023-05-22 ENCOUNTER — Encounter: Payer: Self-pay | Admitting: Physician Assistant

## 2023-05-22 ENCOUNTER — Ambulatory Visit (INDEPENDENT_AMBULATORY_CARE_PROVIDER_SITE_OTHER): Payer: Medicare HMO | Admitting: Physician Assistant

## 2023-05-22 VITALS — BP 119/61 | HR 65 | Ht 62.0 in | Wt 138.0 lb

## 2023-05-22 DIAGNOSIS — Z9889 Other specified postprocedural states: Secondary | ICD-10-CM

## 2023-05-22 NOTE — Progress Notes (Signed)
Patient is a very pleasant 68 year old female s/p right breast latissimus myocutaneous flap reconstruction with tissue expander placement performed 03/26/2023 by Dr. Ulice Bold who presents to clinic for postoperative follow-up.  She was last seen here in clinic on 05/14/2023.  Exam was entirely benign.  50 cc placed for a total of 250/455 cc.  At conclusion of expander fill, she felt as though she may be at her target size.  Will reevaluate at subsequent encounter.  Today, patient is doing well.  She did endorse some tightness after the last expander fill.  She feels as though she is an appropriate size and is looking forward to moving forward with implant exchange on the right with mass effect on the left.  No specific concerns.  On exam, right reconstructed breast with good shape and size.  Paddle appears healthy.  Left breast when lifted will have similar size and symmetry.  Discussed case with Dr. Ulice Bold who reviewed images and agrees that patient is ready to proceed with implant exchange without need for additional expansion.  Will message the surgical schedulers, as well.  Picture(s) obtained of the patient and placed in the chart were with the patient's or guardian's permission.

## 2023-05-28 ENCOUNTER — Encounter: Payer: Self-pay | Admitting: Physician Assistant

## 2023-05-28 ENCOUNTER — Ambulatory Visit: Payer: Medicare HMO | Admitting: Surgical

## 2023-05-28 VITALS — BP 111/75 | HR 67 | Ht 62.0 in | Wt 139.0 lb

## 2023-05-28 DIAGNOSIS — Z9889 Other specified postprocedural states: Secondary | ICD-10-CM

## 2023-05-28 DIAGNOSIS — N6489 Other specified disorders of breast: Secondary | ICD-10-CM

## 2023-05-28 MED ORDER — CEPHALEXIN 500 MG PO CAPS: 500.0000 mg | ORAL_CAPSULE | Freq: Four times a day (QID) | ORAL | 0 refills | Status: AC

## 2023-05-28 MED ORDER — ONDANSETRON 4 MG PO TBDP: 4.0000 mg | ORAL_TABLET | Freq: Three times a day (TID) | ORAL | 0 refills | Status: AC | PRN

## 2023-05-28 MED ORDER — OXYCODONE HCL 5 MG PO TABS: 5.0000 mg | ORAL_TABLET | Freq: Three times a day (TID) | ORAL | 0 refills | Status: AC | PRN

## 2023-05-28 NOTE — Progress Notes (Signed)
 Patient ID: Meghan Mcdonald, female    DOB: May 08, 1955, 68 y.o.   MRN: [id number]  Chief Complaint  Patient presents with   Pre-op Exam      ICD-10-CM   1. S/P breast reconstruction  Z98.890     2. Postoperative breast asymmetry  N64.89        History of Present Illness: Meghan Mcdonald is a 68 y.o.  female  with a history of right breast latissimus myocutaneous flap reconstruction with tissue expander placement.  She presents for preoperative evaluation for upcoming procedure, removal of right breast tissue expander and placement of implant with left breast mastopexy for symmetry, scheduled for 06/17/2023 with Dr. Lowery.  The patient has not had problems with anesthesia.  Previous surgeries without complication.  She has a history of breast cancer, no longer on chemotherapy given that it caused transaminitis.  Family history of DVT in her sister who is handicapped and sedentary.  No other personal or family history of blood clots or clotting disorder.  She tells me that she was misdiagnosed with CVA in 2019 and that the area concerning for ischemia was artifact.  No persistent deficits.  She denies any significant personal cardiac/pulmonary disease, inflammatory bowel disease, hormone replacement medication, or nicotine use.  DM relatively well-controlled with A1c of 6.2%.  Discussed surgery and she is agreeable to proceed.  Summary of Previous Visit: She was last seen here in clinic on 05/22/2023.  At that time, she was pleased with the size of her reconstructed right breast and was eager to proceed with next phase of her reconstruction.  Paddle appeared healthy.  Discussed case with Dr. Lowery reviewed images and agreed with proceeding to next phase of her reconstruction.  Current volume equals 250/455 cc.  Job: Retired.  PMH Significant for: Right breast cancer s/p right breast latissimus myocutaneous flap reconstruction, postoperative wound after first attempt at tissue expander  reconstruction, vertigo (CVA from 2019 was actually just artifact per patient), GERD, HLD, HTN, DM A1C 6.2%.  No history of radiation.   Past Medical History: Allergies: Allergies  Allergen Reactions   Hibiclens  [Chlorhexidine  Gluconate] Rash    Skin blisters   Prednisone Rash    Patient states turns red all over    Aspirin  Other (See Comments)    Severe Blood Thinning   Micardis [Telmisartan] Other (See Comments)    unknown   Amitriptyline Other (See Comments)    oversedation   Loestrin [Norethindrone Acet-Ethinyl Est] Rash    Current Medications:  Current Outpatient Medications:    acetaminophen  (TYLENOL ) 500 MG tablet, Take 1,000 mg by mouth as needed for moderate pain., Disp: , Rfl:    atorvastatin  (LIPITOR ) 80 MG tablet, Take 1 tablet (80 mg total) by mouth daily at 6 PM., Disp: 30 tablet, Rfl: 0   Azelastine HCl 137 MCG/SPRAY SOLN, Place 1 spray into both nostrils 2 (two) times daily as needed (allergies)., Disp: , Rfl:    Calcium  Carb-Cholecalciferol (CALCIUM  600 + D PO), Take 1 tablet by mouth daily., Disp: , Rfl:    calcium  elemental as carbonate (TUMS ULTRA 1000) 400 MG chewable tablet, Chew 1,000 mg by mouth daily as needed for heartburn., Disp: , Rfl:    cephALEXin  (KEFLEX ) 500 MG capsule, Take 1 capsule (500 mg total) by mouth 4 (four) times daily for 3 days., Disp: 12 capsule, Rfl: 0   clobetasol ointment (TEMOVATE) 0.05 %, Apply 1 Application topically 2 (two) times a week., Disp: , Rfl:  diphenhydrAMINE  (BENADRYL ) 25 MG tablet, Take 50 mg by mouth at bedtime., Disp: , Rfl:    hydrochlorothiazide (HYDRODIURIL) 25 MG tablet, Take 12.5 mg by mouth daily., Disp: , Rfl:    meloxicam (MOBIC) 15 MG tablet, Take 15 mg by mouth daily as needed (inflammation/pain.)., Disp: , Rfl:    metFORMIN  (GLUCOPHAGE -XR) 500 MG 24 hr tablet, Take 1,000 mg by mouth daily with supper., Disp: , Rfl:    metoprolol  tartrate (LOPRESSOR ) 50 MG tablet, Take 50 mg by mouth 2 (two) times daily.,  Disp: , Rfl:    Misc Natural Products ([employer] SLEEP SUPPORT PO), Take 1 tablet by mouth at bedtime., Disp: , Rfl:    Multiple Vitamins-Minerals (PRESERVISION AREDS 2) CAPS, Take 1 capsule by mouth daily., Disp: , Rfl:    naproxen sodium (ALEVE) 220 MG tablet, Take 220 mg by mouth 2 (two) times daily as needed (pain)., Disp: , Rfl:    Omega-3 Fatty Acids (FISH OIL) 1000 MG CAPS, Take 1,000 mg by mouth every evening., Disp: , Rfl:    ondansetron  (ZOFRAN ) 4 MG tablet, Take 1 tablet (4 mg total) by mouth every 8 (eight) hours as needed for nausea or vomiting., Disp: 20 tablet, Rfl: 0   ondansetron  (ZOFRAN -ODT) 4 MG disintegrating tablet, Take 1 tablet (4 mg total) by mouth every 8 (eight) hours as needed for nausea or vomiting., Disp: 20 tablet, Rfl: 0   oxyCODONE  (ROXICODONE ) 5 MG immediate release tablet, Take 1 tablet (5 mg total) by mouth every 8 (eight) hours as needed for up to 7 days for severe pain (pain score 7-10)., Disp: 20 tablet, Rfl: 0   ramipril (ALTACE) 10 MG capsule, Take 10 mg by mouth 2 (two) times daily., Disp: , Rfl:    Iron , Ferrous Sulfate , 325 (65 Fe) MG TABS, Take 325 mg by mouth daily for 7 days., Disp: 7 tablet, Rfl: 0  Past Medical Problems: Past Medical History:  Diagnosis Date   Anemia    years ago   Anxiety    Arthritis    Breast cancer (HCC)    Chronic shoulder pain    COVID    x 2 both mild cases   Depression    Diabetes mellitus without complication (HCC)    GERD (gastroesophageal reflux disease)    History of kidney stones    Hyperlipemia    Hypertension    Insomnia    Lichen sclerosus of vulva    Migraine    Overweight    Rapid palpitations    Ventricular tachycardia (paroxysmal) (HCC)     Past Surgical History: Past Surgical History:  Procedure Laterality Date   BLADDER SURGERY     BREAST IMPLANT REMOVAL Right 09/19/2022   Procedure: REMOVAL RIGHT TISSUE EXPANDER;  Surgeon: Lowery Estefana RAMAN, DO;  Location: [facility];  Service: Plastics;   Laterality: Right;   BREAST RECONSTRUCTION WITH PLACEMENT OF TISSUE EXPANDER AND FLEX HD (ACELLULAR HYDRATED DERMIS) Right 07/31/2022   Procedure: IMMEDIATE RIGHT BREAST RECONSTRUCTION WITH PLACEMENT OF TISSUE EXPANDER AND FLEX HD (ACELLULAR HYDRATED DERMIS);  Surgeon: Lowery Estefana RAMAN, DO;  Location: [facility];  Service: Plastics;  Laterality: Right;   CARPAL TUNNEL RELEASE     CATARACT EXTRACTION Bilateral    CHOLECYSTECTOMY     LATISSIMUS FLAP TO BREAST Right 03/26/2023   Procedure: right latissimus muscle flap with expander placement;  Surgeon: Lowery Estefana RAMAN, DO;  Location: [facility];  Service: Plastics;  Laterality: Right;   MASTECTOMY W/ SENTINEL NODE BIOPSY Right  07/31/2022   Procedure: RIGHT MASTECTOMY WITH SENTINEL LYMPH NODE BIOPSY;  Surgeon: Curvin Deward MOULD, MD;  Location: [facility];  Service: General;  Laterality: Right;   ORIF ANKLE FRACTURE Right 07/22/2018   Procedure: OPEN REDUCTION INTERNAL FIXATION (ORIF) RIGHT BIMALL ANKLE FRACTURE;  Surgeon: Elsa Lonni SAUNDERS, MD;  Location: [facility];  Service: Orthopedics;  Laterality: Right;   ORIF TIBIA & FIBULA FRACTURES     TONSILLECTOMY      Social History: Social History   Socioeconomic History   Marital status: Married    Spouse name: Not on file   Number of children: 2   Years of education: Not on file   Highest education level: Not on file  Occupational History   Not on file  Tobacco Use   Smoking status: Never    Passive exposure: Past   Smokeless tobacco: Never  Vaping Use   Vaping status: Never Used  Substance and Sexual Activity   Alcohol use: Yes    Alcohol/week: 4.0 standard drinks of alcohol    Types: 4 Glasses of wine per week    Comment: weekly   Drug use: Never   Sexual activity: Not on file  Other Topics Concern   Not on file  Social History Narrative   ** Merged History Encounter **        Lives with husband    Social Drivers of Manufacturing Engineer Strain: Not on file  Food Insecurity: No Food Insecurity (03/26/2023)   Hunger Vital Sign    Worried About Running Out of Food in the Last Year: Never true    Ran Out of Food in the Last Year: Never true  Transportation Needs: No Transportation Needs (03/26/2023)   PRAPARE - Administrator, Civil Service (Medical): No    Lack of Transportation (Non-Medical): No  Physical Activity: Not on file  Stress: Not on file  Social Connections: Not on file  Intimate Partner Violence: Unknown (03/26/2023)   Humiliation, Afraid, Rape, and Kick questionnaire    Fear of Current or Ex-Partner: No    Emotionally Abused: Not on file    Physically Abused: No    Sexually Abused: No    Family History: Family History  Problem Relation Age of Onset   Hypertension Mother    Hypertension Father     Review of Systems: ROS Denies any recent chest pain, difficulty breathing, leg swelling, fevers.  Physical Exam: Vital Signs BP 111/75 (BP Location: Left Arm, Patient Position: Sitting, Cuff Size: Normal)   Pulse 67   Ht 5' 2 (1.575 m)   Wt 139 lb (63 kg)   SpO2 98%   BMI 25.42 kg/m   Physical Exam Constitutional:      General: Not in acute distress.    Appearance: Normal appearance. Not ill-appearing.  HENT:     Head: Normocephalic and atraumatic.  Eyes:     Pupils: Pupils are equal, round. Cardiovascular:     Rate and Rhythm: Normal rate.    Pulses: Normal pulses.  Pulmonary:     Effort: No respiratory distress or increased work of breathing.  Speaks in full sentences. Abdominal:     General: Abdomen is flat. No distension.   Musculoskeletal: Normal range of motion. No lower extremity swelling or edema. No varicosities. Skin:    General: Skin is warm and dry.     Findings: No erythema or rash.  Neurological:     Mental Status: Alert  and oriented to person, place, and time.  Psychiatric:        Mood and Affect: Mood normal.        Behavior: Behavior  normal.    Assessment/Plan: The patient is scheduled for removal of right breast expander and placement of implant with contralateral mastopexy for symmetry with Dr. Lowery.  Risks, benefits, and alternatives of procedure discussed, questions answered and consent obtained.    Smoking Status: Non-smoker.  Last Mammogram: Patient reports left mammogram 04/2023, BI-RADS Category 1: Negative.  [facility].  Cannot verify in chart.  Caprini Score: 10; Risk Factors include: Age, BMI greater than 25, personal history of cancer, family history of DVT (sister), and length of planned surgery. Recommendation for mechanical and possibly pharmacological prophylaxis.  Will discuss with Dr. Lowery and prescribe Lovenox if indicated.  Either way, encourage early ambulation.  She did not require Lovenox after her most recent surgeries.  Pictures obtained: 05/22/2023  Post-op Rx sent to pharmacy: Oxycodone , Zofran , Keflex .  Patient was provided with the General Surgical Risk consent document and Pain Medication Agreement prior to their appointment.  They had adequate time to read through the risk consent documents and Pain Medication Agreement. We also discussed them in person together during this preop appointment. All of their questions were answered to their satisfaction.  Recommended calling if they have any further questions.  Risk consent form and Pain Medication Agreement to be scanned into patient's chart.  The risks that can be encountered with and after placement of a breast implant placement were discussed and include the following but not limited to these: bleeding, infection, delayed healing, anesthesia risks, skin sensation changes, injury to structures including nerves, blood vessels, and muscles which may be temporary or permanent, allergies to tape, suture materials and glues, blood products, topical preparations or injected agents, skin contour irregularities, skin discoloration and swelling, deep  vein thrombosis, cardiac and pulmonary complications, pain, which may persist, fluid accumulation, wrinkling of the skin over the implant, changes in nipple or breast sensation, implant leakage or rupture, faulty position of the implant, persistent pain, formation of tight scar tissue around the implant (capsular contracture), possible need for revisional surgery or staged procedures.    Electronically signed by: Honora Seip, PA-C 05/28/2023 11:03 AM

## 2023-05-28 NOTE — H&P (View-Only) (Signed)
 Patient ID: Meghan Mcdonald, female    DOB: May 08, 1955, 68 y.o.   MRN: [id number]  Chief Complaint  Patient presents with   Pre-op Exam      ICD-10-CM   1. S/P breast reconstruction  Z98.890     2. Postoperative breast asymmetry  N64.89        History of Present Illness: Meghan Mcdonald is a 68 y.o.  female  with a history of right breast latissimus myocutaneous flap reconstruction with tissue expander placement.  She presents for preoperative evaluation for upcoming procedure, removal of right breast tissue expander and placement of implant with left breast mastopexy for symmetry, scheduled for 06/17/2023 with Dr. Lowery.  The patient has not had problems with anesthesia.  Previous surgeries without complication.  She has a history of breast cancer, no longer on chemotherapy given that it caused transaminitis.  Family history of DVT in her sister who is handicapped and sedentary.  No other personal or family history of blood clots or clotting disorder.  She tells me that she was misdiagnosed with CVA in 2019 and that the area concerning for ischemia was artifact.  No persistent deficits.  She denies any significant personal cardiac/pulmonary disease, inflammatory bowel disease, hormone replacement medication, or nicotine use.  DM relatively well-controlled with A1c of 6.2%.  Discussed surgery and she is agreeable to proceed.  Summary of Previous Visit: She was last seen here in clinic on 05/22/2023.  At that time, she was pleased with the size of her reconstructed right breast and was eager to proceed with next phase of her reconstruction.  Paddle appeared healthy.  Discussed case with Dr. Lowery reviewed images and agreed with proceeding to next phase of her reconstruction.  Current volume equals 250/455 cc.  Job: Retired.  PMH Significant for: Right breast cancer s/p right breast latissimus myocutaneous flap reconstruction, postoperative wound after first attempt at tissue expander  reconstruction, vertigo (CVA from 2019 was actually just artifact per patient), GERD, HLD, HTN, DM A1C 6.2%.  No history of radiation.   Past Medical History: Allergies: Allergies  Allergen Reactions   Hibiclens  [Chlorhexidine  Gluconate] Rash    Skin blisters   Prednisone Rash    Patient states turns red all over    Aspirin  Other (See Comments)    Severe Blood Thinning   Micardis [Telmisartan] Other (See Comments)    unknown   Amitriptyline Other (See Comments)    oversedation   Loestrin [Norethindrone Acet-Ethinyl Est] Rash    Current Medications:  Current Outpatient Medications:    acetaminophen  (TYLENOL ) 500 MG tablet, Take 1,000 mg by mouth as needed for moderate pain., Disp: , Rfl:    atorvastatin  (LIPITOR ) 80 MG tablet, Take 1 tablet (80 mg total) by mouth daily at 6 PM., Disp: 30 tablet, Rfl: 0   Azelastine HCl 137 MCG/SPRAY SOLN, Place 1 spray into both nostrils 2 (two) times daily as needed (allergies)., Disp: , Rfl:    Calcium  Carb-Cholecalciferol (CALCIUM  600 + D PO), Take 1 tablet by mouth daily., Disp: , Rfl:    calcium  elemental as carbonate (TUMS ULTRA 1000) 400 MG chewable tablet, Chew 1,000 mg by mouth daily as needed for heartburn., Disp: , Rfl:    cephALEXin  (KEFLEX ) 500 MG capsule, Take 1 capsule (500 mg total) by mouth 4 (four) times daily for 3 days., Disp: 12 capsule, Rfl: 0   clobetasol ointment (TEMOVATE) 0.05 %, Apply 1 Application topically 2 (two) times a week., Disp: , Rfl:  diphenhydrAMINE  (BENADRYL ) 25 MG tablet, Take 50 mg by mouth at bedtime., Disp: , Rfl:    hydrochlorothiazide (HYDRODIURIL) 25 MG tablet, Take 12.5 mg by mouth daily., Disp: , Rfl:    meloxicam (MOBIC) 15 MG tablet, Take 15 mg by mouth daily as needed (inflammation/pain.)., Disp: , Rfl:    metFORMIN  (GLUCOPHAGE -XR) 500 MG 24 hr tablet, Take 1,000 mg by mouth daily with supper., Disp: , Rfl:    metoprolol  tartrate (LOPRESSOR ) 50 MG tablet, Take 50 mg by mouth 2 (two) times daily.,  Disp: , Rfl:    Misc Natural Products ([employer] SLEEP SUPPORT PO), Take 1 tablet by mouth at bedtime., Disp: , Rfl:    Multiple Vitamins-Minerals (PRESERVISION AREDS 2) CAPS, Take 1 capsule by mouth daily., Disp: , Rfl:    naproxen sodium (ALEVE) 220 MG tablet, Take 220 mg by mouth 2 (two) times daily as needed (pain)., Disp: , Rfl:    Omega-3 Fatty Acids (FISH OIL) 1000 MG CAPS, Take 1,000 mg by mouth every evening., Disp: , Rfl:    ondansetron  (ZOFRAN ) 4 MG tablet, Take 1 tablet (4 mg total) by mouth every 8 (eight) hours as needed for nausea or vomiting., Disp: 20 tablet, Rfl: 0   ondansetron  (ZOFRAN -ODT) 4 MG disintegrating tablet, Take 1 tablet (4 mg total) by mouth every 8 (eight) hours as needed for nausea or vomiting., Disp: 20 tablet, Rfl: 0   oxyCODONE  (ROXICODONE ) 5 MG immediate release tablet, Take 1 tablet (5 mg total) by mouth every 8 (eight) hours as needed for up to 7 days for severe pain (pain score 7-10)., Disp: 20 tablet, Rfl: 0   ramipril (ALTACE) 10 MG capsule, Take 10 mg by mouth 2 (two) times daily., Disp: , Rfl:    Iron , Ferrous Sulfate , 325 (65 Fe) MG TABS, Take 325 mg by mouth daily for 7 days., Disp: 7 tablet, Rfl: 0  Past Medical Problems: Past Medical History:  Diagnosis Date   Anemia    years ago   Anxiety    Arthritis    Breast cancer (HCC)    Chronic shoulder pain    COVID    x 2 both mild cases   Depression    Diabetes mellitus without complication (HCC)    GERD (gastroesophageal reflux disease)    History of kidney stones    Hyperlipemia    Hypertension    Insomnia    Lichen sclerosus of vulva    Migraine    Overweight    Rapid palpitations    Ventricular tachycardia (paroxysmal) (HCC)     Past Surgical History: Past Surgical History:  Procedure Laterality Date   BLADDER SURGERY     BREAST IMPLANT REMOVAL Right 09/19/2022   Procedure: REMOVAL RIGHT TISSUE EXPANDER;  Surgeon: Lowery Estefana RAMAN, DO;  Location: [facility];  Service: Plastics;   Laterality: Right;   BREAST RECONSTRUCTION WITH PLACEMENT OF TISSUE EXPANDER AND FLEX HD (ACELLULAR HYDRATED DERMIS) Right 07/31/2022   Procedure: IMMEDIATE RIGHT BREAST RECONSTRUCTION WITH PLACEMENT OF TISSUE EXPANDER AND FLEX HD (ACELLULAR HYDRATED DERMIS);  Surgeon: Lowery Estefana RAMAN, DO;  Location: [facility];  Service: Plastics;  Laterality: Right;   CARPAL TUNNEL RELEASE     CATARACT EXTRACTION Bilateral    CHOLECYSTECTOMY     LATISSIMUS FLAP TO BREAST Right 03/26/2023   Procedure: right latissimus muscle flap with expander placement;  Surgeon: Lowery Estefana RAMAN, DO;  Location: [facility];  Service: Plastics;  Laterality: Right;   MASTECTOMY W/ SENTINEL NODE BIOPSY Right  07/31/2022   Procedure: RIGHT MASTECTOMY WITH SENTINEL LYMPH NODE BIOPSY;  Surgeon: Curvin Deward MOULD, MD;  Location: [facility];  Service: General;  Laterality: Right;   ORIF ANKLE FRACTURE Right 07/22/2018   Procedure: OPEN REDUCTION INTERNAL FIXATION (ORIF) RIGHT BIMALL ANKLE FRACTURE;  Surgeon: Elsa Lonni SAUNDERS, MD;  Location: [facility];  Service: Orthopedics;  Laterality: Right;   ORIF TIBIA & FIBULA FRACTURES     TONSILLECTOMY      Social History: Social History   Socioeconomic History   Marital status: Married    Spouse name: Not on file   Number of children: 2   Years of education: Not on file   Highest education level: Not on file  Occupational History   Not on file  Tobacco Use   Smoking status: Never    Passive exposure: Past   Smokeless tobacco: Never  Vaping Use   Vaping status: Never Used  Substance and Sexual Activity   Alcohol use: Yes    Alcohol/week: 4.0 standard drinks of alcohol    Types: 4 Glasses of wine per week    Comment: weekly   Drug use: Never   Sexual activity: Not on file  Other Topics Concern   Not on file  Social History Narrative   ** Merged History Encounter **        Lives with husband    Social Drivers of Manufacturing Engineer Strain: Not on file  Food Insecurity: No Food Insecurity (03/26/2023)   Hunger Vital Sign    Worried About Running Out of Food in the Last Year: Never true    Ran Out of Food in the Last Year: Never true  Transportation Needs: No Transportation Needs (03/26/2023)   PRAPARE - Administrator, Civil Service (Medical): No    Lack of Transportation (Non-Medical): No  Physical Activity: Not on file  Stress: Not on file  Social Connections: Not on file  Intimate Partner Violence: Unknown (03/26/2023)   Humiliation, Afraid, Rape, and Kick questionnaire    Fear of Current or Ex-Partner: No    Emotionally Abused: Not on file    Physically Abused: No    Sexually Abused: No    Family History: Family History  Problem Relation Age of Onset   Hypertension Mother    Hypertension Father     Review of Systems: ROS Denies any recent chest pain, difficulty breathing, leg swelling, fevers.  Physical Exam: Vital Signs BP 111/75 (BP Location: Left Arm, Patient Position: Sitting, Cuff Size: Normal)   Pulse 67   Ht 5' 2 (1.575 m)   Wt 139 lb (63 kg)   SpO2 98%   BMI 25.42 kg/m   Physical Exam Constitutional:      General: Not in acute distress.    Appearance: Normal appearance. Not ill-appearing.  HENT:     Head: Normocephalic and atraumatic.  Eyes:     Pupils: Pupils are equal, round. Cardiovascular:     Rate and Rhythm: Normal rate.    Pulses: Normal pulses.  Pulmonary:     Effort: No respiratory distress or increased work of breathing.  Speaks in full sentences. Abdominal:     General: Abdomen is flat. No distension.   Musculoskeletal: Normal range of motion. No lower extremity swelling or edema. No varicosities. Skin:    General: Skin is warm and dry.     Findings: No erythema or rash.  Neurological:     Mental Status: Alert  and oriented to person, place, and time.  Psychiatric:        Mood and Affect: Mood normal.        Behavior: Behavior  normal.    Assessment/Plan: The patient is scheduled for removal of right breast expander and placement of implant with contralateral mastopexy for symmetry with Dr. Lowery.  Risks, benefits, and alternatives of procedure discussed, questions answered and consent obtained.    Smoking Status: Non-smoker.  Last Mammogram: Patient reports left mammogram 04/2023, BI-RADS Category 1: Negative.  [facility].  Cannot verify in chart.  Caprini Score: 10; Risk Factors include: Age, BMI greater than 25, personal history of cancer, family history of DVT (sister), and length of planned surgery. Recommendation for mechanical and possibly pharmacological prophylaxis.  Will discuss with Dr. Lowery and prescribe Lovenox if indicated.  Either way, encourage early ambulation.  She did not require Lovenox after her most recent surgeries.  Pictures obtained: 05/22/2023  Post-op Rx sent to pharmacy: Oxycodone , Zofran , Keflex .  Patient was provided with the General Surgical Risk consent document and Pain Medication Agreement prior to their appointment.  They had adequate time to read through the risk consent documents and Pain Medication Agreement. We also discussed them in person together during this preop appointment. All of their questions were answered to their satisfaction.  Recommended calling if they have any further questions.  Risk consent form and Pain Medication Agreement to be scanned into patient's chart.  The risks that can be encountered with and after placement of a breast implant placement were discussed and include the following but not limited to these: bleeding, infection, delayed healing, anesthesia risks, skin sensation changes, injury to structures including nerves, blood vessels, and muscles which may be temporary or permanent, allergies to tape, suture materials and glues, blood products, topical preparations or injected agents, skin contour irregularities, skin discoloration and swelling, deep  vein thrombosis, cardiac and pulmonary complications, pain, which may persist, fluid accumulation, wrinkling of the skin over the implant, changes in nipple or breast sensation, implant leakage or rupture, faulty position of the implant, persistent pain, formation of tight scar tissue around the implant (capsular contracture), possible need for revisional surgery or staged procedures.    Electronically signed by: Honora Seip, PA-C 05/28/2023 11:03 AM

## 2023-05-29 ENCOUNTER — Telehealth: Payer: Self-pay

## 2023-05-29 NOTE — Telephone Encounter (Signed)
 Spoke to [place] at [facility] and requested most recent mammogram report be faxed to our office. She submitted request. Will await them to be uploaded in Onbase.

## 2023-06-01 NOTE — Telephone Encounter (Signed)
 Thank you :)

## 2023-06-01 NOTE — Assessment & Plan Note (Signed)
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 -She underwent a right mastectomy on July 31, 2022.  I reviewed her surgical pathology findings with her in detail, it showed 6 mm invasive ductal carcinoma, grade 2, and DCIS.  Surgical margins were negative, lymph nodes were negative. -We discussed her Oncotype results, which showed recurrence score of 21.  This is considered low risk disease, adjuvant chemotherapy is not recommended. -She does not need postmastectomy radiation  -She started adjuvant anastrozole  in July 2024, she stopped in August 2024 due to mood swings and depression. She started Tamoxifen  10mg  dialy in Sep 2024, held in 02/2023 due to transaminitis, and to restart in December after liver function improved.

## 2023-06-02 ENCOUNTER — Encounter: Payer: Self-pay | Admitting: Hematology

## 2023-06-02 ENCOUNTER — Inpatient Hospital Stay: Payer: Medicare HMO | Admitting: Hematology

## 2023-06-02 ENCOUNTER — Inpatient Hospital Stay: Payer: Medicare HMO | Attending: Hematology

## 2023-06-02 VITALS — BP 133/82 | HR 76 | Temp 97.6°F | Resp 16 | Wt 142.2 lb

## 2023-06-02 DIAGNOSIS — C50411 Malignant neoplasm of upper-outer quadrant of right female breast: Secondary | ICD-10-CM | POA: Insufficient documentation

## 2023-06-02 DIAGNOSIS — E876 Hypokalemia: Secondary | ICD-10-CM | POA: Diagnosis not present

## 2023-06-02 DIAGNOSIS — Z170 Estrogen receptor positive status [ER+]: Secondary | ICD-10-CM | POA: Diagnosis not present

## 2023-06-02 DIAGNOSIS — Z7984 Long term (current) use of oral hypoglycemic drugs: Secondary | ICD-10-CM | POA: Insufficient documentation

## 2023-06-02 DIAGNOSIS — K219 Gastro-esophageal reflux disease without esophagitis: Secondary | ICD-10-CM | POA: Insufficient documentation

## 2023-06-02 DIAGNOSIS — Z87442 Personal history of urinary calculi: Secondary | ICD-10-CM | POA: Diagnosis not present

## 2023-06-02 DIAGNOSIS — Z8616 Personal history of COVID-19: Secondary | ICD-10-CM | POA: Insufficient documentation

## 2023-06-02 DIAGNOSIS — M8580 Other specified disorders of bone density and structure, unspecified site: Secondary | ICD-10-CM | POA: Insufficient documentation

## 2023-06-02 DIAGNOSIS — Z9011 Acquired absence of right breast and nipple: Secondary | ICD-10-CM | POA: Diagnosis not present

## 2023-06-02 DIAGNOSIS — Z79899 Other long term (current) drug therapy: Secondary | ICD-10-CM | POA: Diagnosis not present

## 2023-06-02 DIAGNOSIS — E119 Type 2 diabetes mellitus without complications: Secondary | ICD-10-CM | POA: Diagnosis not present

## 2023-06-02 DIAGNOSIS — I10 Essential (primary) hypertension: Secondary | ICD-10-CM | POA: Diagnosis not present

## 2023-06-02 DIAGNOSIS — D0511 Intraductal carcinoma in situ of right breast: Secondary | ICD-10-CM

## 2023-06-02 DIAGNOSIS — R7401 Elevation of levels of liver transaminase levels: Secondary | ICD-10-CM | POA: Diagnosis not present

## 2023-06-02 DIAGNOSIS — Z79811 Long term (current) use of aromatase inhibitors: Secondary | ICD-10-CM | POA: Insufficient documentation

## 2023-06-02 DIAGNOSIS — E785 Hyperlipidemia, unspecified: Secondary | ICD-10-CM | POA: Insufficient documentation

## 2023-06-02 LAB — CMP (CANCER CENTER ONLY)
ALT: 58 U/L — ABNORMAL HIGH (ref 0–44)
AST: 46 U/L — ABNORMAL HIGH (ref 15–41)
Albumin: 4.2 g/dL (ref 3.5–5.0)
Alkaline Phosphatase: 101 U/L (ref 38–126)
Anion gap: 6 (ref 5–15)
BUN: 9 mg/dL (ref 8–23)
CO2: 32 mmol/L (ref 22–32)
Calcium: 9.7 mg/dL (ref 8.9–10.3)
Chloride: 99 mmol/L (ref 98–111)
Creatinine: 0.8 mg/dL (ref 0.44–1.00)
GFR, Estimated: >60 mL/min (ref >60)
Glucose, Bld: 85 mg/dL (ref 70–99)
Potassium: 3.2 mmol/L — ABNORMAL LOW (ref 3.5–5.1)
Sodium: 137 mmol/L (ref 135–145)
Total Bilirubin: 0.6 mg/dL (ref 0.0–1.2)
Total Protein: 7.4 g/dL (ref 6.5–8.1)

## 2023-06-02 LAB — CBC WITH DIFFERENTIAL (CANCER CENTER ONLY)
Abs Immature Granulocytes: 0.01 10*3/uL (ref 0.00–0.07)
Basophils Absolute: 0 10*3/uL (ref 0.0–0.1)
Basophils Relative: 1 %
Eosinophils Absolute: 0.2 10*3/uL (ref 0.0–0.5)
Eosinophils Relative: 3 %
HCT: 42.9 % (ref 36.0–46.0)
Hemoglobin: 14.2 g/dL (ref 12.0–15.0)
Immature Granulocytes: 0 %
Lymphocytes Relative: 42 %
Lymphs Abs: 2.7 10*3/uL (ref 0.7–4.0)
MCH: 29 pg (ref 26.0–34.0)
MCHC: 33.1 g/dL (ref 30.0–36.0)
MCV: 87.6 fL (ref 80.0–100.0)
Monocytes Absolute: 0.8 10*3/uL (ref 0.1–1.0)
Monocytes Relative: 12 %
Neutro Abs: 2.7 10*3/uL (ref 1.7–7.7)
Neutrophils Relative %: 42 %
Platelet Count: 198 10*3/uL (ref 150–400)
RBC: 4.9 MIL/uL (ref 3.87–5.11)
RDW: 13.3 % (ref 11.5–15.5)
WBC Count: 6.4 10*3/uL (ref 4.0–10.5)
nRBC: 0 % (ref 0.0–0.2)

## 2023-06-02 MED ORDER — POTASSIUM CHLORIDE CRYS ER 10 MEQ PO TBCR: 10.0000 meq | EXTENDED_RELEASE_TABLET | Freq: Every day | ORAL | 0 refills | Status: DC

## 2023-06-02 NOTE — Progress Notes (Signed)
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 -She underwent a right mastectomy on July 31, 2022.  I reviewed her surgical pathology findings with her in detail, it showed 6 mm invasive ductal carcinoma, grade 2, and DCIS.  Surgical margins were negative, lymph nodes were negative. -We discussed her Oncotype results, which showed recurrence score of 21.  This is considered low risk disease, adjuvant chemotherapy is not recommended. -She does not need postmastectomy radiation  -She started adjuvant anastrozole in July 2024, she stopped in August 2024 due to mood swings and depression. She started Tamoxifen 10mg  dialy in Sep 2024, held in 02/2023 due to transaminitis, and to restart in December after liver function improved.     Assessment and Plan    Breast Cancer 68 year old female with breast cancer, scheduled for final surgery (implant and maxoplasty) on June 17, 2023. Previously experienced elevated liver enzymes on tamoxifen, which normalized after discontinuation. Poor response to anastrozole with severe depression. Discussed alternative medications, including exemestane, with potential side effects: hot flashes, mood swings, joint pain, and impact on bone density.  Osteopenia is a consideration. Recurrence risk with tamoxifen is 7% over nine years; without tamoxifen, probably 10-12%. -I discussed other options. Consider starting exemestane after recovery from surgery if she decides to proceed. - Monitor liver function closely if tamoxifen is retried. - Continue annual mammograms and biannual follow-ups. - Discuss potential use of antidepressants if exemestane is initiated.  Osteopenia Osteopenia with potential negative impact of anti-estrogen therapies on bone health. - Continue calcium and vitamin D supplementation. - Consider adding vitamin K to aid calcium absorption. - Repeat bone density scan in May 2025.  Hypokalemia Potassium level at 3.2, likely due to hydrochlorothiazide use. Not on potassium supplements. - Prescribe a one to two-week supply of potassium supplements. - Encourage dietary intake of potassium-rich foods such as bananas and orange juice.  General Health Maintenance Up to date with mammogram (January 2025) and bone density scan (May 2024). - Schedule next mammogram in January 2026. - Schedule next bone density scan in May 2025.  Plan -I encouraged her to try tamoxifen again, or switch to exemestane.  She will think about it and let me know - Schedule follow-up appointment in six months with the nurse practitioner. - Contact clinic if she decides to start exemestane or needs further assistance.         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, estrogen receptor positive (HCC)  05/14/2022 Cancer Staging   Staging form:  Breast, AJCC 8th Edition - Clinical stage from 05/14/2022: Stage 0 (cTis (DCIS), cN0, cM0, G2, ER+, PR+, HER2: Not Assessed) - Signed by Malachy Mood, MD on 05/27/2022 Stage prefix: Initial diagnosis Histologic grading system: 3 grade system   07/31/2022 Cancer Staging   Staging form: Breast, AJCC 8th Edition - Pathologic stage from 07/31/2022: Stage IA (pT1b, pN0, cM0, G2, ER+, PR+, HER2-) - Signed by Malachy Mood, MD on 08/26/2022 Stage prefix: Initial diagnosis Histologic grading system: 3 grade system   07/31/2022 Surgery   Right mastectomy: 6 mm IDC, grade 2, and DCIS.  Surgical margins were negative, lymph nodes were negative.    07/31/2022 Oncotype testing   21/7%   08/2022 -  Anti-estrogen oral therapy   Anastrozole daily      Discussed the use of AI scribe software for clinical note transcription with the patient, who gave verbal consent to proceed.  History of Present Illness   Meghan Mcdonald, a 68 year old female with a history of breast cancer, presents for a follow-up visit. She is scheduled for an implant and maxoplasty surgery later this month. She has previously tried tamoxifen and anastrozole as part of her treatment regimen. However, tamoxifen was discontinued due to an elevation in liver enzymes, which returned to baseline after discontinuation. An ultrasound of her liver showed no abnormalities. Anastrozole was stopped due to severe depression. She is currently not on any anti-estrogen therapy and is contemplating whether to try another medication, exemestane. She also reports having osteopenia and is taking calcium and vitamin D supplements. She has a baseline abnormal liver function and is on hydrochlorothiazide, which has caused her potassium levels to drop. She is not currently on any antidepressant medication, although she has taken them in the past.         All other systems were reviewed with the patient and are negative.  MEDICAL HISTORY:  Past Medical History:  Diagnosis Date    Anemia    years ago   Anxiety    Arthritis    Breast cancer (HCC)    Chronic shoulder pain    COVID    x 2 both mild cases   Depression    Diabetes mellitus without complication (HCC)    GERD (gastroesophageal reflux disease)    History of kidney stones    Hyperlipemia    Hypertension    Insomnia    Lichen sclerosus of vulva    Migraine    Overweight    Rapid palpitations    Ventricular tachycardia (paroxysmal) (HCC)     SURGICAL HISTORY: Past Surgical History:  Procedure Laterality Date   BLADDER SURGERY     BREAST IMPLANT REMOVAL Right 09/19/2022   Procedure: REMOVAL RIGHT TISSUE EXPANDER;  Surgeon: Peggye Form, DO;  Location: [facility] OR;  Service: Plastics;  Laterality: Right;   BREAST RECONSTRUCTION WITH PLACEMENT OF TISSUE EXPANDER AND FLEX HD (ACELLULAR HYDRATED DERMIS) Right 07/31/2022   Procedure: IMMEDIATE RIGHT BREAST RECONSTRUCTION WITH PLACEMENT OF TISSUE EXPANDER AND FLEX HD (ACELLULAR HYDRATED DERMIS);  Surgeon: Peggye Form, DO;  Location: [facility];  Service: Plastics;  Laterality: Right;   CARPAL TUNNEL RELEASE     CATARACT EXTRACTION Bilateral    CHOLECYSTECTOMY     LATISSIMUS FLAP TO BREAST Right 03/26/2023   Procedure: right latissimus muscle flap with expander placement;  Surgeon: Peggye Form, DO;  Location: [facility] OR;  Service: Plastics;  Laterality: Right;   MASTECTOMY W/ SENTINEL NODE BIOPSY  Right 07/31/2022   Procedure: RIGHT MASTECTOMY WITH SENTINEL LYMPH NODE BIOPSY;  Surgeon: Griselda Miner, MD;  Location: [facility];  Service: General;  Laterality: Right;   ORIF ANKLE FRACTURE Right 07/22/2018   Procedure: OPEN REDUCTION INTERNAL FIXATION (ORIF) RIGHT BIMALL ANKLE FRACTURE;  Surgeon: Terance Hart, MD;  Location: [facility];  Service: Orthopedics;  Laterality: Right;   ORIF TIBIA & FIBULA FRACTURES     TONSILLECTOMY      I have reviewed the social history and family history  with the patient and they are unchanged from previous note.  ALLERGIES:  is allergic to hibiclens [chlorhexidine gluconate], prednisone, aspirin, micardis [telmisartan], amitriptyline, and loestrin [norethindrone acet-ethinyl est].  MEDICATIONS:  Current Outpatient Medications  Medication Sig Dispense Refill   potassium chloride (KLOR-CON M) 10 MEQ tablet Take 1 tablet (10 mEq total) by mouth daily. 14 tablet 0   acetaminophen (TYLENOL) 500 MG tablet Take 1,000 mg by mouth as needed for moderate pain.     atorvastatin (LIPITOR) 80 MG tablet Take 1 tablet (80 mg total) by mouth daily at 6 PM. 30 tablet 0   Azelastine HCl 137 MCG/SPRAY SOLN Place 1 spray into both nostrils 2 (two) times daily as needed (allergies).     Calcium Carb-Cholecalciferol (CALCIUM 600 + D PO) Take 1 tablet by mouth daily.     calcium elemental as carbonate (TUMS ULTRA 1000) 400 MG chewable tablet Chew 1,000 mg by mouth daily as needed for heartburn.     clobetasol ointment (TEMOVATE) 0.05 % Apply 1 Application topically 2 (two) times a week.     diphenhydrAMINE (BENADRYL) 25 MG tablet Take 50 mg by mouth at bedtime.     hydrochlorothiazide (HYDRODIURIL) 25 MG tablet Take 12.5 mg by mouth daily.     Iron, Ferrous Sulfate, 325 (65 Fe) MG TABS Take 325 mg by mouth daily for 7 days. 7 tablet 0   meloxicam (MOBIC) 15 MG tablet Take 15 mg by mouth daily as needed (inflammation/pain.).     metFORMIN (GLUCOPHAGE-XR) 500 MG 24 hr tablet Take 1,000 mg by mouth daily with supper.     metoprolol tartrate (LOPRESSOR) 50 MG tablet Take 50 mg by mouth 2 (two) times daily.     Misc Natural Products ([employer] SLEEP SUPPORT PO) Take 1 tablet by mouth at bedtime.     Multiple Vitamins-Minerals (PRESERVISION AREDS 2) CAPS Take 1 capsule by mouth daily.     naproxen sodium (ALEVE) 220 MG tablet Take 220 mg by mouth 2 (two) times daily as needed (pain).     Omega-3 Fatty Acids (FISH OIL) 1000 MG CAPS Take 1,000 mg by mouth every evening.      ondansetron (ZOFRAN) 4 MG tablet Take 1 tablet (4 mg total) by mouth every 8 (eight) hours as needed for nausea or vomiting. 20 tablet 0   ondansetron (ZOFRAN-ODT) 4 MG disintegrating tablet Take 1 tablet (4 mg total) by mouth every 8 (eight) hours as needed for nausea or vomiting. 20 tablet 0   oxyCODONE (ROXICODONE) 5 MG immediate release tablet Take 1 tablet (5 mg total) by mouth every 8 (eight) hours as needed for up to 7 days for severe pain (pain score 7-10). 20 tablet 0   ramipril (ALTACE) 10 MG capsule Take 10 mg by mouth 2 (two) times daily.     No current facility-administered medications for this visit.    PHYSICAL EXAMINATION: ECOG PERFORMANCE STATUS: 0 - Asymptomatic  Vitals:   06/02/23  1411  BP: 133/82  Pulse: 76  Resp: 16  Temp: 97.6 F (36.4 C)  SpO2: 99%   Wt Readings from Last 3 Encounters:  06/02/23 64.5 kg  05/28/23 63 kg  05/22/23 62.6 kg     GENERAL:alert, no distress and comfortable SKIN: skin color, texture, turgor are normal, no rashes or significant lesions EYES: normal, Conjunctiva are pink and non-injected, sclera clear Musculoskeletal:no cyanosis of digits and no clubbing  NEURO: alert & oriented x 3 with fluent speech, no focal motor/sensory deficits    LABORATORY DATA:  I have reviewed the data as listed    Latest Ref Rng & Units 06/02/2023    1:22 PM 04/14/2023    9:58 AM 03/29/2023    7:40 AM  CBC  WBC 4.0 - 10.5 K/uL 6.4  4.4  5.8   Hemoglobin 12.0 - 15.0 g/dL 16.1  09.6  04.5   Hematocrit 36.0 - 46.0 % 42.9  38.1  32.2   Platelets 150 - 400 K/uL 198  211  152         Latest Ref Rng & Units 06/02/2023    1:22 PM 04/14/2023    9:58 AM 03/03/2023   12:53 PM  CMP  Glucose 70 - 99 mg/dL 85  409  811   BUN 8 - 23 mg/dL 9  13  11    Creatinine 0.44 - 1.00 mg/dL 9.14  7.82  9.56   Sodium 135 - 145 mmol/L 137  141  141   Potassium 3.5 - 5.1 mmol/L 3.2  3.9  3.8   Chloride 98 - 111 mmol/L 99  108  103   CO2 22 - 32 mmol/L 32  29  31    Calcium 8.9 - 10.3 mg/dL 9.7  9.2  9.6   Total Protein 6.5 - 8.1 g/dL 7.4  6.9  7.1   Total Bilirubin 0.0 - 1.2 mg/dL 0.6  0.6  0.9   Alkaline Phos 38 - 126 U/L 101  79  108   AST 15 - 41 U/L 46  50  185   ALT 0 - 44 U/L 58  71  294       RADIOGRAPHIC STUDIES: I have personally reviewed the radiological images as listed and agreed with the findings in the report. No results found.    No orders of the defined types were placed in this encounter.  All questions were answered. The patient knows to call the clinic with any problems, questions or concerns. No barriers to learning was detected. The total time spent in the appointment was 25 minutes.     Malachy Mood, MD 06/02/2023

## 2023-06-02 NOTE — Addendum Note (Signed)
Addended by: Peggye Form on: 06/02/2023 03:32 PM   Modules accepted: Orders

## 2023-06-08 NOTE — Progress Notes (Signed)
 Surgery orders requested via Epic inbox.

## 2023-06-12 NOTE — Progress Notes (Addendum)
 Anesthesia Review:  PCP: Myles Lipps Cardiologist : none  Chest x-ray : EKG : 06/15/23  Echo : 2019  Stress test: Cardiac Cath :  Activity level: can do a flight of stairs without difficutly  Sleep Study/ CPAP : none  Fasting Blood Sugar :      / Checks Blood Sugar -- times a day:   Blood Thinner/ Instructions /Last Dose: ASA / Instructions/ Last Dose :    DM- type 2 Hgba1c-  06/15/23 -6.4  Metformin- none day of surgery    06/02/23- cbc/diff and CMP  Labs repeated on 06/15/23.    No orders in epic at time of preop .  Called and spoke with Herbert Seta in Surgery Scheduling and requested orders.   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

## 2023-06-12 NOTE — Patient Instructions (Signed)
SURGICAL WAITING ROOM VISITATION  Patients having surgery or a procedure may have no more than 2 support people in the waiting area - these visitors may rotate.    Children under the age of 76 must have an adult with them who is not the patient.  Due to an increase in RSV and influenza rates and associated hospitalizations, children ages 42 and under may not visit patients in [facility] hospitals.  Visitors with respiratory illnesses are discouraged from visiting and should remain at home.  If the patient needs to stay at the hospital during part of their recovery, the visitor guidelines for inpatient rooms apply. Pre-op nurse will coordinate an appropriate time for 1 support person to accompany patient in pre-op.  This support person may not rotate.    Please refer to the [facility] website for the visitor guidelines for Inpatients (after your surgery is over and you are in a regular room).       Your procedure is scheduled on:  06/17/23    Report to [facility] Main Entrance    Report to admitting at   [phone]   Call this number if you have problems the morning of surgery [phone]   Do not eat food  or drink liquids :After Midnight.                If you have questions, please contact your surgeon's office.      Oral Hygiene is also important to reduce your risk of infection.                                    Remember - BRUSH YOUR TEETH THE MORNING OF SURGERY WITH YOUR REGULAR TOOTHPASTE  DENTURES WILL BE REMOVED PRIOR TO SURGERY PLEASE DO NOT APPLY "Poly grip" OR ADHESIVES!!!   Do NOT smoke after Midnight   Stop all vitamins and herbal supplements 7 days before surgery.   Take these medicines the morning of surgery with A SIP OF WATER:  metoprolol          Metformin - none am of surgery   DO NOT TAKE ANY ORAL DIABETIC MEDICATIONS DAY OF YOUR SURGERY  Bring CPAP mask and tubing day of surgery.                              You may not have any metal  on your body including hair pins, jewelry, and body piercing             Do not wear make-up, lotions, powders, perfumes/cologne, or deodorant  Do not wear nail polish including gel and S&S, artificial/acrylic nails, or any other type of covering on natural nails including finger and toenails. If you have artificial nails, gel coating, etc. that needs to be removed by a nail salon please have this removed prior to surgery or surgery may need to be canceled/ delayed if the surgeon/ anesthesia feels like they are unable to be safely monitored.   Do not shave  48 hours prior to surgery.               Men may shave face and neck.   Do not bring valuables to the hospital. [facility] IS NOT             RESPONSIBLE   FOR VALUABLES.   Contacts, glasses, dentures or bridgework  may not be worn into surgery.   Bring small overnight bag day of surgery.   DO NOT BRING YOUR HOME MEDICATIONS TO THE HOSPITAL. PHARMACY WILL DISPENSE MEDICATIONS LISTED ON YOUR MEDICATION LIST TO YOU DURING YOUR ADMISSION IN THE HOSPITAL!    Patients discharged on the day of surgery will not be allowed to drive home.  Someone NEEDS to stay with you for the first 24 hours after anesthesia.   Special Instructions: Bring a copy of your healthcare power of attorney and living will documents the day of surgery if you haven't scanned them before.              Please read over the following fact sheets you were given: IF YOU HAVE QUESTIONS ABOUT YOUR PRE-OP INSTRUCTIONS PLEASE CALL [phone]   If you received a COVID test during your pre-op visit  it is requested that you wear a mask when out in public, stay away from anyone that may not be feeling well and notify your surgeon if you develop symptoms. If you test positive for Covid or have been in contact with anyone that has tested positive in the last 10 days please notify you surgeon.    [facility] - Preparing for Surgery Before surgery, you can play an important role.   Because skin is not sterile, your skin needs to be as free of germs as possible.  You can reduce the number of germs on your skin by washing with CHG (chlorahexidine gluconate) soap before surgery.  CHG is an antiseptic cleaner which kills germs and bonds with the skin to continue killing germs even after washing. Please DO NOT use if you have an allergy to CHG or antibacterial soaps.  If your skin becomes reddened/irritated stop using the CHG and inform your nurse when you arrive at Short Stay. Do not shave (including legs and underarms) for at least 48 hours prior to the first CHG shower.  You may shave your face/neck. Please follow these instructions carefully:  1.  Shower with CHG Soap the night before surgery and the  morning of Surgery.  2.  If you choose to wash your hair, wash your hair first as usual with your  normal  shampoo.  3.  After you shampoo, rinse your hair and body thoroughly to remove the  shampoo.                           4.  Use CHG as you would any other liquid soap.  You can apply chg directly  to the skin and wash                       Gently with a scrungie or clean washcloth.  5.  Apply the CHG Soap to your body ONLY FROM THE NECK DOWN.   Do not use on face/ open                           Wound or open sores. Avoid contact with eyes, ears mouth and genitals (private parts).                       Wash face,  Genitals (private parts) with your normal soap.             6.  Wash thoroughly, paying special attention to the area where your surgery  will be  performed.  7.  Thoroughly rinse your body with warm water from the neck down.  8.  DO NOT shower/wash with your normal soap after using and rinsing off  the CHG Soap.                9.  Pat yourself dry with a clean towel.            10.  Wear clean pajamas.            11.  Place clean sheets on your bed the night of your first shower and do not  sleep with pets. Day of Surgery : Do not apply any lotions/deodorants the  morning of surgery.  Please wear clean clothes to the hospital/surgery center.  FAILURE TO FOLLOW THESE INSTRUCTIONS MAY RESULT IN THE CANCELLATION OF YOUR SURGERY PATIENT SIGNATURE_________________________________  NURSE SIGNATURE__________________________________  ________________________________________________________________________

## 2023-06-12 NOTE — Progress Notes (Signed)
Second request for pre op orders left voicemail.

## 2023-06-15 ENCOUNTER — Encounter (HOSPITAL_COMMUNITY)
Admission: RE | Admit: 2023-06-15 | Discharge: 2023-06-15 | Disposition: A | Discharge status: Home / Self Care | Payer: Medicare HMO | Source: Ambulatory Visit | Attending: Plastic Surgery

## 2023-06-15 ENCOUNTER — Encounter (HOSPITAL_COMMUNITY): Payer: Self-pay

## 2023-06-15 ENCOUNTER — Other Ambulatory Visit: Payer: Self-pay

## 2023-06-15 VITALS — BP 155/90 | HR 62 | Temp 98.2°F | Resp 16 | Ht 62.0 in | Wt 139.0 lb

## 2023-06-15 DIAGNOSIS — E119 Type 2 diabetes mellitus without complications: Secondary | ICD-10-CM | POA: Insufficient documentation

## 2023-06-15 DIAGNOSIS — Z01818 Encounter for other preprocedural examination: Secondary | ICD-10-CM | POA: Diagnosis not present

## 2023-06-15 HISTORY — DX: Nausea with vomiting, unspecified: R11.2

## 2023-06-15 HISTORY — DX: Other specified postprocedural states: Z98.890

## 2023-06-15 LAB — BASIC METABOLIC PANEL
Anion gap: 8 (ref 5–15)
BUN: 15 mg/dL (ref 8–23)
CO2: 25 mmol/L (ref 22–32)
Calcium: 9.6 mg/dL (ref 8.9–10.3)
Chloride: 106 mmol/L (ref 98–111)
Creatinine, Ser: 0.66 mg/dL (ref 0.44–1.00)
GFR, Estimated: >60 mL/min (ref >60)
Glucose, Bld: 110 mg/dL — ABNORMAL HIGH (ref 70–99)
Potassium: 3.9 mmol/L (ref 3.5–5.1)
Sodium: 139 mmol/L (ref 135–145)

## 2023-06-15 LAB — HEMOGLOBIN A1C
Hgb A1c MFr Bld: 6.4 % — ABNORMAL HIGH (ref 4.8–5.6)
Mean Plasma Glucose: 136.98 mg/dL

## 2023-06-15 LAB — CBC
HCT: 44.5 % (ref 36.0–46.0)
Hemoglobin: 14.3 g/dL (ref 12.0–15.0)
MCH: 28.5 pg (ref 26.0–34.0)
MCHC: 32.1 g/dL (ref 30.0–36.0)
MCV: 88.6 fL (ref 80.0–100.0)
Platelets: 185 10*3/uL (ref 150–400)
RBC: 5.02 MIL/uL (ref 3.87–5.11)
RDW: 13.7 % (ref 11.5–15.5)
WBC: 5 10*3/uL (ref 4.0–10.5)
nRBC: 0 % (ref 0.0–0.2)

## 2023-06-15 LAB — GLUCOSE, CAPILLARY: Glucose-Capillary: 115 mg/dL — ABNORMAL HIGH (ref 70–99)

## 2023-06-15 NOTE — Anesthesia Preprocedure Evaluation (Signed)
 Anesthesia Evaluation  Patient identified by MRN, date of birth, ID band Patient awake    Reviewed: Allergy & Precautions, H&P , NPO status , Patient's Chart, lab work & pertinent test results, reviewed documented beta blocker date and time   History of Anesthesia Complications (+) PONV and history of anesthetic complications  Airway Mallampati: II  TM Distance: >3 FB Neck ROM: Full    Dental no notable dental hx. (+) Dental Advisory Given, Teeth Intact   Pulmonary neg pulmonary ROS   Pulmonary exam normal breath sounds clear to auscultation       Cardiovascular hypertension, Pt. on home beta blockers Normal cardiovascular exam Rhythm:Regular Rate:Normal  TTE 12/23/2017: - Left ventricle: The cavity size was normal. Systolic function was normal. The estimated ejection fraction was in the range of 60% to 65%. Wall motion was normal; there were no regional wall motion abnormalities. The study is indeterminate for the evaluation of LV diastolic function.  - Aortic valve: There was mild regurgitation.  - Mitral valve: There was no significant regurgitation.  - Right ventricle: Systolic function was normal.  - Atrial septum: Poorly visualized. A patent foramen ovale cannot be excluded.  - Tricuspid valve: There was trivial regurgitation.  - Pulmonic valve: There was no significant regurgitation.   Impressions:  - Normal LV systolic function without significant valve disease. No clear source of emboli. Atrial septum not well visualized; shunt or PFO cannot be completely excluded.       Neuro/Psych  Headaches PSYCHIATRIC DISORDERS Anxiety Depression    CVA    GI/Hepatic ,GERD  Controlled,,  Endo/Other  diabetes, Type 2    Renal/GU      Musculoskeletal  (+) Arthritis ,    Abdominal   Peds  Hematology  (+) Blood dyscrasia, anemia   Anesthesia Other Findings   Reproductive/Obstetrics Breast CA                              Anesthesia Physical Anesthesia Plan  ASA: 2  Anesthesia Plan: General   Post-op Pain Management: Tylenol PO (pre-op)* and Gabapentin PO (pre-op)*   Induction: Intravenous  PONV Risk Score and Plan: 4 or greater and Ondansetron, Dexamethasone, Midazolam, Treatment may vary due to age or medical condition, Propofol infusion and TIVA  Airway Management Planned: Oral ETT  Additional Equipment:   Intra-op Plan:   Post-operative Plan: Extubation in OR  Informed Consent: I have reviewed the patients History and Physical, chart, labs and discussed the procedure including the risks, benefits and alternatives for the proposed anesthesia with the patient or authorized representative who has indicated his/her understanding and acceptance.     Dental advisory given  Plan Discussed with: CRNA  Anesthesia Plan Comments: (68 year old female with pertinent history including HTN, GERD, non-insulin-dependent DM2 (A1c 6.21 03/24/2023), migraines, palpitations, breast cancer. Reviewed 03/24/23 by Antionette Poles PA-C prior to right breast flap with tissue expander placement. No significant updates to medical hx.  Risks of anesthesia explained at length. This includes, but is not limited to, sore throat, damage to teeth, lips gums, tongue and vocal cords, nausea and vomiting, reactions to medications, stroke, heart attack, and death. All patient questions were answered and the patient wishes to proceed. )       Anesthesia Quick Evaluation

## 2023-06-17 ENCOUNTER — Ambulatory Visit (HOSPITAL_COMMUNITY)
Admission: RE | Admit: 2023-06-17 | Discharge: 2023-06-17 | Disposition: A | Discharge status: Home / Self Care | Payer: Medicare HMO | Attending: Plastic Surgery | Admitting: Plastic Surgery

## 2023-06-17 ENCOUNTER — Encounter (HOSPITAL_COMMUNITY): Payer: Self-pay | Admitting: Plastic Surgery

## 2023-06-17 ENCOUNTER — Ambulatory Visit (HOSPITAL_BASED_OUTPATIENT_CLINIC_OR_DEPARTMENT_OTHER): Payer: Self-pay | Admitting: Anesthesiology

## 2023-06-17 ENCOUNTER — Other Ambulatory Visit: Payer: Self-pay

## 2023-06-17 ENCOUNTER — Encounter (HOSPITAL_COMMUNITY)
Admission: RE | Disposition: A | Discharge status: Home / Self Care | Payer: Self-pay | Source: Home / Self Care | Attending: Plastic Surgery

## 2023-06-17 ENCOUNTER — Ambulatory Visit (HOSPITAL_COMMUNITY): Payer: Self-pay | Admitting: Medical

## 2023-06-17 DIAGNOSIS — Z421 Encounter for breast reconstruction following mastectomy: Secondary | ICD-10-CM

## 2023-06-17 DIAGNOSIS — I639 Cerebral infarction, unspecified: Secondary | ICD-10-CM

## 2023-06-17 DIAGNOSIS — N651 Disproportion of reconstructed breast: Secondary | ICD-10-CM

## 2023-06-17 DIAGNOSIS — Z9221 Personal history of antineoplastic chemotherapy: Secondary | ICD-10-CM | POA: Diagnosis not present

## 2023-06-17 DIAGNOSIS — Z853 Personal history of malignant neoplasm of breast: Secondary | ICD-10-CM

## 2023-06-17 DIAGNOSIS — Z79899 Other long term (current) drug therapy: Secondary | ICD-10-CM | POA: Insufficient documentation

## 2023-06-17 DIAGNOSIS — I10 Essential (primary) hypertension: Secondary | ICD-10-CM | POA: Diagnosis not present

## 2023-06-17 DIAGNOSIS — E119 Type 2 diabetes mellitus without complications: Secondary | ICD-10-CM | POA: Insufficient documentation

## 2023-06-17 DIAGNOSIS — Z7984 Long term (current) use of oral hypoglycemic drugs: Secondary | ICD-10-CM | POA: Insufficient documentation

## 2023-06-17 DIAGNOSIS — N6489 Other specified disorders of breast: Secondary | ICD-10-CM | POA: Diagnosis present

## 2023-06-17 DIAGNOSIS — D649 Anemia, unspecified: Secondary | ICD-10-CM | POA: Diagnosis not present

## 2023-06-17 DIAGNOSIS — M1990 Unspecified osteoarthritis, unspecified site: Secondary | ICD-10-CM | POA: Insufficient documentation

## 2023-06-17 DIAGNOSIS — Z8249 Family history of ischemic heart disease and other diseases of the circulatory system: Secondary | ICD-10-CM | POA: Insufficient documentation

## 2023-06-17 DIAGNOSIS — Z9011 Acquired absence of right breast and nipple: Secondary | ICD-10-CM | POA: Insufficient documentation

## 2023-06-17 DIAGNOSIS — Z01818 Encounter for other preprocedural examination: Secondary | ICD-10-CM

## 2023-06-17 DIAGNOSIS — E785 Hyperlipidemia, unspecified: Secondary | ICD-10-CM | POA: Insufficient documentation

## 2023-06-17 HISTORY — PX: REMOVAL OF TISSUE EXPANDER AND PLACEMENT OF IMPLANT: SHX6457

## 2023-06-17 LAB — GLUCOSE, CAPILLARY
Glucose-Capillary: 102 mg/dL — ABNORMAL HIGH (ref 70–99)
Glucose-Capillary: 151 mg/dL — ABNORMAL HIGH (ref 70–99)

## 2023-06-17 SURGERY — REMOVAL, TISSUE EXPANDER, BREAST, WITH IMPLANT INSERTION
Anesthesia: General | Site: Breast | Laterality: Right

## 2023-06-17 MED ORDER — LIDOCAINE HCL (PF) 2 % IJ SOLN
INTRAMUSCULAR | Status: DC | PRN
  Administered 2023-06-17: 50 mg via INTRADERMAL

## 2023-06-17 MED ORDER — LIDOCAINE-EPINEPHRINE 1 %-1:100000 IJ SOLN
INTRAMUSCULAR | Status: DC | PRN
  Administered 2023-06-17: 9 mL

## 2023-06-17 MED ORDER — BUPIVACAINE HCL (PF) 0.25 % IJ SOLN
INTRAMUSCULAR | Status: DC | PRN
  Administered 2023-06-17: 9 mL

## 2023-06-17 MED ORDER — OXYCODONE HCL 5 MG PO TABS: 5.0000 mg | ORAL_TABLET | Freq: Once | ORAL | Status: DC | PRN

## 2023-06-17 MED ORDER — VASHE WOUND IRRIGATION OPTIME
TOPICAL | Status: DC | PRN
  Administered 2023-06-17: 34 [oz_av] via TOPICAL

## 2023-06-17 MED ORDER — OXYCODONE HCL 5 MG/5ML PO SOLN: 5.0000 mg | Freq: Once | ORAL | Status: DC | PRN

## 2023-06-17 MED ORDER — LIDOCAINE HCL (PF) 2 % IJ SOLN
INTRAMUSCULAR | Status: AC
  Filled 2023-06-17: qty 5

## 2023-06-17 MED ORDER — SUGAMMADEX SODIUM 200 MG/2ML IV SOLN
INTRAVENOUS | Status: AC
  Filled 2023-06-17: qty 2

## 2023-06-17 MED ORDER — LACTATED RINGERS IV SOLN
INTRAVENOUS | Status: DC
Start: 2023-06-17 — End: 2023-06-17

## 2023-06-17 MED ORDER — BUPIVACAINE HCL (PF) 0.25 % IJ SOLN
INTRAMUSCULAR | Status: AC
  Filled 2023-06-17: qty 30

## 2023-06-17 MED ORDER — SUGAMMADEX SODIUM 200 MG/2ML IV SOLN
INTRAVENOUS | Status: DC | PRN
  Administered 2023-06-17: 200 mg via INTRAVENOUS

## 2023-06-17 MED ORDER — ACETAMINOPHEN 650 MG RE SUPP: 650.0000 mg | RECTAL | Status: DC | PRN

## 2023-06-17 MED ORDER — CHLORHEXIDINE GLUCONATE 0.12 % MT SOLN: 15.0000 mL | Freq: Once | OROMUCOSAL | Status: DC

## 2023-06-17 MED ORDER — SODIUM CHLORIDE 0.9% FLUSH: 3.0000 mL | Freq: Two times a day (BID) | INTRAVENOUS | Status: DC

## 2023-06-17 MED ORDER — PROPOFOL 1000 MG/100ML IV EMUL
INTRAVENOUS | Status: AC
  Filled 2023-06-17: qty 100

## 2023-06-17 MED ORDER — DROPERIDOL 2.5 MG/ML IJ SOLN
INTRAMUSCULAR | Status: AC
  Administered 2023-06-17: 0.625 mg via INTRAVENOUS
  Filled 2023-06-17: qty 2

## 2023-06-17 MED ORDER — SODIUM CHLORIDE (PF) 0.9 % IJ SOLN
INTRAMUSCULAR | Status: AC
  Filled 2023-06-17: qty 10

## 2023-06-17 MED ORDER — LIDOCAINE-EPINEPHRINE 1 %-1:100000 IJ SOLN
INTRAMUSCULAR | Status: AC
  Filled 2023-06-17: qty 1

## 2023-06-17 MED ORDER — DROPERIDOL 2.5 MG/ML IJ SOLN: 0.6250 mg | Freq: Once | INTRAMUSCULAR | Status: AC | PRN

## 2023-06-17 MED ORDER — LACTATED RINGERS IV SOLN: INTRAVENOUS | Status: DC | PRN

## 2023-06-17 MED ORDER — MIDAZOLAM HCL 2 MG/2ML IJ SOLN
INTRAMUSCULAR | Status: DC | PRN
  Administered 2023-06-17: 2 mg via INTRAVENOUS

## 2023-06-17 MED ORDER — ROCURONIUM BROMIDE 10 MG/ML (PF) SYRINGE
PREFILLED_SYRINGE | INTRAVENOUS | Status: DC | PRN
  Administered 2023-06-17: 20 mg via INTRAVENOUS
  Administered 2023-06-17: 50 mg via INTRAVENOUS

## 2023-06-17 MED ORDER — ONDANSETRON HCL 4 MG/2ML IJ SOLN
INTRAMUSCULAR | Status: AC
  Filled 2023-06-17: qty 2

## 2023-06-17 MED ORDER — PROPOFOL 500 MG/50ML IV EMUL
INTRAVENOUS | Status: DC | PRN
Start: 2023-06-17 — End: 2023-06-17
  Administered 2023-06-17: 100 ug/kg/min via INTRAVENOUS
  Administered 2023-06-17: 120 mg via INTRAVENOUS

## 2023-06-17 MED ORDER — DEXAMETHASONE SODIUM PHOSPHATE 10 MG/ML IJ SOLN
INTRAMUSCULAR | Status: DC | PRN
  Administered 2023-06-17: 8 mg via INTRAVENOUS

## 2023-06-17 MED ORDER — OXYCODONE HCL 5 MG PO TABS: 5.0000 mg | ORAL_TABLET | ORAL | Status: DC | PRN

## 2023-06-17 MED ORDER — CEFAZOLIN SODIUM-DEXTROSE 2-4 GM/100ML-% IV SOLN
2.0000 g | INTRAVENOUS | Status: AC
  Administered 2023-06-17: 2 g via INTRAVENOUS
  Filled 2023-06-17: qty 100

## 2023-06-17 MED ORDER — FENTANYL CITRATE (PF) 100 MCG/2ML IJ SOLN
INTRAMUSCULAR | Status: DC | PRN
  Administered 2023-06-17: 100 ug via INTRAVENOUS

## 2023-06-17 MED ORDER — ORAL CARE MOUTH RINSE: 15.0000 mL | Freq: Once | OROMUCOSAL | Status: DC

## 2023-06-17 MED ORDER — INSULIN ASPART 100 UNIT/ML IJ SOLN: 0.0000 [IU] | INTRAMUSCULAR | Status: DC | PRN

## 2023-06-17 MED ORDER — PHENYLEPHRINE 80 MCG/ML (10ML) SYRINGE FOR IV PUSH (FOR BLOOD PRESSURE SUPPORT)
PREFILLED_SYRINGE | INTRAVENOUS | Status: AC
  Filled 2023-06-17: qty 10

## 2023-06-17 MED ORDER — ACETAMINOPHEN 500 MG PO TABS
1000.0000 mg | ORAL_TABLET | Freq: Once | ORAL | Status: AC
  Administered 2023-06-17: 1000 mg via ORAL
  Filled 2023-06-17: qty 2

## 2023-06-17 MED ORDER — FENTANYL CITRATE PF 50 MCG/ML IJ SOSY: 25.0000 ug | PREFILLED_SYRINGE | INTRAMUSCULAR | Status: DC | PRN

## 2023-06-17 MED ORDER — FENTANYL CITRATE (PF) 100 MCG/2ML IJ SOLN
INTRAMUSCULAR | Status: AC
  Filled 2023-06-17: qty 2

## 2023-06-17 MED ORDER — LACTATED RINGERS IV SOLN: INTRAVENOUS | Status: DC

## 2023-06-17 MED ORDER — PHENYLEPHRINE HCL-NACL 20-0.9 MG/250ML-% IV SOLN
INTRAVENOUS | Status: AC
  Filled 2023-06-17: qty 250

## 2023-06-17 MED ORDER — KETAMINE HCL 50 MG/5ML IJ SOSY
PREFILLED_SYRINGE | INTRAMUSCULAR | Status: DC | PRN
  Administered 2023-06-17: 30 mg via INTRAVENOUS

## 2023-06-17 MED ORDER — ONDANSETRON HCL 4 MG/2ML IJ SOLN
INTRAMUSCULAR | Status: DC | PRN
  Administered 2023-06-17: 4 mg via INTRAVENOUS

## 2023-06-17 MED ORDER — SODIUM CHLORIDE 0.9% FLUSH: 3.0000 mL | INTRAVENOUS | Status: DC | PRN

## 2023-06-17 MED ORDER — GABAPENTIN 300 MG PO CAPS
300.0000 mg | ORAL_CAPSULE | Freq: Once | ORAL | Status: AC
  Administered 2023-06-17: 300 mg via ORAL
  Filled 2023-06-17: qty 1

## 2023-06-17 MED ORDER — BUPIVACAINE LIPOSOME 1.3 % IJ SUSP
INTRAMUSCULAR | Status: DC | PRN
  Administered 2023-06-17: 20 mL

## 2023-06-17 MED ORDER — HYDROMORPHONE HCL 1 MG/ML IJ SOLN: 0.2500 mg | INTRAMUSCULAR | Status: DC | PRN

## 2023-06-17 MED ORDER — BUPIVACAINE-EPINEPHRINE (PF) 0.25% -1:200000 IJ SOLN
INTRAMUSCULAR | Status: AC
  Filled 2023-06-17: qty 30

## 2023-06-17 MED ORDER — ACETAMINOPHEN 325 MG PO TABS: 650.0000 mg | ORAL_TABLET | ORAL | Status: DC | PRN

## 2023-06-17 MED ORDER — MIDAZOLAM HCL 2 MG/2ML IJ SOLN
INTRAMUSCULAR | Status: AC
  Filled 2023-06-17: qty 2

## 2023-06-17 MED ORDER — SODIUM CHLORIDE 0.9 % IV SOLN: 250.0000 mL | INTRAVENOUS | Status: DC | PRN

## 2023-06-17 MED ORDER — ROCURONIUM BROMIDE 10 MG/ML (PF) SYRINGE
PREFILLED_SYRINGE | INTRAVENOUS | Status: AC
  Filled 2023-06-17: qty 10

## 2023-06-17 MED ORDER — KETAMINE HCL 50 MG/5ML IJ SOSY
PREFILLED_SYRINGE | INTRAMUSCULAR | Status: AC
  Filled 2023-06-17: qty 5

## 2023-06-17 MED ORDER — BUPIVACAINE LIPOSOME 1.3 % IJ SUSP
INTRAMUSCULAR | Status: AC
  Filled 2023-06-17: qty 20

## 2023-06-17 MED ORDER — DEXAMETHASONE SODIUM PHOSPHATE 10 MG/ML IJ SOLN
INTRAMUSCULAR | Status: AC
  Filled 2023-06-17: qty 1

## 2023-06-17 SURGICAL SUPPLY — 58 items
BAG DECANTER FOR FLEXI CONT (MISCELLANEOUS) IMPLANT
BINDER BREAST LRG (GAUZE/BANDAGES/DRESSINGS) IMPLANT
BINDER BREAST MEDIUM (GAUZE/BANDAGES/DRESSINGS) IMPLANT
BINDER BREAST XLRG (GAUZE/BANDAGES/DRESSINGS) IMPLANT
BINDER BREAST XXLRG (GAUZE/BANDAGES/DRESSINGS) IMPLANT
BIOPATCH RED 1 DISK 7.0 (GAUZE/BANDAGES/DRESSINGS) IMPLANT
BLADE HEX COATED 2.75 (ELECTRODE) ×2 IMPLANT
BLADE SURG 15 STRL LF DISP TIS (BLADE) ×2 IMPLANT
CLEANSER WND VASHE 34 (WOUND CARE) IMPLANT
CORD BIPOLAR FORCEPS 12FT (ELECTRODE) IMPLANT
DERMABOND ADVANCED .7 DNX12 (GAUZE/BANDAGES/DRESSINGS) IMPLANT
DRAIN CHANNEL 19F RND (DRAIN) IMPLANT
DRAPE LAPAROSCOPIC ABDOMINAL (DRAPES) ×2 IMPLANT
DRESSING MEPILEX FLEX 4X4 (GAUZE/BANDAGES/DRESSINGS) ×2 IMPLANT
DRSG MEPILEX FLEX 4X4 (GAUZE/BANDAGES/DRESSINGS) ×1 IMPLANT
DRSG MEPILEX POST OP 4X8 (GAUZE/BANDAGES/DRESSINGS) ×4 IMPLANT
DRSG TEGADERM 2-3/8X2-3/4 SM (GAUZE/BANDAGES/DRESSINGS) IMPLANT
ELECT BLADE 4.0 EZ CLEAN MEGAD (MISCELLANEOUS) ×1 IMPLANT
ELECT BLADE 6.5 EXT (BLADE) IMPLANT
ELECT BLADE TIP CTD 4 INCH (ELECTRODE) ×2 IMPLANT
ELECT REM PT RETURN 15FT ADLT (MISCELLANEOUS) ×2 IMPLANT
ELECT REM PT RETURN 9FT ADLT (ELECTROSURGICAL) ×1 IMPLANT
ELECTRODE BLDE 4.0 EZ CLN MEGD (MISCELLANEOUS) ×2 IMPLANT
ELECTRODE REM PT RTRN 9FT ADLT (ELECTROSURGICAL) ×2 IMPLANT
EVACUATOR SILICONE 100CC (DRAIN) IMPLANT
FUNNEL KELLER 2 DISP (MISCELLANEOUS) ×2 IMPLANT
GAUZE PAD ABD 8X10 STRL (GAUZE/BANDAGES/DRESSINGS) ×4 IMPLANT
GAUZE SPONGE 4X4 12PLY STRL LF (GAUZE/BANDAGES/DRESSINGS) IMPLANT
GLOVE BIO SURGEON STRL SZ 6.5 (GLOVE) ×4 IMPLANT
GOWN STRL REUS W/ TWL LRG LVL3 (GOWN DISPOSABLE) ×4 IMPLANT
IMPL GEL 275CC SMOOTH HIGH (Breast) IMPLANT
IMPLANT GEL 275CC SMOOTH HIGH (Breast) ×1 IMPLANT
IV NS 1000ML BAXH (IV SOLUTION) IMPLANT
IV NS 500ML BAXH (IV SOLUTION) ×2 IMPLANT
KIT BASIN OR (CUSTOM PROCEDURE TRAY) ×2 IMPLANT
KIT FILL ASEPTIC TRANSFER (MISCELLANEOUS) IMPLANT
NDL HYPO 22X1.5 SAFETY MO (MISCELLANEOUS) IMPLANT
NEEDLE HYPO 22X1.5 SAFETY MO (MISCELLANEOUS) IMPLANT
PACK GENERAL/GYN (CUSTOM PROCEDURE TRAY) ×2 IMPLANT
PIN SAFETY STERILE (MISCELLANEOUS) IMPLANT
SIZER BREAST GEL REUSE 300CC (SIZER) ×1 IMPLANT
SIZER BRST GEL REUSE 300CC (SIZER) IMPLANT
SLEEVE SCD COMPRESS KNEE MED (STOCKING) ×2 IMPLANT
SPIKE FLUID TRANSFER (MISCELLANEOUS) IMPLANT
SPONGE T-LAP 18X18 ~~LOC~~+RFID (SPONGE) IMPLANT
STRIP CLOSURE SKIN 1/2X4 (GAUZE/BANDAGES/DRESSINGS) ×4 IMPLANT
SUT MNCRL AB 3-0 PS2 18 (SUTURE) IMPLANT
SUT MNCRL AB 4-0 PS2 18 (SUTURE) IMPLANT
SUT MON AB 5-0 PS2 18 (SUTURE) IMPLANT
SUT PDS 3-0 CT2 (SUTURE) ×5 IMPLANT
SUT PDS AB 2-0 CT2 27 (SUTURE) ×6 IMPLANT
SUT PDS II 3-0 CT2 27 ABS (SUTURE) IMPLANT
SUT VIC AB 3-0 SH 27X BRD (SUTURE) ×2 IMPLANT
SUT VIC AB 4-0 PS2 18 (SUTURE) IMPLANT
SYR CONTROL 10ML LL (SYRINGE) IMPLANT
TOWEL GREEN STERILE FF (TOWEL DISPOSABLE) ×4 IMPLANT
TRAY PREMIUM WET SKIN SCRUB (MISCELLANEOUS) ×2 IMPLANT
UNDERPAD 30X36 HEAVY ABSORB (UNDERPADS AND DIAPERS) ×4 IMPLANT

## 2023-06-17 NOTE — Interval H&P Note (Signed)
 History and Physical Interval Note:  06/17/2023 7:17 AM  Meghan Mcdonald  has presented today for surgery, with the diagnosis of history of breast cancer.  The various methods of treatment have been discussed with the patient and family. After consideration of risks, benefits and other options for treatment, the patient has consented to  Procedure(s): REMOVAL OF TISSUE EXPANDER AND PLACEMENT OF IMPLANT WITH MASTOPEXY (Right) as a surgical intervention.  The patient's history has been reviewed, patient examined, no change in status, stable for surgery.  I have reviewed the patient's chart and labs.  Questions were answered to the patient's satisfaction.     Meghan Mcdonald

## 2023-06-17 NOTE — Op Note (Signed)
 Op report Unilateral Breast Exchange   DATE OF OPERATION:  06/17/2023  LOCATION: [facility]  SURGICAL DIVISION: Plastic Surgery  PREOPERATIVE DIAGNOSES:  1. History of right breast cancer.  2. Acquired absence of right breast.  3. Left breast asymmetry.  POSTOPERATIVE DIAGNOSES:  Same as preoperative diagnosis.   PROCEDURE:  1. Exchange of right tissue expander for implant. 2. Capsulectomy for implant repositioning 3 x 10 cm. 3. Mastopexy of left breast for symmetry.  SURGEON: Foster Simpson, DO  ASSISTANT: Keenan Bachelor, PA  ANESTHESIA:  General.   COMPLICATIONS: None.   IMPLANTS: Mentor Smooth Round High Profile Gel 275 cc.  INDICATIONS FOR PROCEDURE:  The patient, Meghan Mcdonald, is a 68 y.o. female born on 28-Nov-1955, is here for further treatment after a mastectomy and placement of a tissue expander. She now presents for exchange of her expander for an implant.  She requires capsulotomies to better position the implant. She also needs a mastopexy of the left breast for symmetry. MRN: [id number]  CONSENT:  Informed consent was obtained directly from the patient. Risks, benefits and alternatives were fully discussed. Specific risks including but not limited to bleeding, infection, hematoma, seroma, scarring, pain, implant infection, implant extrusion, capsular contracture, asymmetry, wound healing problems, and need for further surgery were all discussed. The patient did have an ample opportunity to have her questions answered to her satisfaction.   DESCRIPTION OF PROCEDURE:  The patient was taken to the operating room. SCDs were placed and IV antibiotics were given. The patient's chest was prepped and draped in a sterile fashion. A time out was performed and the implants to be used were identified.  Local with epinephrine was used to infiltrate the area.   Right:  The inferior latissimus incision was utilized.The #10 blade was used to incise the skin.   The bovie was used to dissect to the chest wall and expose the implant.  The expander was drained and then removed.  The pocket was irrigate with Vashe. The lateral 3 x 10 cm of capsule was excised.  It was then repositioned medially to help decrease the amount of lateralization of the implant.  This was done with a running 3-0 PDS.  Measurements were made to confirm adequate pocket size for the implant dimensions.  Hemostasis was ensured with electrocautery.  The pocket was irrigated with vashe and a 300 cc sizer was tried.  It was too large.  The implant was then selected and inserted with the keller funnel after new gloves were placed.  The muscle was then attached to the inframammary fold with the 3-0 PDS.  The remaining skin was closed with 4-0 PDS deep dermal and 4-0 Monocryl subcuticular stitches.  Dermabond was applied.    Left side: Preoperative markings were confirmed.  Incision lines were injected with local containing epinephrine.  After waiting for vasoconstriction, the marked lines were incised with a #15 blade.  A lollipop type incision was made in order to obtain a lift of the areola. The periareola skin was de-epithelialized and the vertical limb. ,The bovie was used to create the excise breast fat at the vertical limb.  Hemostasis was achieved.  The nipple was gently lifted into position and the soft tissue was closed with 3-0 and then 4-0 Monocryl.  The patient was sat upright and size and shape symmetry was confirmed.  The deep tissues were approximated with 3-0 PDS sutures. Experel was placed in each breast. The skin was closed with deep dermal 3-0 Monocryl  and subcuticular 4-0 Monocryl sutures.  Dermabond was applied.  A breast binder and ABDs were placed.  The nipple and skin flaps had good capillary refill at the end of the procedure.  The patient tolerated the procedure well. The patient was allowed to wake from anesthesia and taken to the recovery room in satisfactory condition.  The  advanced practice practitioner (APP) assisted throughout the case.  The APP was essential in retraction and counter traction when needed to make the case progress smoothly.  This retraction and assistance made it possible to see the tissue plans for the procedure.  The assistance was needed for blood control, tissue re-approximation and assisted with closure of the incision site.

## 2023-06-17 NOTE — Anesthesia Postprocedure Evaluation (Signed)
 Anesthesia Post Note  Patient: Meghan Mcdonald  Procedure(s) Performed: REMOVAL OF RIGHT TISSUE EXPANDER AND PLACEMENT OF IMPLANT WITH LEFT MASTOPEXY (Right: Breast)     Patient location during evaluation: PACU Anesthesia Type: General Level of consciousness: sedated and patient cooperative Pain management: pain level controlled Vital Signs Assessment: post-procedure vital signs reviewed and stable Respiratory status: spontaneous breathing Cardiovascular status: stable Anesthetic complications: no   No notable events documented.  Last Vitals:  Vitals:   06/17/23 1015 06/17/23 1031  BP: (!) 141/64 (!) 157/78  Pulse: 65 (!) 59  Resp: 13   Temp:  (!) 36.4 C  SpO2: 98% 95%    Last Pain:  Vitals:   06/17/23 1031  TempSrc: Oral  PainSc: 0-No pain                 Lewie Loron

## 2023-06-17 NOTE — Anesthesia Procedure Notes (Signed)
 Procedure Name: Intubation Date/Time: 06/17/2023 7:38 AM  Performed by: Micki Riley, CRNAPre-anesthesia Checklist: Patient identified, Emergency Drugs available, Suction available, Patient being monitored and Timeout performed Patient Re-evaluated:Patient Re-evaluated prior to induction Oxygen Delivery Method: Circle system utilized Preoxygenation: Pre-oxygenation with 100% oxygen Induction Type: IV induction Ventilation: Mask ventilation without difficulty Laryngoscope Size: Miller and 2 Grade View: Grade I Tube type: Oral Tube size: 7.0 mm Number of attempts: 1 Airway Equipment and Method: Patient positioned with wedge pillow and Stylet Placement Confirmation: ETT inserted through vocal cords under direct vision Secured at: 21 cm Tube secured with: Tape Dental Injury: Teeth and Oropharynx as per pre-operative assessment

## 2023-06-17 NOTE — Transfer of Care (Signed)
 Immediate Anesthesia Transfer of Care Note  Patient: Meghan Mcdonald  Procedure(s) Performed: REMOVAL OF RIGHT TISSUE EXPANDER AND PLACEMENT OF IMPLANT WITH LEFT MASTOPEXY (Right: Breast)  Patient Location: PACU  Anesthesia Type:General  Level of Consciousness: awake, alert , and oriented  Airway & Oxygen Therapy: Patient Spontanous Breathing and Patient connected to face mask oxygen  Post-op Assessment: Report given to RN  Post vital signs: stable  Last Vitals:  Vitals Value Taken Time  BP 149/70 06/17/23 0906  Temp    Pulse 71 06/17/23 0915  Resp 12 06/17/23 0915  SpO2 99 % 06/17/23 0915  Vitals shown include unfiled device data.  Last Pain:  Vitals:   06/17/23 0545  TempSrc:   PainSc: 0-No pain         Complications: No notable events documented.

## 2023-06-17 NOTE — Discharge Instructions (Signed)

## 2023-06-18 ENCOUNTER — Encounter (HOSPITAL_COMMUNITY): Payer: Self-pay | Admitting: Plastic Surgery

## 2023-06-19 ENCOUNTER — Ambulatory Visit (INDEPENDENT_AMBULATORY_CARE_PROVIDER_SITE_OTHER): Payer: Self-pay | Admitting: Physician Assistant

## 2023-06-19 DIAGNOSIS — Z9889 Other specified postprocedural states: Secondary | ICD-10-CM

## 2023-06-19 NOTE — Progress Notes (Signed)
 Patient is a pleasant 68 year old female with a history of right breast latissimus myocutaneous flap reconstruction with tissue expander placement s/p removal of right breast tissue expander and placement of implant with left breast mastopexy for symmetry performed 06/17/2023 Dr. Ulice Bold who joins via telephone for postoperative day 1 check-in.  Reviewed operative report and capsulectomy was performed on the right side for implant repositioning.  275 cc Mentor smooth round high-profile gel implant was placed.  Left mastopexy for symmetry.  Today, patient is doing well.  She states that she is a bit sore bilaterally, R > L.  She denies any obvious objective swelling.  She is tolerating p.o. intake without difficulty.  Voiding, only mildly constipated but passing stool.  Ambulatory.  She denies any chest pain, difficulty breathing, leg swelling, or fevers.  Pain has been controlled with Tylenol and Aleve alone.  Discussed showering as well as ongoing activity restrictions/compressive garments.  Follow-up next week, as scheduled.  She understands to call the office should she develop any new or worsening symptoms in the interim.

## 2023-06-26 ENCOUNTER — Encounter: Payer: Self-pay | Admitting: Plastic Surgery

## 2023-06-26 ENCOUNTER — Ambulatory Visit: Payer: Medicare HMO | Admitting: Plastic Surgery

## 2023-06-26 DIAGNOSIS — Z9011 Acquired absence of right breast and nipple: Secondary | ICD-10-CM

## 2023-06-26 DIAGNOSIS — Z9889 Other specified postprocedural states: Secondary | ICD-10-CM

## 2023-06-26 NOTE — Progress Notes (Signed)
 The patient is a 68 year old female here for follow-up after her surgery on February 26.  She had a left breast mastopexy and removal of right breast expander with placement of implant.  She has a Dance movement psychotherapist smooth round high-profile gel 275 cc implant in place.  She has a fair bit of bruising of the left breast.  This is to be expected.  No sign of a hematoma or seroma on either side.  I removed the Mepilex border dressing and replace the Steri-Strips on the left breast.  She should continue with the breast binder.  Will plan to see her back in 10 days.  Pictures were obtained of the patient and placed in the chart with the patient's or guardian's permission.

## 2023-07-06 ENCOUNTER — Ambulatory Visit: Payer: Medicare HMO | Admitting: Physician Assistant

## 2023-07-06 VITALS — BP 134/74 | HR 61

## 2023-07-06 DIAGNOSIS — Z9889 Other specified postprocedural states: Secondary | ICD-10-CM

## 2023-07-06 NOTE — Progress Notes (Signed)
 Patient is a pleasant 68 year old female with a history of right breast latissimus myocutaneous flap reconstruction with tissue expander placement s/p removal of right breast tissue expander and placement of implant with left breast mastopexy for symmetry performed 06/17/2023 Dr. Ulice Bold who presents to clinic for postoperative follow-up.  She was last seen here in clinic on 06/26/2023.  At that time, she was [place] of bruising to the left breast, but no evidence of hematoma or seroma on either side.  Steri-Strips were removed and replaced.  Return in 10 days.  Today, she is doing well.  She states that the bruising has improved considerably and her breast has softened.  Denies any significant persistent ecchymoses.  No specific questions or concerns.  Denies any chest pain, difficulty breathing, leg swelling, or fevers.  She will ultimately be interested in NAC tattooing on the right.  On exam, breasts have excellent shape and symmetry.  Soft throughout.  Steri-Strips removed on each breast.  No visible wounds or defects noted.  A few scattered sutures are removed without complication or difficulty.  Left lateral breast is soft, minimal residual ecchymoses.  Right reconstructed breast is also soft, no palpable areas of firmness.  No underlying fluid collections bilaterally.  She is healing quite well.  Recommend continued compressive garments and activity modifications x 2 weeks.  Applied Vaseline to her incisions throughout to help with residual Dermabond.  Will likely transition to silicone scar gels at next encounter as well as remove any residual suture knots.  Picture(s) obtained of the patient and placed in the chart were with the patient's or guardian's permission.

## 2023-07-07 ENCOUNTER — Encounter: Payer: Medicare HMO | Admitting: Surgical

## 2023-07-22 ENCOUNTER — Ambulatory Visit (INDEPENDENT_AMBULATORY_CARE_PROVIDER_SITE_OTHER): Payer: Medicare HMO | Admitting: Surgical

## 2023-07-22 VITALS — BP 127/78 | HR 67

## 2023-07-22 DIAGNOSIS — N6489 Other specified disorders of breast: Secondary | ICD-10-CM

## 2023-07-22 DIAGNOSIS — Z9889 Other specified postprocedural states: Secondary | ICD-10-CM

## 2023-07-22 DIAGNOSIS — Z9011 Acquired absence of right breast and nipple: Secondary | ICD-10-CM

## 2023-07-22 DIAGNOSIS — Z170 Estrogen receptor positive status [ER+]: Secondary | ICD-10-CM

## 2023-07-22 DIAGNOSIS — D0511 Intraductal carcinoma in situ of right breast: Secondary | ICD-10-CM

## 2023-07-22 NOTE — Progress Notes (Signed)
 Patient is a very pleasant 68 year old female here for follow-up on her  breast reconstruction.  She has a history of right latissimus myocutaneous muscle flap on the right side.  She most recently underwent removal of tissue expander and placement of right breast implant and left breast mastopexy for symmetry.  Surgery was on 06/17/2023.  She is 5 weeks postop.  She reports she is overall doing really well.  She does report that she fell yesterday at home while working in her garden and bumped her head on the edge of her garden bed.  She reports that she is feeling fine today, reports that she had a headache yesterday but this has resolved.  She does has a bump on her posterior right scalp.  She reports in regards to surgery she is healing well, she is very pleased with her results at this time.  She has previously discussed nipple areola tattooing and is going to follow-up in a few months to discuss this further.  Chaperone present on exam On exam bilateral breast incisions are intact and healing well.  Left NAC is viable, left breast incisions are intact and well-healed.  Right latissimus flap with good color and capillary refill.  Bilateral breasts are soft, no subcutaneous fluid collections noted.  No erythema or cellulitic changes.  A/P:  Recommend following up in 4 months to discuss nipple areola tattoo with Evelena Leyden, PA-C.  We discussed that if she has any changes in her symptoms or concerns prior to then, please notify our office and we can certainly see her back at any point.  Continue with compressive garments, avoid strenuous activities or heavy lifting for 1 more week.  She can transition to wearing compression only during the day in 1 week.  She is aware that she will need initial ultrasound or MRI 5 years after placement of right breast implant.

## 2023-07-24 ENCOUNTER — Encounter: Payer: Medicare HMO | Admitting: Surgical

## 2023-08-11 DIAGNOSIS — R748 Abnormal levels of other serum enzymes: Secondary | ICD-10-CM | POA: Diagnosis not present

## 2023-08-11 DIAGNOSIS — C50411 Malignant neoplasm of upper-outer quadrant of right female breast: Secondary | ICD-10-CM | POA: Diagnosis not present

## 2023-08-11 DIAGNOSIS — I10 Essential (primary) hypertension: Secondary | ICD-10-CM | POA: Diagnosis not present

## 2023-08-11 DIAGNOSIS — E782 Mixed hyperlipidemia: Secondary | ICD-10-CM | POA: Diagnosis not present

## 2023-08-11 DIAGNOSIS — D126 Benign neoplasm of colon, unspecified: Secondary | ICD-10-CM | POA: Diagnosis not present

## 2023-08-11 DIAGNOSIS — Z7185 Encounter for immunization safety counseling: Secondary | ICD-10-CM | POA: Diagnosis not present

## 2023-08-11 DIAGNOSIS — E1169 Type 2 diabetes mellitus with other specified complication: Secondary | ICD-10-CM | POA: Diagnosis not present

## 2023-08-17 ENCOUNTER — Ambulatory Visit: Payer: Medicare HMO | Attending: Hematology

## 2023-08-17 VITALS — Wt 143.5 lb

## 2023-08-17 DIAGNOSIS — Z483 Aftercare following surgery for neoplasm: Secondary | ICD-10-CM | POA: Insufficient documentation

## 2023-08-17 NOTE — Therapy (Signed)
 OUTPATIENT PHYSICAL THERAPY SOZO SCREENING NOTE   Patient Name: Meghan Mcdonald MRN: [id number] DOB:1955-07-15, 68 y.o., female Today's Date: 08/17/2023  PCP: Jinger Mount, DO (Inactive) REFERRING PROVIDER: Sonja Milford, MD   PT End of Session - 08/17/23 1511     Visit Number 8   # unchanged due to screen only   PT Start Time 1510    PT Stop Time 1514    PT Time Calculation (min) 4 min    Activity Tolerance Patient tolerated treatment well    Behavior During Therapy WFL for tasks assessed/performed             Past Medical History:  Diagnosis Date   Anemia    years ago   Anxiety    Arthritis    Breast cancer (HCC)    Chronic shoulder pain    COVID    x 2 both mild cases   Depression    Diabetes mellitus without complication (HCC)    GERD (gastroesophageal reflux disease)    History of kidney stones    Hyperlipemia    Hypertension    Insomnia    Lichen sclerosus of vulva    Migraine    Overweight    PONV (postoperative nausea and vomiting)    Rapid palpitations    Ventricular tachycardia (paroxysmal) (HCC)    Past Surgical History:  Procedure Laterality Date   BLADDER SURGERY     BREAST IMPLANT REMOVAL Right 09/19/2022   Procedure: REMOVAL RIGHT TISSUE EXPANDER;  Surgeon: Thornell Flirt, DO;  Location: [facility];  Service: Plastics;  Laterality: Right;   BREAST RECONSTRUCTION WITH PLACEMENT OF TISSUE EXPANDER AND FLEX HD (ACELLULAR HYDRATED DERMIS) Right 07/31/2022   Procedure: IMMEDIATE RIGHT BREAST RECONSTRUCTION WITH PLACEMENT OF TISSUE EXPANDER AND FLEX HD (ACELLULAR HYDRATED DERMIS);  Surgeon: Thornell Flirt, DO;  Location: [facility];  Service: Plastics;  Laterality: Right;   CARPAL TUNNEL RELEASE     CATARACT EXTRACTION Bilateral    CHOLECYSTECTOMY     LATISSIMUS FLAP TO BREAST Right 03/26/2023   Procedure: right latissimus muscle flap with expander placement;  Surgeon: Thornell Flirt, DO;  Location: [facility];  Service:  Plastics;  Laterality: Right;   MASTECTOMY W/ SENTINEL NODE BIOPSY Right 07/31/2022   Procedure: RIGHT MASTECTOMY WITH SENTINEL LYMPH NODE BIOPSY;  Surgeon: Caralyn Chandler, MD;  Location: [facility];  Service: General;  Laterality: Right;   ORIF ANKLE FRACTURE Right 07/22/2018   Procedure: OPEN REDUCTION INTERNAL FIXATION (ORIF) RIGHT BIMALL ANKLE FRACTURE;  Surgeon: Donnamarie Gables, MD;  Location: [facility];  Service: Orthopedics;  Laterality: Right;   ORIF TIBIA & FIBULA FRACTURES     REMOVAL OF TISSUE EXPANDER AND PLACEMENT OF IMPLANT Right 06/17/2023   Procedure: REMOVAL OF RIGHT TISSUE EXPANDER AND PLACEMENT OF IMPLANT WITH LEFT MASTOPEXY;  Surgeon: Thornell Flirt, DO;  Location: [facility];  Service: Plastics;  Laterality: Right;   TONSILLECTOMY     Patient Active Problem List   Diagnosis Date Noted   Hx of breast cancer 03/26/2023   Acquired absence of right breast 03/26/2023   Postoperative breast asymmetry 12/19/2022   S/P breast reconstruction 07/31/2022   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, estrogen receptor positive (HCC) 05/26/2022   Acute CVA (cerebrovascular accident) (HCC) 12/23/2017   Severe vertigo 12/22/2017   Vertigo 12/22/2017   Dyslipidemia 12/22/2017   Essential hypertension 12/22/2017   Nausea & vomiting 12/22/2017    REFERRING  DIAG: right breast cancer at risk for lymphedema  THERAPY DIAG: Aftercare following surgery for neoplasm  PERTINENT HISTORY: Anemia, anxiety, breast CA s/p R mastectomy 07/31/22 and implant removal 09/09/22, depression, DM, GERD, HLD, HTN, migraine, palpitations, R ankle ORIF   PRECAUTIONS: right UE Lymphedema risk, None  SUBJECTIVE: Pt returns for her 3 month L-Dex screen.   PAIN:  Are you having pain? No  SOZO SCREENING: Patient was assessed today using the SOZO machine to determine the lymphedema index score. This was compared to her baseline score. It was  determined that she is within the recommended range when compared to her baseline and no further action is needed at this time. She will continue SOZO screenings. These are done every 3 months for 2 years post operatively followed by every 6 months for 2 years, and then annually.  Spoke with pt about physical therapy options within scope of practice for her current postural weakness and tightness she reports. She sees the surgeon Friday and will speak to them about starting PT.   L-DEX FLOWSHEETS - 08/17/23 1500       L-DEX LYMPHEDEMA SCREENING   Measurement Type Unilateral    L-DEX MEASUREMENT EXTREMITY Upper Extremity    POSITION  Standing    DOMINANT SIDE Right    At Risk Side Right    BASELINE SCORE (UNILATERAL) -4    L-DEX SCORE (UNILATERAL) -0.9    VALUE CHANGE (UNILAT) 3.1               Denyce Flank, PTA 08/17/2023, 3:14 PM

## 2023-09-15 DIAGNOSIS — C50411 Malignant neoplasm of upper-outer quadrant of right female breast: Secondary | ICD-10-CM | POA: Diagnosis not present

## 2023-09-16 DIAGNOSIS — Z86018 Personal history of other benign neoplasm: Secondary | ICD-10-CM | POA: Diagnosis not present

## 2023-09-24 DIAGNOSIS — L821 Other seborrheic keratosis: Secondary | ICD-10-CM | POA: Diagnosis not present

## 2023-10-28 DIAGNOSIS — M25552 Pain in left hip: Secondary | ICD-10-CM | POA: Diagnosis not present

## 2023-11-03 DIAGNOSIS — K649 Unspecified hemorrhoids: Secondary | ICD-10-CM | POA: Diagnosis not present

## 2023-11-03 DIAGNOSIS — Z09 Encounter for follow-up examination after completed treatment for conditions other than malignant neoplasm: Secondary | ICD-10-CM | POA: Diagnosis not present

## 2023-11-03 DIAGNOSIS — Z860101 Personal history of adenomatous and serrated colon polyps: Secondary | ICD-10-CM | POA: Diagnosis not present

## 2023-11-03 DIAGNOSIS — D120 Benign neoplasm of cecum: Secondary | ICD-10-CM | POA: Diagnosis not present

## 2023-11-05 DIAGNOSIS — D120 Benign neoplasm of cecum: Secondary | ICD-10-CM | POA: Diagnosis not present

## 2023-11-16 ENCOUNTER — Ambulatory Visit: Attending: Hematology

## 2023-11-16 VITALS — Wt 144.2 lb

## 2023-11-16 DIAGNOSIS — Z483 Aftercare following surgery for neoplasm: Secondary | ICD-10-CM | POA: Insufficient documentation

## 2023-11-16 NOTE — Therapy (Signed)
 OUTPATIENT PHYSICAL THERAPY [facility] SCREENING NOTE   Patient Name: Meghan Mcdonald MRN: [id number] DOB:March 31, 1956, 68 y.o., female Today's Date: 11/16/2023  PCP: Cindy Clotilda HERO, DO (Inactive) REFERRING PROVIDER: Lanny Callander, MD   PT End of Session - 11/16/23 1654     Visit Number 8   # unchanged due to screen only   PT Start Time 1651    PT Stop Time 1656    PT Time Calculation (min) 5 min    Activity Tolerance Patient tolerated treatment well    Behavior During Therapy WFL for tasks assessed/performed          Past Medical History:  Diagnosis Date   Anemia    years ago   Anxiety    Arthritis    Breast cancer (HCC)    Chronic shoulder pain    COVID    x 2 both mild cases   Depression    Diabetes mellitus without complication (HCC)    GERD (gastroesophageal reflux disease)    History of kidney stones    Hyperlipemia    Hypertension    Insomnia    Lichen sclerosus of vulva    Migraine    Overweight    PONV (postoperative nausea and vomiting)    Rapid palpitations    Ventricular tachycardia (paroxysmal) (HCC)    Past Surgical History:  Procedure Laterality Date   BLADDER SURGERY     BREAST IMPLANT REMOVAL Right 09/19/2022   Procedure: REMOVAL RIGHT TISSUE EXPANDER;  Surgeon: Lowery Estefana RAMAN, DO;  Location: [facility];  Service: Plastics;  Laterality: Right;   BREAST RECONSTRUCTION WITH PLACEMENT OF TISSUE EXPANDER AND FLEX HD (ACELLULAR HYDRATED DERMIS) Right 07/31/2022   Procedure: IMMEDIATE RIGHT BREAST RECONSTRUCTION WITH PLACEMENT OF TISSUE EXPANDER AND FLEX HD (ACELLULAR HYDRATED DERMIS);  Surgeon: Lowery Estefana RAMAN, DO;  Location: [facility];  Service: Plastics;  Laterality: Right;   CARPAL TUNNEL RELEASE     CATARACT EXTRACTION Bilateral    CHOLECYSTECTOMY     LATISSIMUS FLAP TO BREAST Right 03/26/2023   Procedure: right latissimus muscle flap with expander placement;  Surgeon: Lowery Estefana RAMAN, DO;  Location: [facility];  Service: Plastics;   Laterality: Right;   MASTECTOMY W/ SENTINEL NODE BIOPSY Right 07/31/2022   Procedure: RIGHT MASTECTOMY WITH SENTINEL LYMPH NODE BIOPSY;  Surgeon: Curvin Deward MOULD, MD;  Location: [facility];  Service: General;  Laterality: Right;   ORIF ANKLE FRACTURE Right 07/22/2018   Procedure: OPEN REDUCTION INTERNAL FIXATION (ORIF) RIGHT BIMALL ANKLE FRACTURE;  Surgeon: Elsa Lonni SAUNDERS, MD;  Location: [facility];  Service: Orthopedics;  Laterality: Right;   ORIF TIBIA & FIBULA FRACTURES     REMOVAL OF TISSUE EXPANDER AND PLACEMENT OF IMPLANT Right 06/17/2023   Procedure: REMOVAL OF RIGHT TISSUE EXPANDER AND PLACEMENT OF IMPLANT WITH LEFT MASTOPEXY;  Surgeon: Lowery Estefana RAMAN, DO;  Location: [facility];  Service: Plastics;  Laterality: Right;   TONSILLECTOMY     Patient Active Problem List   Diagnosis Date Noted   Hx of breast cancer 03/26/2023   Acquired absence of right breast 03/26/2023   Postoperative breast asymmetry 12/19/2022   S/P breast reconstruction 07/31/2022   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, estrogen receptor positive (HCC) 05/26/2022   Acute CVA (cerebrovascular accident) (HCC) 12/23/2017   Severe vertigo 12/22/2017   Vertigo 12/22/2017   Dyslipidemia 12/22/2017   Essential hypertension 12/22/2017   Nausea & vomiting 12/22/2017    REFERRING DIAG: right breast  cancer at risk for lymphedema  THERAPY DIAG: Aftercare following surgery for neoplasm  PERTINENT HISTORY: Anemia, anxiety, breast CA s/p R mastectomy with 2 LN's removed 07/31/22 and implant removal 09/09/22, depression, DM, GERD, HLD, HTN, migraine, palpitations, R ankle ORIF   PRECAUTIONS: right UE Lymphedema risk, None  SUBJECTIVE: Pt returns for her 3 month L-Dex screen.   PAIN:  Are you having pain? No  SOZO SCREENING: Patient was assessed today using the SOZO machine to determine the lymphedema index score. This was compared to her baseline score. It was  determined that she is within the recommended range when compared to her baseline and no further action is needed at this time. She will continue SOZO screenings. These are done every 3 months for 2 years post operatively followed by every 6 months for 2 years, and then annually.    L-DEX FLOWSHEETS - 11/16/23 1600       L-DEX LYMPHEDEMA SCREENING   Measurement Type Unilateral    L-DEX MEASUREMENT EXTREMITY Upper Extremity    POSITION  Standing    DOMINANT SIDE Right    At Risk Side Right    BASELINE SCORE (UNILATERAL) -4    L-DEX SCORE (UNILATERAL) -2.5    VALUE CHANGE (UNILAT) 1.5            Meghan Mcdonald, PTA 11/16/2023, 4:55 PM

## 2023-11-19 ENCOUNTER — Ambulatory Visit: Admitting: Physician Assistant

## 2023-11-19 DIAGNOSIS — Z9011 Acquired absence of right breast and nipple: Secondary | ICD-10-CM | POA: Diagnosis not present

## 2023-11-19 DIAGNOSIS — Z170 Estrogen receptor positive status [ER+]: Secondary | ICD-10-CM | POA: Diagnosis not present

## 2023-11-19 DIAGNOSIS — Z9889 Other specified postprocedural states: Secondary | ICD-10-CM

## 2023-11-19 DIAGNOSIS — C50411 Malignant neoplasm of upper-outer quadrant of right female breast: Secondary | ICD-10-CM

## 2023-11-19 NOTE — Progress Notes (Signed)
 Referring Provider Masneri, Clotilda HERO, DO [address] [facility] [address] [place],  [place] [address]   CC:  Chief Complaint  Patient presents with   Advice Only      Meghan Mcdonald is an 68 y.o. female.  HPI:  with a history of right breast latissimus myocutaneous flap reconstruction with tissue expander placement s/p removal of right breast tissue expander and placement of implant with left breast mastopexy for symmetry performed 06/17/2023 Dr. Lowery who presents to clinic for nipple tattoo consult.  She was last seen here in clinic on 07/22/2023.  At that time, she was pleased with the outcome of her implant placement and contralateral mastopexy for symmetry.  She was interested in moving forward with nipple areolar tattoo restoration.  Today, patient is doing well.  She denies any changes in her interim health.  She is however watching her liver enzymes closely with her PCP.  Hemoglobin A1c is well-controlled below 7%.  No former history of radiation.  She is overall pleased with the reconstruction she is not on any anticoagulation or immunosuppressive medication.  Discussed risks and benefits of the nipple areolar tattoo restoration and she is agreeable to proceed.   Allergies  Allergen Reactions   Hibiclens  [Chlorhexidine  Gluconate] Rash    Skin blisters   Prednisone Rash    Patient states turns red all over    Aspirin  Other (See Comments)    Severe Blood Thinning   Micardis [Telmisartan] Other (See Comments)    unknown   Amitriptyline Other (See Comments)    oversedation   Loestrin [Norethindrone Acet-Ethinyl Est] Rash    Outpatient Encounter Medications as of 11/19/2023  Medication Sig Note   acetaminophen  (TYLENOL ) 500 MG tablet Take 1,000 mg by mouth every 6 (six) hours as needed for moderate pain (pain score 4-6).    atorvastatin  (LIPITOR ) 80 MG tablet Take 1 tablet (80 mg total) by mouth daily at 6 PM.    Azelastine HCl 137 MCG/SPRAY SOLN Place 1 spray into both nostrils 2  (two) times daily as needed (allergies).    Calcium  Carb-Cholecalciferol (CALCIUM  600 + D PO) Take 1 tablet by mouth daily.    calcium  elemental as carbonate (TUMS ULTRA 1000) 400 MG chewable tablet Chew 1,000 mg by mouth daily as needed for heartburn.    clobetasol ointment (TEMOVATE) 0.05 % Apply 1 Application topically 2 (two) times a week. 03/18/2023: Sundays and Wednesdays   diphenhydrAMINE  (BENADRYL ) 25 MG tablet Take 25 mg by mouth at bedtime.    hydrochlorothiazide (HYDRODIURIL) 25 MG tablet Take 12.5 mg by mouth daily.    meloxicam (MOBIC) 15 MG tablet Take 15 mg by mouth daily as needed for pain (inflammation).    metFORMIN  (GLUCOPHAGE -XR) 500 MG 24 hr tablet Take 500-1,000 mg by mouth daily with supper.    metoprolol  tartrate (LOPRESSOR ) 50 MG tablet Take 50 mg by mouth 2 (two) times daily.    Misc Natural Products ([employer] SLEEP SUPPORT PO) Take 1 tablet by mouth at bedtime.    Multiple Vitamins-Minerals (PRESERVISION AREDS 2) CAPS Take 1 capsule by mouth daily.    naproxen sodium (ALEVE) 220 MG tablet Take 220 mg by mouth 2 (two) times daily as needed (pain).    Omega-3 Fatty Acids (FISH OIL) 1000 MG CAPS Take 1,000 mg by mouth every evening.    ondansetron  (ZOFRAN ) 4 MG tablet Take 1 tablet (4 mg total) by mouth every 8 (eight) hours as needed for nausea or vomiting.    ondansetron  (ZOFRAN -ODT)  4 MG disintegrating tablet Take 1 tablet (4 mg total) by mouth every 8 (eight) hours as needed for nausea or vomiting.    potassium chloride  (KLOR-CON  M) 10 MEQ tablet Take 1 tablet (10 mEq total) by mouth daily.    ramipril (ALTACE) 10 MG capsule Take 10 mg by mouth 2 (two) times daily.    Iron , Ferrous Sulfate , 325 (65 Fe) MG TABS Take 325 mg by mouth daily for 7 days. (Patient not taking: Reported on 11/19/2023)    No facility-administered encounter medications on file as of 11/19/2023.     Past Medical History:  Diagnosis Date   Anemia    years ago   Anxiety    Arthritis    Breast  cancer (HCC)    Chronic shoulder pain    COVID    x 2 both mild cases   Depression    Diabetes mellitus without complication (HCC)    GERD (gastroesophageal reflux disease)    History of kidney stones    Hyperlipemia    Hypertension    Insomnia    Lichen sclerosus of vulva    Migraine    Overweight    PONV (postoperative nausea and vomiting)    Rapid palpitations    Ventricular tachycardia (paroxysmal) (HCC)     Past Surgical History:  Procedure Laterality Date   BLADDER SURGERY     BREAST IMPLANT REMOVAL Right 09/19/2022   Procedure: REMOVAL RIGHT TISSUE EXPANDER;  Surgeon: Lowery Estefana RAMAN, DO;  Location: [facility];  Service: Plastics;  Laterality: Right;   BREAST RECONSTRUCTION WITH PLACEMENT OF TISSUE EXPANDER AND FLEX HD (ACELLULAR HYDRATED DERMIS) Right 07/31/2022   Procedure: IMMEDIATE RIGHT BREAST RECONSTRUCTION WITH PLACEMENT OF TISSUE EXPANDER AND FLEX HD (ACELLULAR HYDRATED DERMIS);  Surgeon: Lowery Estefana RAMAN, DO;  Location: [facility];  Service: Plastics;  Laterality: Right;   CARPAL TUNNEL RELEASE     CATARACT EXTRACTION Bilateral    CHOLECYSTECTOMY     LATISSIMUS FLAP TO BREAST Right 03/26/2023   Procedure: right latissimus muscle flap with expander placement;  Surgeon: Lowery Estefana RAMAN, DO;  Location: [facility];  Service: Plastics;  Laterality: Right;   MASTECTOMY W/ SENTINEL NODE BIOPSY Right 07/31/2022   Procedure: RIGHT MASTECTOMY WITH SENTINEL LYMPH NODE BIOPSY;  Surgeon: Curvin Deward MOULD, MD;  Location: [facility];  Service: General;  Laterality: Right;   ORIF ANKLE FRACTURE Right 07/22/2018   Procedure: OPEN REDUCTION INTERNAL FIXATION (ORIF) RIGHT BIMALL ANKLE FRACTURE;  Surgeon: Elsa Lonni SAUNDERS, MD;  Location: [facility];  Service: Orthopedics;  Laterality: Right;   ORIF TIBIA & FIBULA FRACTURES     REMOVAL OF TISSUE EXPANDER AND PLACEMENT OF IMPLANT Right 06/17/2023   Procedure: REMOVAL OF RIGHT TISSUE  EXPANDER AND PLACEMENT OF IMPLANT WITH LEFT MASTOPEXY;  Surgeon: Lowery Estefana RAMAN, DO;  Location: [facility];  Service: Plastics;  Laterality: Right;   TONSILLECTOMY      Family History  Problem Relation Age of Onset   Hypertension Mother    Hypertension Father     Social History   Social History Narrative   ** Merged History Encounter **        Lives with husband      Review of Systems General: Denies fevers or chills Cardio: Denies chest pain Pulmonary: Denies difficulty breathing  Physical Exam    11/16/2023    4:52 PM 08/17/2023    3:10 PM 07/22/2023    1:31 PM  Vitals with BMI  Weight 144 lbs 4 oz 143 lbs 8 oz   Systolic   127  Diastolic   78  Pulse   67    General:  No acute distress, nontoxic appearing  Respiratory: No increased work of breathing Neuro: Alert and oriented Psychiatric: Normal mood and affect   Assessment/Plan  Right breast cancer with unilateral mastectomy and implant-based reconstruction with latissimus dorsi flap:  Discussed risks of nipple areolar tattoo restoration including but not limited to pigment reaction, inadequate pigment saturation and fading, infection, scarring, and/or injury to the latissimus dorsi flap or underlying implant.  Plan for nipple areolar restoration via tattoo.  She understands that this will likely involve 3-5 sessions, the first of which will be determining NAC location, size, and color.  Discussed the contraindications as well as risks associated with the procedure and she would like to proceed, written consent obtained.     Honora Seip PA-C 11/19/2023, 4:06 PM

## 2023-11-29 ENCOUNTER — Other Ambulatory Visit: Payer: Self-pay | Admitting: Nurse Practitioner

## 2023-11-29 DIAGNOSIS — C50411 Malignant neoplasm of upper-outer quadrant of right female breast: Secondary | ICD-10-CM

## 2023-11-29 NOTE — Progress Notes (Signed)
 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 -She underwent a right mastectomy on July 31, 2022.  I reviewed her surgical pathology findings with her in detail, it showed 6 mm invasive ductal carcinoma, grade 2, and DCIS.  Surgical margins were negative, lymph nodes were negative. -We discussed her Oncotype results, which showed recurrence score of 21.  This is considered low risk disease, adjuvant chemotherapy is not recommended. -She does not need postmastectomy radiation  -She started adjuvant anastrozole  in July 2024, she stopped in August 2024 due to mood swings and depression. She started Tamoxifen  10mg  dialy in Sep 2024, held in 02/2023 due to transaminitis, and to restart in December after liver function improved.  -- 11/30/2023 -tamoxifen  still being held due to elevated liver functions.  Additional testing scheduled for later in August 2025. - Continue breast cancer surveillance with labs and follow-up in 6 months, sooner if needed.  Unilateral left mammogram to be scheduled in January 2026.    Transaminitis Tamoxifen  held from December 2024 due to elevated liver functions.  Now improved, LFTs continue to be high with AST 41, ALT 69, and ALKP 77.  She does have further testing scheduled later this month with her primary care provider.     Plan Labs reviewed. - CBC  unremarkable. - CMP showing AST of 41, ALT of 69, and ALKP 77.  Remainder of CMP is unremarkable. Reviewed unilateral mammogram from January 2025 which was benign.  Recall expected in January 2026. Continue to hold tamoxifen  due to elevated liver functions. Labs and follow-up in 6 months, sooner if needed.  The patient understands the plans discussed today and is in agreement with them.  She knows to contact our office if she develops concerns prior to her next appointment.  I provided 25 minutes of face-to-face time during this encounter and > 50% was spent counseling as documented under my assessment and plan.    Powell BRAVO [place], NP  [facility] - A DEPT OF [facility] [address] [place] [address] Dept: [phone] Dept Fax: [phone]   No orders of the defined types were placed in this encounter.     CHIEF COMPLAINT:  CC: right breast cancer, estrogen receptor positive   Current Treatment:  surveillance   INTERVAL HISTORY:  Meghan Mcdonald is here today for repeat clinical assessment. She last saw Dr. Lanny  on 06/02/2023.  Tamoxifen  has been held since December 2024 due to elevated liver functions.  She is scheduled to have further testing via her primary care provider.  She denies changes or concerns with the left breast.  Has noticed no swelling or changes of the right chest wall.  She had denies chest pain, chest pressure, or shortness of breath. She denies headaches or visual disturbances. She denies abdominal pain, nausea, vomiting, or changes in bowel or bladder habits.  She denies fevers or chills. She denies pain. Her  appetite is good. Her weight has been stable.  I have reviewed the past medical history, past surgical history, social history and family history with the patient and they are unchanged from previous note.  ALLERGIES:  is allergic to hibiclens  [chlorhexidine  gluconate], prednisone, aspirin ,  micardis [telmisartan], amitriptyline, and loestrin [norethindrone acet-ethinyl est].  MEDICATIONS:  Current Outpatient Medications  Medication Sig Dispense Refill   acetaminophen  (TYLENOL ) 500 MG tablet Take 1,000 mg by mouth every 6 (six) hours as needed for moderate pain (pain score 4-6).     atorvastatin  (LIPITOR ) 80 MG tablet Take 1 tablet (80 mg total) by mouth daily at 6 PM. 30 tablet 0   Azelastine HCl 137 MCG/SPRAY SOLN Place 1 spray into both nostrils 2 (two) times daily as needed (allergies).     Calcium  Carb-Cholecalciferol (CALCIUM  600 + D PO) Take 1 tablet by mouth daily.     calcium  elemental as carbonate (TUMS ULTRA 1000) 400 MG chewable tablet Chew 1,000 mg by mouth daily as needed for heartburn.     clobetasol ointment (TEMOVATE) 0.05 % Apply 1 Application topically 2 (two) times a week.     diphenhydrAMINE  (BENADRYL ) 25 MG tablet Take 25 mg by mouth at bedtime.     hydrochlorothiazide (HYDRODIURIL) 25 MG tablet Take 12.5 mg by mouth daily.     Iron , Ferrous Sulfate , 325 (65 Fe) MG TABS Take 325 mg by mouth daily for 7 days. (Patient not taking: Reported on 11/19/2023) 7 tablet 0   meloxicam (MOBIC) 15 MG tablet Take 15 mg by mouth daily as needed for pain (inflammation).     metFORMIN  (GLUCOPHAGE -XR) 500 MG 24 hr tablet Take 500-1,000 mg by mouth daily with supper.     metoprolol  tartrate (LOPRESSOR ) 50 MG tablet Take 50 mg by mouth 2 (two) times daily.     Misc Natural Products ([employer] SLEEP SUPPORT PO) Take 1 tablet by mouth at bedtime.     Multiple Vitamins-Minerals (PRESERVISION AREDS 2) CAPS Take 1 capsule by mouth daily.     naproxen sodium (ALEVE) 220 MG tablet Take 220 mg by mouth 2 (two) times daily as needed (pain).     Omega-3 Fatty Acids (FISH OIL) 1000 MG CAPS Take 1,000 mg by mouth every evening.     ondansetron  (ZOFRAN ) 4 MG tablet Take 1 tablet (4 mg total) by mouth every 8 (eight) hours as needed for nausea or vomiting. 20 tablet 0   ondansetron  (ZOFRAN -ODT) 4 MG  disintegrating tablet Take 1 tablet (4 mg total) by mouth every 8 (eight) hours as needed for nausea or vomiting. 20 tablet 0   potassium chloride  (KLOR-CON  M) 10 MEQ tablet Take 1 tablet (10 mEq total) by mouth daily. 14 tablet 0   ramipril (ALTACE) 10 MG capsule Take 10 mg by mouth 2 (two) times daily.     No current facility-administered medications for this visit.    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, estrogen receptor positive (HCC)  05/14/2022 Cancer Staging   Staging form: Breast, AJCC 8th Edition - Clinical stage from 05/14/2022: Stage 0 (cTis (DCIS), cN0, cM0,  G2, ER+, PR+, HER2: Not Assessed) - Signed by Lanny Callander, MD on 05/27/2022 Stage prefix: Initial diagnosis Histologic grading system: 3 grade system   07/31/2022 Cancer Staging   Staging form: Breast, AJCC 8th Edition - Pathologic stage from 07/31/2022: Stage IA (pT1b, pN0, cM0, G2, ER+, PR+, HER2-) - Signed by Lanny Callander, MD on 08/26/2022 Stage prefix: Initial diagnosis Histologic grading system: 3 grade system   07/31/2022 Surgery   Right mastectomy: 6 mm IDC, grade 2, and DCIS.  Surgical margins were negative, lymph nodes were negative.    07/31/2022 Oncotype testing   21/7%   08/2022 -  Anti-estrogen oral therapy   Anastrozole  daily       REVIEW OF SYSTEMS:   Constitutional: Denies fevers, chills or abnormal weight loss Eyes: Denies blurriness of vision Ears, nose, mouth, throat, and face: Denies mucositis or sore throat Respiratory: Denies cough, dyspnea or wheezes Cardiovascular: Denies palpitation, chest  discomfort or lower extremity swelling Gastrointestinal:  Denies nausea, heartburn or change in bowel habits Skin: Denies abnormal skin rashes Lymphatics: Denies new lymphadenopathy or easy bruising Neurological:Denies numbness, tingling or new weaknesses Behavioral/Psych: Mood is stable, no new changes  All other systems were reviewed with the patient and are negative.   VITALS:   Today's Vitals   11/30/23 1351  BP: 132/78  Pulse: 64  Resp: 17  Temp: 97.8 F (36.6 C)  SpO2: 97%  Weight: 145 lb (65.8 kg)   Body mass index is 26.52 kg/m.   Wt Readings from Last 3 Encounters:  11/30/23 145 lb (65.8 kg)  11/16/23 144 lb 4 oz (65.4 kg)  08/17/23 143 lb 8 oz (65.1 kg)    Body mass index is 26.52 kg/m.  Performance status (ECOG): 1 - Symptomatic but completely ambulatory  PHYSICAL EXAM:   GENERAL:alert, no distress and comfortable SKIN: skin color, texture, turgor are normal, no rashes or significant lesions EYES: normal, Conjunctiva are pink and non-injected, sclera clear OROPHARYNX:no exudate, no erythema and lips, buccal mucosa, and tongue normal  NECK: supple, thyroid  normal size, non-tender, without nodularity LYMPH:  no palpable lymphadenopathy in the cervical, axillary or inguinal LUNGS: clear to auscultation and percussion with normal breathing effort HEART: regular rate & rhythm and no murmurs and no lower extremity edema ABDOMEN:abdomen soft, non-tender and normal bowel sounds Musculoskeletal:no cyanosis of digits and no clubbing  NEURO: alert & oriented x 3 with fluent speech, no focal motor/sensory deficits BREAST: Intact surgical implant noted in the right breast.  No palpable lumps or masses around the right chest wall.  There is no axillary lymphadenopathy on the right.  There are no palpable lumps or masses in the left breast.  There is no nipple inversion or nipple discharge.  There is no axillary lymphadenopathy on the left side.  LABORATORY DATA:  I have  reviewed the data as listed    Component Value Date/Time   NA 135 11/30/2023 1318   K 4.1 11/30/2023 1318   CL 97 (L) 11/30/2023 1318   CO2 31 11/30/2023 1318   GLUCOSE 107 (H) 11/30/2023 1318   BUN 18 11/30/2023 1318   CREATININE 0.84 11/30/2023 1318   CALCIUM  10.4 (H) 11/30/2023 1318   PROT 7.4 11/30/2023 1318   ALBUMIN 4.4 11/30/2023 1318   AST 41 11/30/2023 1318   ALT 69 (H) 11/30/2023 1318   ALKPHOS 77 11/30/2023 1318   BILITOT 0.6 11/30/2023 1318   GFRNONAA >60 11/30/2023 1318   GFRAA >60 07/19/2018 1222     Lab  Results  Component Value Date   WBC 5.7 11/30/2023   NEUTROABS 1.9 11/30/2023   HGB 15.3 (H) 11/30/2023   HCT 43.8 11/30/2023   MCV 88.7 11/30/2023   PLT 191 11/30/2023

## 2023-11-30 ENCOUNTER — Inpatient Hospital Stay: Payer: Medicare HMO | Attending: Nurse Practitioner

## 2023-11-30 ENCOUNTER — Encounter (HOSPITAL_BASED_OUTPATIENT_CLINIC_OR_DEPARTMENT_OTHER): Payer: Self-pay

## 2023-11-30 ENCOUNTER — Inpatient Hospital Stay: Payer: Medicare HMO | Admitting: Nurse Practitioner

## 2023-11-30 VITALS — BP 132/78 | HR 64 | Temp 97.8°F | Resp 17 | Wt 145.0 lb

## 2023-11-30 DIAGNOSIS — Z79810 Long term (current) use of selective estrogen receptor modulators (SERMs): Secondary | ICD-10-CM | POA: Insufficient documentation

## 2023-11-30 DIAGNOSIS — Z170 Estrogen receptor positive status [ER+]: Secondary | ICD-10-CM | POA: Insufficient documentation

## 2023-11-30 DIAGNOSIS — Z9011 Acquired absence of right breast and nipple: Secondary | ICD-10-CM | POA: Insufficient documentation

## 2023-11-30 DIAGNOSIS — R7401 Elevation of levels of liver transaminase levels: Secondary | ICD-10-CM | POA: Insufficient documentation

## 2023-11-30 DIAGNOSIS — Z1721 Progesterone receptor positive status: Secondary | ICD-10-CM | POA: Insufficient documentation

## 2023-11-30 DIAGNOSIS — Z1732 Human epidermal growth factor receptor 2 negative status: Secondary | ICD-10-CM | POA: Diagnosis not present

## 2023-11-30 DIAGNOSIS — C50411 Malignant neoplasm of upper-outer quadrant of right female breast: Secondary | ICD-10-CM | POA: Diagnosis not present

## 2023-11-30 LAB — CBC WITH DIFFERENTIAL (CANCER CENTER ONLY)
Abs Immature Granulocytes: 0.01 K/uL (ref 0.00–0.07)
Basophils Absolute: 0.1 K/uL (ref 0.0–0.1)
Basophils Relative: 1 %
Eosinophils Absolute: 0.7 K/uL — ABNORMAL HIGH (ref 0.0–0.5)
Eosinophils Relative: 11 %
HCT: 43.8 % (ref 36.0–46.0)
Hemoglobin: 15.3 g/dL — ABNORMAL HIGH (ref 12.0–15.0)
Immature Granulocytes: 0 %
Lymphocytes Relative: 44 %
Lymphs Abs: 2.4 K/uL (ref 0.7–4.0)
MCH: 31 pg (ref 26.0–34.0)
MCHC: 34.9 g/dL (ref 30.0–36.0)
MCV: 88.7 fL (ref 80.0–100.0)
Monocytes Absolute: 0.6 K/uL (ref 0.1–1.0)
Monocytes Relative: 11 %
Neutro Abs: 1.9 K/uL (ref 1.7–7.7)
Neutrophils Relative %: 33 %
Platelet Count: 191 K/uL (ref 150–400)
RBC: 4.94 MIL/uL (ref 3.87–5.11)
RDW: 14.3 % (ref 11.5–15.5)
WBC Count: 5.7 K/uL (ref 4.0–10.5)
nRBC: 0 % (ref 0.0–0.2)

## 2023-11-30 LAB — CMP (CANCER CENTER ONLY)
ALT: 69 U/L — ABNORMAL HIGH (ref 0–44)
AST: 41 U/L (ref 15–41)
Albumin: 4.4 g/dL (ref 3.5–5.0)
Alkaline Phosphatase: 77 U/L (ref 38–126)
Anion gap: 7 (ref 5–15)
BUN: 18 mg/dL (ref 8–23)
CO2: 31 mmol/L (ref 22–32)
Calcium: 10.4 mg/dL — ABNORMAL HIGH (ref 8.9–10.3)
Chloride: 97 mmol/L — ABNORMAL LOW (ref 98–111)
Creatinine: 0.84 mg/dL (ref 0.44–1.00)
GFR, Estimated: >60 mL/min (ref >60)
Glucose, Bld: 107 mg/dL — ABNORMAL HIGH (ref 70–99)
Potassium: 4.1 mmol/L (ref 3.5–5.1)
Sodium: 135 mmol/L (ref 135–145)
Total Bilirubin: 0.6 mg/dL (ref 0.0–1.2)
Total Protein: 7.4 g/dL (ref 6.5–8.1)

## 2023-12-03 ENCOUNTER — Encounter (HOSPITAL_BASED_OUTPATIENT_CLINIC_OR_DEPARTMENT_OTHER): Payer: Self-pay

## 2023-12-06 ENCOUNTER — Encounter: Payer: Self-pay | Admitting: Nurse Practitioner

## 2023-12-06 NOTE — Assessment & Plan Note (Addendum)
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 -She underwent a right mastectomy on July 31, 2022.  I reviewed her surgical pathology findings with her in detail, it showed 6 mm invasive ductal carcinoma, grade 2, and DCIS.  Surgical margins were negative, lymph nodes were negative. -We discussed her Oncotype results, which showed recurrence score of 21.  This is considered low risk disease, adjuvant chemotherapy is not recommended. -She does not need postmastectomy radiation  -She started adjuvant anastrozole  in July 2024, she stopped in August 2024 due to mood swings and depression. She started Tamoxifen  10mg  dialy in Sep 2024, held in 02/2023 due to transaminitis, and to restart in December after liver function improved.  -- 11/30/2023 -tamoxifen  still being held due to elevated liver functions.  Additional testing scheduled for later in August 2025. - Continue breast cancer surveillance with labs and follow-up in 6 months, sooner if needed.  Unilateral left mammogram to be scheduled in January 2026.

## 2023-12-08 ENCOUNTER — Other Ambulatory Visit (HOSPITAL_BASED_OUTPATIENT_CLINIC_OR_DEPARTMENT_OTHER): Payer: Self-pay | Admitting: Family Medicine

## 2023-12-08 DIAGNOSIS — E782 Mixed hyperlipidemia: Secondary | ICD-10-CM

## 2023-12-14 DIAGNOSIS — Z79899 Other long term (current) drug therapy: Secondary | ICD-10-CM | POA: Diagnosis not present

## 2023-12-14 DIAGNOSIS — E1169 Type 2 diabetes mellitus with other specified complication: Secondary | ICD-10-CM | POA: Diagnosis not present

## 2023-12-14 DIAGNOSIS — E871 Hypo-osmolality and hyponatremia: Secondary | ICD-10-CM | POA: Diagnosis not present

## 2023-12-23 DIAGNOSIS — N898 Other specified noninflammatory disorders of vagina: Secondary | ICD-10-CM | POA: Diagnosis not present

## 2023-12-23 DIAGNOSIS — Z01419 Encounter for gynecological examination (general) (routine) without abnormal findings: Secondary | ICD-10-CM | POA: Diagnosis not present

## 2023-12-25 ENCOUNTER — Ambulatory Visit (HOSPITAL_BASED_OUTPATIENT_CLINIC_OR_DEPARTMENT_OTHER)
Admission: RE | Admit: 2023-12-25 | Discharge: 2023-12-25 | Disposition: A | Discharge status: Home / Self Care | Payer: Self-pay | Source: Ambulatory Visit | Attending: Family Medicine | Admitting: Family Medicine

## 2023-12-25 DIAGNOSIS — E782 Mixed hyperlipidemia: Secondary | ICD-10-CM | POA: Insufficient documentation

## 2024-01-12 ENCOUNTER — Telehealth: Payer: Self-pay | Admitting: Physician Assistant

## 2024-01-12 DIAGNOSIS — Z9011 Acquired absence of right breast and nipple: Secondary | ICD-10-CM

## 2024-01-12 NOTE — Telephone Encounter (Signed)
 Is the patient authorized for nipple tattoo?

## 2024-01-26 ENCOUNTER — Ambulatory Visit: Admitting: Physician Assistant

## 2024-01-26 DIAGNOSIS — E119 Type 2 diabetes mellitus without complications: Secondary | ICD-10-CM | POA: Diagnosis not present

## 2024-02-10 NOTE — Progress Notes (Unsigned)
 NIPPLE AREOLAR TATTOO PROCEDURE  PREOPERATIVE DIAGNOSIS:  Acquired absence of right nipple areolar   POSTOPERATIVE DIAGNOSIS: Acquired absence of right nipple areolar    PROCEDURES: Right nipple areolar tattoo   ANESTHESIA:  EMLA, 4% Lidocaine  with epinephrine   COMPLICATIONS: None.  JUSTIFICATION FOR PROCEDURE:  Meghan Mcdonald is a 69 y.o. female with a history of right breast cancer and unilateral mastectomy with latissimus myocutaneous flap reconstruction now s/p right implant placement with contralateral breast mastopexy for symmetry 05/2023. The patient presents for nipple areolar complex tattoo. Risks, benefits, indications, and alternatives of the above described procedures were discussed with the patient and all the patient's questions were answered.   02/11/2024: Today, patient is doing well.  She is prepared for nipple areolar tattoo restoration.  Her left NAC is a bit hypopigmented which will likely be the most challenging aspect.  Her breasts have additively good shape and symmetry.  Left breast is just slightly more ptotic than the right side.  Will try to compensate with placing areola a bit lower on the reconstructed breast.  This is also preferred if the implant settles further.  DESCRIPTION OF PROCEDURE: After written informed consent was obtained and proper identification of patient and surgical site was made, the patient was taken to the procedure room and placed supine on the operating room table. A time out was performed to confirm patient's identity and surgical site. The patient was prepped and draped in the usual sterile fashion. Alcohol swabs were used. Attention was turned to the selection of flesh colored permanent tattoo ink to produce the appropriate color for nipple areolar complex tattooing.  Nipple position was then chosen with patient.  Using a digital revo 7R tattoo head, pointillism pigment was instilled to the designed nipple areolar complex which was confirmed  preoperatively with the patient using the Digital-Pop Deluxe machine by [employer]. Spiral technique was used to fill out the areola. Once adequate pigment had been applied to the nipple areolar complex, Xeroform dressing was applied followed by 4 x 4 gauze and secured with Medipore tape. There were no complications and the procedure was tolerated without difficulty.    Areola-color: Dotti Cockayne Areola 1 to [employer] in a 2:3 ratio. Additional: FM Nipple to [employer] in a 1:5 ratio.   Colors EXP: 06/2024  Nipples are each approximately 4.5 (L) x 4 (W) cm diameter at conclusion of visit. Return in 8-12 weeks for 1.5-hour tattoo for repeat areolar and nipple saturation.  Telephone visit to assess feeding in interim.

## 2024-02-11 ENCOUNTER — Ambulatory Visit: Admitting: Physician Assistant

## 2024-02-11 DIAGNOSIS — Z853 Personal history of malignant neoplasm of breast: Secondary | ICD-10-CM | POA: Diagnosis not present

## 2024-02-11 DIAGNOSIS — Z9011 Acquired absence of right breast and nipple: Secondary | ICD-10-CM

## 2024-02-11 DIAGNOSIS — Z170 Estrogen receptor positive status [ER+]: Secondary | ICD-10-CM

## 2024-02-15 ENCOUNTER — Ambulatory Visit: Attending: Hematology

## 2024-02-15 VITALS — Wt 143.0 lb

## 2024-02-15 DIAGNOSIS — Z483 Aftercare following surgery for neoplasm: Secondary | ICD-10-CM | POA: Insufficient documentation

## 2024-02-15 NOTE — Therapy (Signed)
 OUTPATIENT PHYSICAL THERAPY SOZO SCREENING NOTE   Patient Name: Meghan Mcdonald MRN: [id number] DOB:1956/03/08, 68 y.o., female Today's Date: 02/15/2024  PCP: Cindy Clotilda HERO, DO REFERRING PROVIDER: Lanny Callander, MD   PT End of Session - 02/15/24 [phone]     Visit Number 8   # unchanged due to screen only   PT Start Time 0903    PT Stop Time 0907    PT Time Calculation (min) 4 min    Activity Tolerance Patient tolerated treatment well    Behavior During Therapy WFL for tasks assessed/performed          Past Medical History:  Diagnosis Date   Anemia    years ago   Anxiety    Arthritis    Breast cancer (HCC)    Chronic shoulder pain    COVID    x 2 both mild cases   Depression    Diabetes mellitus without complication (HCC)    GERD (gastroesophageal reflux disease)    History of kidney stones    Hyperlipemia    Hypertension    Insomnia    Lichen sclerosus of vulva    Migraine    Overweight    PONV (postoperative nausea and vomiting)    Rapid palpitations    Ventricular tachycardia (paroxysmal) (HCC)    Past Surgical History:  Procedure Laterality Date   BLADDER SURGERY     BREAST IMPLANT REMOVAL Right 09/19/2022   Procedure: REMOVAL RIGHT TISSUE EXPANDER;  Surgeon: Lowery Estefana RAMAN, DO;  Location: [facility];  Service: Plastics;  Laterality: Right;   BREAST RECONSTRUCTION WITH PLACEMENT OF TISSUE EXPANDER AND FLEX HD (ACELLULAR HYDRATED DERMIS) Right 07/31/2022   Procedure: IMMEDIATE RIGHT BREAST RECONSTRUCTION WITH PLACEMENT OF TISSUE EXPANDER AND FLEX HD (ACELLULAR HYDRATED DERMIS);  Surgeon: Lowery Estefana RAMAN, DO;  Location: [facility];  Service: Plastics;  Laterality: Right;   CARPAL TUNNEL RELEASE     CATARACT EXTRACTION Bilateral    CHOLECYSTECTOMY     LATISSIMUS FLAP TO BREAST Right 03/26/2023   Procedure: right latissimus muscle flap with expander placement;  Surgeon: Lowery Estefana RAMAN, DO;  Location: [facility];  Service: Plastics;  Laterality:  Right;   MASTECTOMY W/ SENTINEL NODE BIOPSY Right 07/31/2022   Procedure: RIGHT MASTECTOMY WITH SENTINEL LYMPH NODE BIOPSY;  Surgeon: Curvin Deward MOULD, MD;  Location: [facility];  Service: General;  Laterality: Right;   ORIF ANKLE FRACTURE Right 07/22/2018   Procedure: OPEN REDUCTION INTERNAL FIXATION (ORIF) RIGHT BIMALL ANKLE FRACTURE;  Surgeon: Elsa Lonni SAUNDERS, MD;  Location:  SUR[facility]ERY C[facility];  Service: Orthopedics;  Laterality: Right;   ORIF TIBIA & FIBULA FRACTURES     REMOVAL OF TISSUE EXPANDER AND PLACEMENT OF IMPLANT Right 06/17/2023   Procedure: REMOVAL OF RIGHT TISSUE EXPANDER AND PLACEMENT OF IMPLANT WITH LEFT MASTOPEXY;  Surgeon: Lowery Estefana RAMAN, DO;  Location: [facility];  Service: Plastics;  Laterality: Right;   TONSILLECTOMY     Patient Active Problem List   Diagnosis Date Noted   Hx of breast cancer 03/26/2023   Acquired absence of right breast 03/26/2023   Postoperative breast asymmetry 12/19/2022   S/P breast reconstruction 07/31/2022   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, estrogen receptor positive (HCC) 05/26/2022   Acute CVA (cerebrovascular accident) (HCC) 12/23/2017   Severe vertigo 12/22/2017   Vertigo 12/22/2017   Dyslipidemia 12/22/2017   Essential hypertension 12/22/2017   Nausea & vomiting 12/22/2017    REFERRING DIAG: right breast cancer  at risk for lymphedema  THERAPY DIAG: Aftercare following surgery for neoplasm  PERTINENT HISTORY: Anemia, anxiety, breast CA s/p R mastectomy with 2 LN's removed 07/31/22 and implant removal 09/09/22, depression, DM, GERD, HLD, HTN, migraine, palpitations, R ankle ORIF   PRECAUTIONS: right UE Lymphedema risk, None  SUBJECTIVE: Pt returns for her 3 month L-Dex screen. I got the first part of my areolar and nipple tattoo last week. The PA is doing it for me.  PAIN:  Are you having pain? No  SOZO SCREENING: Patient was assessed today using the SOZO machine to  determine the lymphedema index score. This was compared to her baseline score. It was determined that she is within the recommended range when compared to her baseline and no further action is needed at this time. She will continue SOZO screenings. These are done every 3 months for 2 years post operatively followed by every 6 months for 2 years, and then annually.    L-DEX FLOWSHEETS - 02/15/24 0900       L-DEX LYMPHEDEMA SCREENING   Measurement Type Unilateral    L-DEX MEASUREMENT EXTREMITY Upper Extremity    POSITION  Standing    DOMINANT SIDE Right    At Risk Side Right    BASELINE SCORE (UNILATERAL) -4    L-DEX SCORE (UNILATERAL) 2.4    VALUE CHANGE (UNILAT) 6.4         P: Pt to return on 1 month due to near high change from baseline and she wants to see if it will reduce or increase. She is going to wear her compression sleeve x 1 month as well for extra precaution.    Aden Berwyn Caldron, PTA 02/15/2024, 9:09 AM

## 2024-02-16 DIAGNOSIS — L821 Other seborrheic keratosis: Secondary | ICD-10-CM | POA: Diagnosis not present

## 2024-02-16 DIAGNOSIS — D485 Neoplasm of uncertain behavior of skin: Secondary | ICD-10-CM | POA: Diagnosis not present

## 2024-02-16 DIAGNOSIS — L814 Other melanin hyperpigmentation: Secondary | ICD-10-CM | POA: Diagnosis not present

## 2024-02-16 DIAGNOSIS — D3617 Benign neoplasm of peripheral nerves and autonomic nervous system of trunk, unspecified: Secondary | ICD-10-CM | POA: Diagnosis not present

## 2024-02-16 DIAGNOSIS — I788 Other diseases of capillaries: Secondary | ICD-10-CM | POA: Diagnosis not present

## 2024-02-16 DIAGNOSIS — Z85828 Personal history of other malignant neoplasm of skin: Secondary | ICD-10-CM | POA: Diagnosis not present

## 2024-02-16 DIAGNOSIS — L820 Inflamed seborrheic keratosis: Secondary | ICD-10-CM | POA: Diagnosis not present

## 2024-02-16 DIAGNOSIS — D225 Melanocytic nevi of trunk: Secondary | ICD-10-CM | POA: Diagnosis not present

## 2024-02-29 ENCOUNTER — Ambulatory Visit: Admitting: Cardiology

## 2024-03-01 ENCOUNTER — Telehealth: Payer: Self-pay | Admitting: Physician Assistant

## 2024-03-01 NOTE — Telephone Encounter (Signed)
 I called to reschedule appointment due to Meghan Mcdonald being in surgery that morning. lvm

## 2024-03-07 ENCOUNTER — Ambulatory Visit: Admitting: Internal Medicine

## 2024-03-10 ENCOUNTER — Telehealth: Payer: Self-pay | Admitting: Physician Assistant

## 2024-03-14 ENCOUNTER — Ambulatory Visit (INDEPENDENT_AMBULATORY_CARE_PROVIDER_SITE_OTHER): Payer: Self-pay | Admitting: Physician Assistant

## 2024-03-14 DIAGNOSIS — C50411 Malignant neoplasm of upper-outer quadrant of right female breast: Secondary | ICD-10-CM

## 2024-03-14 DIAGNOSIS — Z9011 Acquired absence of right breast and nipple: Secondary | ICD-10-CM

## 2024-03-14 DIAGNOSIS — Z170 Estrogen receptor positive status [ER+]: Secondary | ICD-10-CM

## 2024-03-14 NOTE — Progress Notes (Signed)
 Patient is a pleasant 68 year old female with a history of right breast cancer and unilateral mastectomy with latissimus myocutaneous flap reconstruction now s/p right implant placement with contralateral breast mastopexy for symmetry performed 05/2023 who is now s/p right nipple areolar tattoo restoration performed 02/11/2024 who joins via telephone for postprocedural check-in.  Today, patient is doing well.  She is pleased with the outcome of her tattoo.  She states that it has not faded as much as she had anticipated.  The color is excellent and matches her contralateral (native) areola nicely.  She is however eager to proceed with an additional tattoo treatment.  Will plan to have her return in 4 to 5 weeks for repeat areolar tattoo restoration.  Patient to call the office if she have any concerns in the interim.

## 2024-03-21 ENCOUNTER — Ambulatory Visit: Attending: Hematology

## 2024-03-21 VITALS — Wt 142.5 lb

## 2024-03-21 DIAGNOSIS — Z483 Aftercare following surgery for neoplasm: Secondary | ICD-10-CM | POA: Insufficient documentation

## 2024-03-21 NOTE — Therapy (Signed)
 OUTPATIENT PHYSICAL THERAPY SOZO SCREENING NOTE   Patient Name: Meghan Mcdonald MRN: [id number] DOB:1955/10/25, 68 y.o., female Today's Date: 03/21/2024  PCP: Cindy Clotilda HERO, DO REFERRING PROVIDER: Lanny Callander, MD   PT End of Session - 03/21/24 [phone]     Visit Number 8   # unchanged due to screen only   PT Start Time 0946    PT Stop Time 0950    PT Time Calculation (min) 4 min    Activity Tolerance Patient tolerated treatment well    Behavior During Therapy WFL for tasks assessed/performed          Past Medical History:  Diagnosis Date   Anemia    years ago   Anxiety    Arthritis    Breast cancer (HCC)    Chronic shoulder pain    COVID    x 2 both mild cases   Depression    Diabetes mellitus without complication (HCC)    GERD (gastroesophageal reflux disease)    History of kidney stones    Hyperlipemia    Hypertension    Insomnia    Lichen sclerosus of vulva    Migraine    Overweight    PONV (postoperative nausea and vomiting)    Rapid palpitations    Ventricular tachycardia (paroxysmal) (HCC)    Past Surgical History:  Procedure Laterality Date   BLADDER SURGERY     BREAST IMPLANT REMOVAL Right 09/19/2022   Procedure: REMOVAL RIGHT TISSUE EXPANDER;  Surgeon: Lowery Estefana RAMAN, DO;  Location: [facility];  Service: Plastics;  Laterality: Right;   BREAST RECONSTRUCTION WITH PLACEMENT OF TISSUE EXPANDER AND FLEX HD (ACELLULAR HYDRATED DERMIS) Right 07/31/2022   Procedure: IMMEDIATE RIGHT BREAST RECONSTRUCTION WITH PLACEMENT OF TISSUE EXPANDER AND FLEX HD (ACELLULAR HYDRATED DERMIS);  Surgeon: Lowery Estefana RAMAN, DO;  Location: [facility];  Service: Plastics;  Laterality: Right;   CARPAL TUNNEL RELEASE     CATARACT EXTRACTION Bilateral    CHOLECYSTECTOMY     LATISSIMUS FLAP TO BREAST Right 03/26/2023   Procedure: right latissimus muscle flap with expander placement;  Surgeon: Lowery Estefana RAMAN, DO;  Location: [facility];  Service: Plastics;  Laterality:  Right;   MASTECTOMY W/ SENTINEL NODE BIOPSY Right 07/31/2022   Procedure: RIGHT MASTECTOMY WITH SENTINEL LYMPH NODE BIOPSY;  Surgeon: Curvin Deward MOULD, MD;  Location: [facility];  Service: General;  Laterality: Right;   ORIF ANKLE FRACTURE Right 07/22/2018   Procedure: OPEN REDUCTION INTERNAL FIXATION (ORIF) RIGHT BIMALL ANKLE FRACTURE;  Surgeon: Elsa Lonni SAUNDERS, MD;  Location: [facility];  Service: Orthopedics;  Laterality: Right;   ORIF TIBIA & FIBULA FRACTURES     REMOVAL OF TISSUE EXPANDER AND PLACEMENT OF IMPLANT Right 06/17/2023   Procedure: REMOVAL OF RIGHT TISSUE EXPANDER AND PLACEMENT OF IMPLANT WITH LEFT MASTOPEXY;  Surgeon: Lowery Estefana RAMAN, DO;  Location: [facility];  Service: Plastics;  Laterality: Right;   TONSILLECTOMY     Patient Active Problem List   Diagnosis Date Noted   Hx of breast cancer 03/26/2023   Acquired absence of right breast 03/26/2023   Postoperative breast asymmetry 12/19/2022   S/P breast reconstruction 07/31/2022   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, estrogen receptor positive (HCC) 05/26/2022   Acute CVA (cerebrovascular accident) (HCC) 12/23/2017   Severe vertigo 12/22/2017   Vertigo 12/22/2017   Dyslipidemia 12/22/2017   Essential hypertension 12/22/2017   Nausea & vomiting 12/22/2017    REFERRING DIAG: right breast cancer  at risk for lymphedema  THERAPY DIAG: Aftercare following surgery for neoplasm  PERTINENT HISTORY: Anemia, anxiety, breast CA s/p R mastectomy with 2 LN's removed 07/31/22 and implant removal 09/09/22, depression, DM, GERD, HLD, HTN, migraine, palpitations, R ankle ORIF   PRECAUTIONS: right UE Lymphedema risk, None  SUBJECTIVE: Pt returns for her 3 month L-Dex screen. I wore my sleeve most days since I was here last. I have my next tattoo for my nipple/areolar in Jan.   PAIN:  Are you having pain? No  SOZO SCREENING: Patient was assessed today using the SOZO  machine to determine the lymphedema index score. This was compared to her baseline score. It was determined that she is within the recommended range when compared to her baseline and no further action is needed at this time. She will continue SOZO screenings. These are done every 3 months for 2 years post operatively followed by every 6 months for 2 years, and then annually.  Advised pt to wear her sleeve for about 2 weeks after her next tattoo. She agreed.    L-DEX FLOWSHEETS - 03/21/24 0900       L-DEX LYMPHEDEMA SCREENING   Measurement Type Unilateral    L-DEX MEASUREMENT EXTREMITY Upper Extremity    POSITION  Standing    DOMINANT SIDE Right    At Risk Side Right    BASELINE SCORE (UNILATERAL) -4    L-DEX SCORE (UNILATERAL) -1.6    VALUE CHANGE (UNILAT) 2.4         P: Pt to resume every 3 month L-Dex screens until 07/2024, then transition to every 6 months until 07/2026. SABRA    Aden Berwyn Caldron, PTA 03/21/2024, 9:50 AM

## 2024-03-28 ENCOUNTER — Ambulatory Visit: Attending: Internal Medicine | Admitting: Internal Medicine

## 2024-03-28 VITALS — BP 130/77 | HR 73

## 2024-03-28 DIAGNOSIS — E785 Hyperlipidemia, unspecified: Secondary | ICD-10-CM | POA: Diagnosis not present

## 2024-03-28 DIAGNOSIS — R011 Cardiac murmur, unspecified: Secondary | ICD-10-CM | POA: Diagnosis not present

## 2024-03-28 DIAGNOSIS — I10 Essential (primary) hypertension: Secondary | ICD-10-CM

## 2024-03-28 NOTE — Progress Notes (Signed)
 Cardiology Office Note:  .    Date:  03/28/2024  ID:  Meghan Mcdonald, DOB 10/20/1955, MRN [id number] PCP: Cindy Clotilda HERO, DO  [facility] Providers Cardiologist:  Stanly DELENA Leavens, MD     CC: Secondary Prevention  Consulted for the evaluation of CAC and HLD at the behest of   History of Present Illness: Meghan Mcdonald    Meghan Mcdonald is a 68 y.o. female with hyperlipidemia who presents with an elevated calcium  score.  She has been on atorvastatin  80 mg for nearly 20 years, recently reduced to 40 mg due to elevated liver enzymes. Her LDL has been maintained under 70. No current chest pain, shortness of breath, arm pain, or jaw pain. She has a significant family history of cardiovascular disease, with both parents having had heart attacks and bypass surgeries in their sixties, along with high cholesterol, hypertension, and diabetes.  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 She underwent multiple surgeries related to her breast cancer from April 2024 to February 2025, including mastectomy, expander removal, incision revision, and grafting from her back. She has since started feeling better and has begun an exercise class focusing on cardiovascular and weight-based training, which also includes balance exercises.  In 2019, she experienced severe vertigo, vomiting, and inability to walk, leading to an ER visit where dehydration was treated. An MRI suggested a possible stroke, but a subsequent CT with contrast ruled it out, and a viral infection in the middle ear was suspected. She underwent therapy, and symptoms mostly resolved, with only minor episodes since, managed with Valium  as needed.  She reports sensitivity to aspirin , causing easy bruising, but no severe bleeding. She has not taken aspirin  routinely for years, only temporarily after surgeries.  Discussed the use of AI scribe software for  clinical note transcription with the patient, who gave verbal consent to proceed.   Relevant histories: .  Social  - Retired Engineer, Civil (consulting) - Friends with the [employer] Family ROS: As per HPI.   Studies Reviewed: .     Cardiac Studies & Procedures   ______________________________________________________________________________________________     ECHOCARDIOGRAM  ECHOCARDIOGRAM COMPLETE 12/23/2017  Narrative *[place]* *[facility]* [address] [place], [place] [address] [phone]  ------------------------------------------------------------------- Transthoracic Echocardiography  Patient:    Mcdonald, Meghan MR #:       [id number] Study Date: 12/23/2017 Gender:     F Age:        29 Height:     157.5 cm Weight:     77.1 kg BSA:        1.87 m^2 Pt. Status: Room:       5C09C  TISA Rosario Leatrice LILLETTE REFERRING    Rosario Leatrice LILLETTE BERNIS Kara Dave [id number] ATTENDING    Dreama Longs PERFORMING   [facility], Inpatient SONOGRAPHER  Augustin Seals  cc:  ------------------------------------------------------------------- LV EF: 60% -   65%  ------------------------------------------------------------------- Indications:      CVA 436.  ------------------------------------------------------------------- History:   Risk factors:  Hypertension. Dyslipidemia.  ------------------------------------------------------------------- Study Conclusions  - Left ventricle: The cavity size was normal. Systolic function was normal. The estimated ejection fraction was in the range of 60% to 65%. Wall motion was normal; there were no regional wall motion abnormalities. The study is indeterminate for the evaluation of LV diastolic function. - Aortic valve: There was mild regurgitation. - Mitral valve: There was no significant regurgitation. -  Right ventricle: Systolic function was normal. - Atrial septum: Poorly visualized. A patent foramen ovale  cannot be excluded. - Tricuspid valve: There was trivial regurgitation. - Pulmonic valve: There was no significant regurgitation.  Impressions:  - Normal LV systolic function without significant valve disease. No clear source of emboli. Atrial septum not well visualized; shunt or PFO cannot be completely excluded.  ------------------------------------------------------------------- Study data:  No prior study was available for comparison.  Study status:  Routine.  Procedure:  The patient reported no pain pre or post test. Transthoracic echocardiography. Image quality was adequate.  Study completion:  There were no complications. Transthoracic echocardiography.  M-mode, complete 2D, spectral Doppler, and color Doppler.  Birthdate:  Patient birthdate: 1955-11-07.  Age:  Patient is 68 yr old.  Sex:  Gender: female. BMI: 31.1 kg/m^2.  Blood pressure:     148/86  Patient status: Inpatient.  Study date:  Study date: 12/23/2017. Study time: 01:28 PM.  Location:  Echo laboratory.  -------------------------------------------------------------------  ------------------------------------------------------------------- Left ventricle:  The cavity size was normal. Systolic function was normal. The estimated ejection fraction was in the range of 60% to 65%. Wall motion was normal; there were no regional wall motion abnormalities. The tissue Doppler parameters were abnormal. The study is indeterminate for the evaluation of LV diastolic function.  ------------------------------------------------------------------- Aortic valve:   Trileaflet; normal thickness leaflets. Mobility was not restricted.  Doppler:  Transvalvular velocity was within the normal range. There was no stenosis. There was mild regurgitation.  ------------------------------------------------------------------- Aorta:  Aortic root: The aortic root was normal in  size.  ------------------------------------------------------------------- Mitral valve:   Structurally normal valve.   Mobility was not restricted.  Doppler:  Transvalvular velocity was within the normal range. There was no evidence for stenosis. There was no significant regurgitation.  ------------------------------------------------------------------- Left atrium:  The atrium was normal in size.  ------------------------------------------------------------------- Atrial septum:  Poorly visualized. A patent foramen ovale cannot be excluded.  ------------------------------------------------------------------- Right ventricle:  The cavity size was normal. Wall thickness was normal. Systolic function was normal.  ------------------------------------------------------------------- Pulmonic valve:    The valve appears to be grossly normal. Doppler:  Transvalvular velocity was within the normal range. There was no evidence for stenosis. There was no significant regurgitation.  ------------------------------------------------------------------- Tricuspid valve:   Structurally normal valve.    Doppler: Transvalvular velocity was within the normal range. There was trivial regurgitation.  ------------------------------------------------------------------- Pulmonary artery:   The main pulmonary artery was normal-sized. Systolic pressure could not be accurately estimated.  ------------------------------------------------------------------- Right atrium:  The atrium was normal in size.  ------------------------------------------------------------------- Pericardium:  There was no pericardial effusion.  ------------------------------------------------------------------- Systemic veins: Inferior vena cava: The vessel was normal in size.  ------------------------------------------------------------------- Measurements  Left ventricle                             Value         Reference LV ID, ED, PLAX chordal          (L)       39    mm     43 - 52 LV ID, ES, PLAX chordal                    24    mm     23 - 38 LV fx shortening, PLAX chordal             38    %      >=29 LV  PW thickness, ED                        10    mm     ---------- IVS/LV PW ratio, ED                        1            <=1.3 Stroke volume, 2D                          76    ml     ---------- Stroke volume/bsa, 2D                      41    ml/m^2 ---------- LV e&', lateral                             5     cm/s   ---------- LV E/e&', lateral                           12.32        ---------- LV e&', medial                              4.46  cm/s   ---------- LV E/e&', medial                            13.81        ---------- LV e&', average                             4.73  cm/s   ---------- LV E/e&', average                           13.02        ----------  Ventricular septum                         Value        Reference IVS thickness, ED                          10    mm     ----------  LVOT                                       Value        Reference LVOT ID, S                                 20    mm     ---------- LVOT area                                  3.14  cm^2   ---------- LVOT peak velocity, S  108   cm/s   ---------- LVOT mean velocity, S                      59.4  cm/s   ---------- LVOT VTI, S                                24.2  cm     ---------- LVOT peak gradient, S                      5     mm Hg  ----------  Aortic valve                               Value        Reference Aortic regurg pressure half-time           939   ms     ----------  Aorta                                      Value        Reference Aortic root ID, ED                         26    mm     ----------  Left atrium                                Value        Reference LA ID, A-P, ES                             31    mm     ---------- LA ID/bsa, A-P                              1.66  cm/m^2 <=2.2 LA volume, S                               32.2  ml     ---------- LA volume/bsa, S                           17.3  ml/m^2 ---------- LA volume, ES, 1-p A4C                     28.8  ml     ---------- LA volume/bsa, ES, 1-p A4C                 15.4  ml/m^2 ---------- LA volume, ES, 1-p A2C                     32.8  ml     ---------- LA volume/bsa, ES, 1-p A2C                 17.6  ml/m^2 ----------  Mitral valve  Value        Reference Mitral E-wave peak velocity                61.6  cm/s   ---------- Mitral A-wave peak velocity                76.2  cm/s   ---------- Mitral deceleration time                   225   ms     150 - 230 Mitral E/A ratio, peak                     0.8          ----------  Right atrium                               Value        Reference RA ID, S-I, ES, A4C                        40.3  mm     34 - 49 RA area, ES, A4C                 (L)       8.05  cm^2   8.3 - 19.5 RA volume, ES, A/L                         13.2  ml     ---------- RA volume/bsa, ES, A/L                     7.1   ml/m^2 ----------  Right ventricle                            Value        Reference TAPSE                                      18.3  mm     ---------- RV s&', lateral, S                          13.8  cm/s   ----------  Legend: (L)  and  (H)  mark values outside specified reference range.  ------------------------------------------------------------------- Prepared and Electronically Authenticated by  Lonni, Bridgette 2019-09-04T17:08:16      CT SCANS  CT CARDIAC SCORING (SELF PAY ONLY) 12/25/2023  Addendum 12/26/2023  9:01 PM ADDENDUM REPORT: 12/26/2023 20:58  EXAM: OVER-READ INTERPRETATION  CT CHEST  The following report is an over-read performed by radiologist Dr. CHRISTELLA. Shick of [facility], [place] on 12/26/2023. This over-read does not include interpretation of cardiac or coronary anatomy or pathology. The  coronary calcium  score interpretation by the cardiologist is attached.  COMPARISON:  None.  FINDINGS: Cardiovascular: There are no significant extracardiac vascular findings. Thoracic aortic atherosclerosis and native coronary atherosclerosis noted.  Mediastinum/Nodes: There are no enlarged lymph nodes within the visualized mediastinum.Esophagus nondilated. No hiatal hernia. Central airways are patent.  Lungs/Pleura: There is no pleural effusion. The visualized lungs appear clear.  Upper abdomen: No acute upper abdominal finding. Remote cholecystectomy. Splenic calcifications noted.  Musculoskeletal/Chest wall: Right breast reconstruction noted.  No other chest wall abnormality or asymmetry. No acute osseous finding.  IMPRESSION: 1. No significant extracardiac findings. 2. Thoracic aortic and native coronary atherosclerosis. 3. Remote cholecystectomy.  Aortic Atherosclerosis (ICD10-I70.0).   Electronically Signed By: CHRISTELLA.  Shick M.D. On: 12/26/2023 20:58  Narrative CLINICAL DATA:  Cardiovascular Disease Risk stratification  EXAM: Coronary Calcium  Score  TECHNIQUE: A gated, non-contrast computed tomography scan of the heart was performed using 2.5 mm slice thickness. Axial images were analyzed on a dedicated workstation. Calcium  scoring of the coronary arteries was performed using the Agatston method.  FINDINGS: Coronary Calcium  Score:  Left main: 0  Left anterior descending artery: 179  Left circumflex artery: 22  Right coronary artery: 4.96  Total: 206  Percentile: 89  Pericardium: Normal.  Non-cardiac: See separate report from [facility].  Aortic atherosclerosis.  IMPRESSION: 1. Coronary calcium  score of 206. This was 32 percentile for age-, race-, and sex-matched controls.  2. Aortic atherosclerosis.  RECOMMENDATIONS: Coronary artery calcium  (CAC) score is a strong predictor of incident coronary heart disease (CHD) and provides  predictive information beyond traditional risk factors. CAC scoring is reasonable to use in the decision to withhold, postpone, or initiate statin therapy in intermediate-risk or selected borderline-risk asymptomatic adults (age 71-75 years and LDL-C >=70 to <190 mg/dL) who do not have diabetes or established atherosclerotic cardiovascular disease (ASCVD).* In intermediate-risk (10-year ASCVD risk >=7.5% to <20%) adults or selected borderline-risk (10-year ASCVD risk >=5% to <7.5%) adults in whom a CAC score is measured for the purpose of making a treatment decision the following recommendations have been made:  If CAC=0, it is reasonable to withhold statin therapy and reassess in 5 to 10 years, as long as higher risk conditions are absent (diabetes mellitus, family history of premature CHD in first degree relatives (males <55 years; females <65 years), cigarette smoking, or LDL >=190 mg/dL).  If CAC is 1 to 99, it is reasonable to initiate statin therapy for patients >=19 years of age.  If CAC is >=100 or >=75th percentile, it is reasonable to initiate statin therapy at any age.  Cardiology referral should be considered for patients with CAC scores >=400 or >=75th percentile.  *2018 AHA/ACC/AACVPR/AAPA/ABC/ACPM/ADA/AGS/APhA/ASPC/NLA/PCNA Guideline on the Management of Blood Cholesterol: A Report of the American College of Cardiology/American Heart Association Task Force on Clinical Practice Guidelines. J Am Coll Cardiol. 2019;73(24):3168-3209.  Redell Shallow, MD  Electronically Signed: By: Redell Shallow M.D. On: 12/25/2023 12:41     ______________________________________________________________________________________________       Physical Exam:    VS:  BP 130/77 (BP Location: Left Arm, Patient Position: Sitting, Cuff Size: Normal)   Pulse 73   SpO2 98%    Wt Readings from Last 3 Encounters:  03/21/24 142 lb 8 oz (64.6 kg)  02/15/24 143 lb (64.9 kg)  11/30/23  145 lb (65.8 kg)    Gen: no distress  Neck: No JVD Cardiac: No Rubs or Gallops, soft, subtle systolic Murmur, RRR +2 radial pulses Respiratory: Clear to auscultation bilaterally, normal effort, normal  respiratory rate GI: Soft, nontender, non-distended  MS: No  edema;  moves all extremities Integument: Skin feels warm Neuro:  At time of evaluation, alert and oriented to person/place/time/situation  Psych: Normal affect, patient feels ok     ASSESSMENT AND PLAN: .    Aortic and coronary atherosclerosis with calcification - Aortic and coronary atherosclerosis with calcification, evidenced by a coronary artery calcium  score of 179, indicating more plaque buildup than 88% of individuals of similar age,  gender, and race. No current symptoms of chest pain, shortness of breath, or other cardiac symptoms. Asymptomatic status suggests no immediate need for invasive testing. - Continue current exercise regimen including cardiovascular and weight-based training. - Monitor for any new symptoms such as chest pain or shortness of breath. - Will consider CT scan with contrast if symptoms develop. - Discussed pros and cons of ASA (not a true allergy can give if PCI)  Aortic valve sclerosis with calcification Aortic valve sclerosis with calcification, likely without stenosis due to lack of symptoms and faint murmur. Previous echocardiogram in 2019 was normal. - Ordered echocardiogram to assess current status of aortic valve.  Hyperlipidemia Managed with atorvastatin , reduced from 80 mg to 40 mg due to elevated liver enzymes. Family history of cardiovascular disease. LDL levels previously under 55 mg/dL. Discussion of potential genetic component to elevated calcium  score. Consideration of alternative medication if liver enzyme issues persist. - Continue atorvastatin  40 mg daily. - Will review liver function tests and cholesterol levels in January from PCP labs - Will consider alternative medication such  as ezetimibe if liver enzyme issues persist.  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 No cardiotoxic chemotherapy or radiation therapy received. No need for cardio-onc eval at this time  One year with me  Stanly Leavens, MD FASE [facility] Cardiologist [facility]  [address] [place], [address] [place], [place] [address] [phone]  3:51 PM

## 2024-03-28 NOTE — Patient Instructions (Signed)
 Medication Instructions:  Your physician recommends that you continue on your current medications as directed. Please refer to the Current Medication list given to you today.  *If you need a refill on your cardiac medications before your next appointment, please call your pharmacy*  Lab Work: NONE  If you have labs (blood work) drawn today and your tests are completely normal, you will receive your results only by: MyChart Message (if you have MyChart) OR A paper copy in the mail If you have any lab test that is abnormal or we need to change your treatment, we will call you to review the results.  Testing/Procedures: Your physician has requested that you have an echocardiogram. Echocardiography is a painless test that uses sound waves to create images of your heart. It provides your doctor with information about the size and shape of your heart and how well your heart's chambers and valves are working. This procedure takes approximately one hour. There are no restrictions for this procedure. Please do NOT wear cologne, perfume, aftershave, or lotions (deodorant is allowed). Please arrive 15 minutes prior to your appointment time.  Please note: We ask at that you not bring children with you during ultrasound (echo/ vascular) testing. Due to room size and safety concerns, children are not allowed in the ultrasound rooms during exams. Our front office staff cannot provide observation of children in our lobby area while testing is being conducted. An adult accompanying a patient to their appointment will only be allowed in the ultrasound room at the discretion of the ultrasound technician under special circumstances. We apologize for any inconvenience.   Follow-Up: At [facility], you and your health needs are our priority.  As part of our continuing mission to provide you with exceptional heart care, our providers are all part of one team.  This team includes your primary Cardiologist  (physician) and Advanced Practice Providers or APPs (Physician Assistants and Nurse Practitioners) who all work together to provide you with the care you need, when you need it.  Your next appointment:   1 year(s)  Provider:   Stanly DELENA Leavens, MD

## 2024-04-28 ENCOUNTER — Ambulatory Visit: Admitting: Physician Assistant

## 2024-04-28 DIAGNOSIS — Z170 Estrogen receptor positive status [ER+]: Secondary | ICD-10-CM

## 2024-04-28 DIAGNOSIS — Z9011 Acquired absence of right breast and nipple: Secondary | ICD-10-CM

## 2024-04-28 DIAGNOSIS — Z853 Personal history of malignant neoplasm of breast: Secondary | ICD-10-CM

## 2024-04-28 NOTE — Progress Notes (Addendum)
 NIPPLE AREOLAR TATTOO PROCEDURE   PREOPERATIVE DIAGNOSIS:  Acquired absence of right nipple areolar    POSTOPERATIVE DIAGNOSIS: Acquired absence of right nipple areolar     PROCEDURES: Right nipple areolar tattoo    ANESTHESIA:  EMLA, 4% Lidocaine  with epinephrine    COMPLICATIONS: None.   JUSTIFICATION FOR PROCEDURE:  Meghan Mcdonald is a 69 y.o. female with a history of right breast cancer and unilateral mastectomy with latissimus myocutaneous flap reconstruction now s/p right implant placement with contralateral breast mastopexy for symmetry 05/2023. The patient presents for nipple areolar complex tattoo. Risks, benefits, indications, and alternatives of the above described procedures were discussed with the patient and all the patient's questions were answered.    02/11/2024: Today, patient is doing well.  She is prepared for nipple areolar tattoo restoration.  Her left NAC is a bit hypopigmented which will likely be the most challenging aspect.  Her breasts have additively good shape and symmetry.  Left breast is just slightly more ptotic than the right side.  Will try to compensate with placing areola a bit lower on the reconstructed breast.  This is also preferred if the implant settles further.  04/28/2024: Patient noticed a considerable amount of fading in the past few weeks.  However, she has otherwise done well and looking forward to repeat tattoo session.   DESCRIPTION OF PROCEDURE: After written informed consent was obtained and proper identification of patient and surgical site was made, the patient was taken to the procedure room and placed supine on the operating room table. A time out was performed to confirm patient's identity and surgical site. The patient was prepped and draped in the usual sterile fashion. Alcohol swabs were used. Attention was turned to the selection of flesh colored permanent tattoo ink to produce the appropriate color for nipple areolar complex tattooing.   Nipple position was then chosen with patient.  Using a digital revo 7R tattoo head, pointillism pigment was instilled to the designed nipple areolar complex which was confirmed preoperatively with the patient using the Digital-Pop Deluxe machine by [employer]. Spiral technique was used to fill out the areola. Once adequate pigment had been applied to the nipple areolar complex, Xeroform dressing was applied followed by 4 x 4 gauze and secured with Medipore tape. There were no complications and the procedure was tolerated without difficulty.     Areola-color: Dotti Cockayne Areola 1 to [facility] in a 2:3 ratio. Nipple: FM Shading to FM Areola 1 to FM Nipple to [facility] in a 1:3:2:5 ratio. Shading: FM Shading to FM Areola 1 to FM Nipple to [facility] in 1:1:1:3 ratio.  Colors EXP: 06/2024  Nipples are each approximately 4.5 (L) x 4 (W) cm diameter at conclusion of visit. Follow-up in 8 weeks with Dr. Lowery to discuss possible ongoing tattoo restoration.

## 2024-05-02 ENCOUNTER — Ambulatory Visit: Admitting: Physician Assistant

## 2024-05-10 ENCOUNTER — Ambulatory Visit (HOSPITAL_COMMUNITY)
Admission: RE | Admit: 2024-05-10 | Discharge: 2024-05-10 | Disposition: A | Discharge status: Home / Self Care | Source: Ambulatory Visit | Attending: Cardiology | Admitting: Cardiology

## 2024-05-10 DIAGNOSIS — R011 Cardiac murmur, unspecified: Secondary | ICD-10-CM | POA: Diagnosis not present

## 2024-05-11 LAB — ECHOCARDIOGRAM COMPLETE
Area-P 1/2: 2.97 cm2
P 1/2 time: 486 ms
S' Lateral: 1.8 cm

## 2024-05-14 ENCOUNTER — Ambulatory Visit: Payer: Self-pay | Admitting: Internal Medicine

## 2024-05-14 DIAGNOSIS — I351 Nonrheumatic aortic (valve) insufficiency: Secondary | ICD-10-CM

## 2024-05-17 ENCOUNTER — Encounter: Payer: Self-pay | Admitting: Obstetrics & Gynecology

## 2024-05-17 ENCOUNTER — Ambulatory Visit: Admitting: Obstetrics & Gynecology

## 2024-05-17 VITALS — BP 112/69 | HR 68 | Ht 62.0 in | Wt 144.0 lb

## 2024-05-17 DIAGNOSIS — Z01411 Encounter for gynecological examination (general) (routine) with abnormal findings: Secondary | ICD-10-CM

## 2024-05-17 DIAGNOSIS — L900 Lichen sclerosus et atrophicus: Secondary | ICD-10-CM

## 2024-05-17 DIAGNOSIS — Z86000 Personal history of in-situ neoplasm of breast: Secondary | ICD-10-CM

## 2024-05-17 DIAGNOSIS — N952 Postmenopausal atrophic vaginitis: Secondary | ICD-10-CM

## 2024-05-17 MED ORDER — CLOBETASOL PROPIONATE 0.05 % EX OINT: TOPICAL_OINTMENT | CUTANEOUS | 3 refills | Status: AC

## 2024-05-17 NOTE — Progress Notes (Signed)
 "  WELL-WOMAN EXAMINATION Patient name: Meghan Mcdonald MRN [id number]  Date of birth: [birth year] Chief Complaint:   Annual Exam  History of Present Illness:   Meghan Mcdonald is a 69 y.o. G28P2002 PM female with h/o breast Ca being seen today for a routine well-woman exam and the following concerns:  -pt prior [facility] patient (2018)- previously treated for LS Still using clobetasol  twice per week as needed Previously also used vaginal estrogen; however due to E+ breast Ca discontinued  Tried Tamoxifen  and Anastrazole- had significant side effects- no longer taking  Last seen by Dr. Armond @ [facility]- per pt Pap completed 2025- negative  No LMP recorded. Patient is postmenopausal.  Last pap 2018.  Last mammogram: Left only- completed Jan 2025 @ [facility]      05/17/2024   12:04 PM  Depression screen PHQ 2/9  Decreased Interest 0  Down, Depressed, Hopeless 0  PHQ - 2 Score 0  Altered sleeping 0  Tired, decreased energy 0  Change in appetite 0  Feeling bad or failure about yourself  0  Trouble concentrating 0  Moving slowly or fidgety/restless 0  Suicidal thoughts 0  PHQ-9 Score 0      Review of Systems:   Pertinent items are noted in HPI Denies any headaches, blurred vision, fatigue, shortness of breath, chest pain, abdominal pain, bowel movements, urination, or intercourse unless otherwise stated above.  Pertinent History Reviewed:  Reviewed past medical,surgical, social and family history.  Reviewed problem list, medications and allergies. Physical Assessment:   Vitals:   05/17/24 1123  BP: 112/69  Pulse: 68  Weight: 144 lb (65.3 kg)  Height: 5' 2 (1.575 m)  Body mass index is 26.34 kg/m.        Physical Examination:   General appearance - well appearing, and in no distress  Mental status - alert, oriented to person, place, and time  Psych:  She has a normal mood and affect  Skin - warm and dry, normal color, no suspicious lesions noted  Chest - effort normal, all lung  fields clear to auscultation bilaterally  Heart - normal rate and regular rhythm  Neck:  midline trachea, no thyromegaly or nodules  Breasts - right breast implant noted- no abnormalities along scar.  Left breast appear normal, no suspicious masses, no skin or nipple changes or  axillary nodes,   Abdomen - soft, nontender, nondistended, no masses or organomegaly  Pelvic - Mild diffuse erythema noted, loss of labial architecture specifically at clitoral hood and labia minora.  Narrowing of introitus  noted- with some scaling of perineum.  No discrete lesion noted.  Vagina- pink flat mucosa- no abnormal discharge or lesions **Narrow small speculum used CERVIX: normal appearing cervix without discharge or lesions, no CMT  UTERUS: uterus is felt to be normal size, shape, consistency and nontender   ADNEXA: No adnexal masses or tenderness noted.  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 -pt notes that she had 3 areas of cancer- only discovered by MRI -concern that mammogram may not be adequate for screening -advised pt to review with oncologist -not sure if insurance will cover anything other than screening mammogram- order for ultrasound placed  []  order L breast US  (prior cancer only discovered by MRI)  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   Teghan Philbin, DO Attending Obstetrician & Gynecologist, [facility], [facility]   "

## 2024-05-19 ENCOUNTER — Ambulatory Visit
Admission: RE | Admit: 2024-05-19 | Discharge: 2024-05-19 | Disposition: A | Discharge status: Home / Self Care | Source: Ambulatory Visit | Attending: Obstetrics & Gynecology | Admitting: Obstetrics & Gynecology

## 2024-05-19 DIAGNOSIS — Z86000 Personal history of in-situ neoplasm of breast: Secondary | ICD-10-CM

## 2024-05-24 ENCOUNTER — Ambulatory Visit: Payer: Self-pay | Admitting: Obstetrics & Gynecology

## 2024-06-02 ENCOUNTER — Inpatient Hospital Stay

## 2024-06-02 ENCOUNTER — Inpatient Hospital Stay: Admitting: Nurse Practitioner

## 2024-06-07 ENCOUNTER — Ambulatory Visit: Admitting: Plastic Surgery

## 2024-07-11 ENCOUNTER — Ambulatory Visit
